# Patient Record
Sex: Male | Born: 1955 | Race: Black or African American | Hispanic: No | Marital: Married | Smoking: Former smoker
Health system: Southern US, Community
[De-identification: ages and names within clinical notes are randomized; demographics above are authoritative.]

## PROBLEM LIST (undated history)

## (undated) DIAGNOSIS — I10 Essential (primary) hypertension: Secondary | ICD-10-CM

## (undated) DIAGNOSIS — E7800 Pure hypercholesterolemia, unspecified: Secondary | ICD-10-CM

## (undated) HISTORY — PX: NO PAST SURGERIES: SHX2092

---

## 2010-01-11 ENCOUNTER — Emergency Department: Payer: Self-pay | Admitting: Emergency Medicine

## 2012-01-17 ENCOUNTER — Emergency Department: Payer: Self-pay | Admitting: *Deleted

## 2013-09-13 ENCOUNTER — Emergency Department: Payer: Self-pay | Admitting: Emergency Medicine

## 2013-09-13 LAB — CBC WITH DIFFERENTIAL/PLATELET
BASOS ABS: 0.1 10*3/uL (ref 0.0–0.1)
BASOS PCT: 1 %
EOS PCT: 1.5 %
Eosinophil #: 0.1 10*3/uL (ref 0.0–0.7)
HCT: 44.7 % (ref 40.0–52.0)
HGB: 14.8 g/dL (ref 13.0–18.0)
Lymphocyte #: 3.5 10*3/uL (ref 1.0–3.6)
Lymphocyte %: 46.9 %
MCH: 31 pg (ref 26.0–34.0)
MCHC: 33.1 g/dL (ref 32.0–36.0)
MCV: 94 fL (ref 80–100)
MONO ABS: 0.8 x10 3/mm (ref 0.2–1.0)
MONOS PCT: 10.3 %
Neutrophil #: 3 10*3/uL (ref 1.4–6.5)
Neutrophil %: 40.3 %
PLATELETS: 193 10*3/uL (ref 150–440)
RBC: 4.76 10*6/uL (ref 4.40–5.90)
RDW: 14.3 % (ref 11.5–14.5)
WBC: 7.3 10*3/uL (ref 3.8–10.6)

## 2013-09-13 LAB — BASIC METABOLIC PANEL
Anion Gap: 2 — ABNORMAL LOW (ref 7–16)
BUN: 16 mg/dL (ref 7–18)
CREATININE: 1.22 mg/dL (ref 0.60–1.30)
Calcium, Total: 9.1 mg/dL (ref 8.5–10.1)
Chloride: 105 mmol/L (ref 98–107)
Co2: 32 mmol/L (ref 21–32)
EGFR (African American): >60
EGFR (Non-African Amer.): >60
Glucose: 139 mg/dL — ABNORMAL HIGH (ref 65–99)
Osmolality: 281 (ref 275–301)
POTASSIUM: 4.4 mmol/L (ref 3.5–5.1)
Sodium: 139 mmol/L (ref 136–145)

## 2013-09-13 LAB — URINALYSIS, COMPLETE
BILIRUBIN, UR: NEGATIVE
BLOOD: NEGATIVE
Bacteria: NONE SEEN
Glucose,UR: NEGATIVE mg/dL (ref 0–75)
KETONE: NEGATIVE
Nitrite: NEGATIVE
PH: 6 (ref 4.5–8.0)
PROTEIN: NEGATIVE
SPECIFIC GRAVITY: 1.023 (ref 1.003–1.030)
Squamous Epithelial: 1

## 2013-09-13 LAB — TROPONIN I

## 2018-03-12 ENCOUNTER — Encounter: Payer: Self-pay | Admitting: Family Medicine

## 2018-03-12 ENCOUNTER — Ambulatory Visit (INDEPENDENT_AMBULATORY_CARE_PROVIDER_SITE_OTHER): Payer: Self-pay | Admitting: Family Medicine

## 2018-03-12 VITALS — BP 120/80 | HR 65 | Temp 98.4°F | Ht 67.0 in | Wt 211.8 lb

## 2018-03-12 DIAGNOSIS — M47816 Spondylosis without myelopathy or radiculopathy, lumbar region: Secondary | ICD-10-CM

## 2018-03-12 DIAGNOSIS — E669 Obesity, unspecified: Secondary | ICD-10-CM

## 2018-03-12 DIAGNOSIS — Z7689 Persons encountering health services in other specified circumstances: Secondary | ICD-10-CM

## 2018-03-12 DIAGNOSIS — N138 Other obstructive and reflux uropathy: Secondary | ICD-10-CM

## 2018-03-12 DIAGNOSIS — N401 Enlarged prostate with lower urinary tract symptoms: Secondary | ICD-10-CM

## 2018-03-12 DIAGNOSIS — I10 Essential (primary) hypertension: Secondary | ICD-10-CM

## 2018-03-12 MED ORDER — AMLODIPINE BESYLATE 10 MG PO TABS: 10.0000 mg | ORAL_TABLET | Freq: Every day | ORAL | 1 refills | Status: DC

## 2018-03-12 MED ORDER — TAMSULOSIN HCL 0.4 MG PO CAPS: 0.8000 mg | ORAL_CAPSULE | Freq: Every day | ORAL | 1 refills | Status: DC

## 2018-03-12 MED ORDER — HYDROCHLOROTHIAZIDE 25 MG PO TABS: 25.0000 mg | ORAL_TABLET | Freq: Every day | ORAL | 1 refills | Status: DC

## 2018-03-12 MED ORDER — MELOXICAM 15 MG PO TABS: 15.0000 mg | ORAL_TABLET | Freq: Every day | ORAL | 2 refills | Status: DC | PRN
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
## 2018-03-12 NOTE — Patient Instructions (Addendum)
Thank you for coming to the office today.  For prostate - go ahead and double Tamsulosin (Flomax) 0.4mg  take TWO capsules every day now at same time, new rx sent - if not effective after about a month then we may need to add 2nd medication to shrink prostate, after blood tests  Blood pressure is very good. Well controlled on current medicines - no change - refills sent to pharmacy  1. For your Back Pain - I think that this is due to Muscle Spasms or strain.  2. Start with anti-inflammatory Meloxicam 15g daily with food for next 1 to 2 weeks if helping, then can use only as needed 3.  Recommend to start taking Tylenol Extra Strength 500mg  tabs - take 1 to 2 tabs per dose (max 1000mg ) every 6-8 hours for pain (take regularly, don't skip a dose for next 7 days), max 24 hour daily dose is 6 tablets or 3000mg . In the future you can repeat the same everyday Tylenol course for 1-2 weeks at a time.  - This is safe to take with anti-inflammatory medicines (meloxicam)  - We may consider other meds in the future if needed - Muscle Relaxant or possibly Gabapentin which is a nerve pain or chronic pain medicine for longer term - Lastly we can refer you to Pain Management if need other more advanced treatments  Recommend to start using heating pad on your lower back 1-2x daily for few weeks  This pain may take weeks to months to fully resolve, but hopefully it will respond to the medicine initially. All back injuries (small or serious) are slow to heal since we use our back muscles every day. Be careful with turning, twisting, lifting, sitting / standing for prolonged periods, and avoid re-injury.  If your symptoms significantly worsen with more pain, or new symptoms with weakness in one or both legs, new or different shooting leg pains, numbness in legs or groin, loss of control or retention of urine or bowel movements, please call back for advice and you may need to go directly to the Emergency  Department.  DUE for FASTING BLOOD WORK (no food or drink after midnight before the lab appointment, only water or coffee without cream/sugar on the morning of)  SCHEDULE "Lab Only" visit in the morning at the clinic for lab draw in 3-4 WEEKS   - Make sure Lab Only appointment is at about 1 week before your next appointment, so that results will be available  For Lab Results, once available within 2-3 days of blood draw, you can can log in to MyChart online to view your results and a brief explanation. Also, we can discuss results at next follow-up visit.   Please schedule a Follow-up Appointment to: Return in about 4 weeks (around 04/09/2018) for HTN, back pain, lab results, BPH.  If you have any other questions or concerns, please feel free to call the office or send a message through [place]. You may also schedule an earlier appointment if necessary.  Additionally, you may be receiving a survey about your experience at our office within a few days to 1 week by e-mail or mail. We value your feedback.  Nobie Putnam, DO [place]

## 2018-03-13 ENCOUNTER — Other Ambulatory Visit: Payer: Self-pay | Admitting: Family Medicine

## 2018-03-13 ENCOUNTER — Encounter: Payer: Self-pay | Admitting: Family Medicine

## 2018-03-13 DIAGNOSIS — N401 Enlarged prostate with lower urinary tract symptoms: Secondary | ICD-10-CM

## 2018-03-13 DIAGNOSIS — E663 Overweight: Secondary | ICD-10-CM | POA: Insufficient documentation

## 2018-03-13 DIAGNOSIS — Z1159 Encounter for screening for other viral diseases: Secondary | ICD-10-CM

## 2018-03-13 DIAGNOSIS — I10 Essential (primary) hypertension: Secondary | ICD-10-CM

## 2018-03-13 DIAGNOSIS — N138 Other obstructive and reflux uropathy: Secondary | ICD-10-CM

## 2018-03-13 DIAGNOSIS — M47816 Spondylosis without myelopathy or radiculopathy, lumbar region: Secondary | ICD-10-CM | POA: Insufficient documentation

## 2018-03-13 DIAGNOSIS — R7309 Other abnormal glucose: Secondary | ICD-10-CM

## 2018-03-13 DIAGNOSIS — E669 Obesity, unspecified: Secondary | ICD-10-CM | POA: Insufficient documentation

## 2018-03-13 DIAGNOSIS — Z114 Encounter for screening for human immunodeficiency virus [HIV]: Secondary | ICD-10-CM

## 2018-03-13 NOTE — Assessment & Plan Note (Signed)
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 Suspect likely due to muscle spasm/strain, without known injury or trauma. In setting of known chronic LBP with DJD, without prior surgery. - No red flag symptoms - Inadequate conservative therapy currently. Prior history of controlled on opiate >20 + years prior PCP  Plan: 1. Discussed management of back pain and likely presumed dx osteoarthritis - will benefit from X-rays in future to determine clarity with diagnosis and check progress - Decline to start back on hydrocodone chronic opiates at this time - Start anti-inflammatory trial with rx Meloxicam 15mg  daily wc x 1-2 weeks, then PRN 2. May use Tylenol PRN for breakthrough 3. Encouraged use of heating pad 1-2x daily for now then PRN - Future options reviewed other alternative meds such as Muscle Relaxant, Gabapentin, and possibly Tramadol (he has failed this in past) 4. Follow-up 4 weeks if not improved for re-evaluation, consider X-ray imaging, trial of PT, and possibly referral to Orthopedic

## 2018-03-13 NOTE — Assessment & Plan Note (Signed)
Well-controlled HTN - Home BP readings normal  No known complications    Plan:  1. Continue current BP regimen - Amlodipine 10mg  daily, HCTZ 25mg  daily 2. Encourage improved lifestyle - low sodium diet, regular exercise 3. Continue monitor BP outside office, bring readings to next visit, if persistently >140/90 or new symptoms notify office sooner 4. Follow-up 4 weeks with labs

## 2018-03-13 NOTE — Assessment & Plan Note (Signed)
Gradual worsening chronic BPH LUTS - AUA BPH score 11 (moderate, 03/12/18)  - On Tamsulosin 0.4mg  - limited relief now - Last PSA unavailable - will re-check in 4 weeks - Last DRE reported by prior PCP enlarged - No known personal/family history of prostate CA  Plan: 1. INCREASE Tamsulosin to DOUBLE dose x 2 of 0.4mg  = 0.4mg  daily, advised on benefits, risks, if BP low caution with sudden standing up or position change 2. Follow-up 4 weeks - other alpha vs future add Finasteride alpha blocker to reduce prostate size (note will check PSA first and keep track of change in PSA), future referral to Urology if remains uncontrolled

## 2018-04-03 ENCOUNTER — Ambulatory Visit: Payer: Self-pay | Admitting: Family Medicine

## 2018-04-08 ENCOUNTER — Other Ambulatory Visit: Payer: Self-pay

## 2018-04-12 ENCOUNTER — Ambulatory Visit: Payer: Self-pay | Admitting: Family Medicine

## 2018-04-13 LAB — HEPATITIS C ANTIBODY
HEP C AB: NONREACTIVE
SIGNAL TO CUT-OFF: 0.02 (ref <1.00)

## 2018-04-13 LAB — COMPLETE METABOLIC PANEL WITH GFR
AG Ratio: 1.8 (calc) (ref 1.0–2.5)
ALBUMIN MSPROF: 4.3 g/dL (ref 3.6–5.1)
ALKALINE PHOSPHATASE (APISO): 60 U/L (ref 40–115)
ALT: 24 U/L (ref 9–46)
AST: 22 U/L (ref 10–35)
BILIRUBIN TOTAL: 0.3 mg/dL (ref 0.2–1.2)
BUN: 9 mg/dL (ref 7–25)
CHLORIDE: 105 mmol/L (ref 98–110)
CO2: 32 mmol/L (ref 20–32)
CREATININE: 1.05 mg/dL (ref 0.70–1.25)
Calcium: 9.3 mg/dL (ref 8.6–10.3)
GFR, Est African American: 87 mL/min/{1.73_m2} (ref >=60)
GFR, Est Non African American: 75 mL/min/{1.73_m2} (ref >=60)
Globulin: 2.4 g/dL (calc) (ref 1.9–3.7)
Glucose, Bld: 117 mg/dL — ABNORMAL HIGH (ref 65–99)
Potassium: 4.3 mmol/L (ref 3.5–5.3)
SODIUM: 142 mmol/L (ref 135–146)
Total Protein: 6.7 g/dL (ref 6.1–8.1)

## 2018-04-13 LAB — CBC WITH DIFFERENTIAL/PLATELET
BASOS PCT: 0.5 %
Basophils Absolute: 19 cells/uL (ref 0–200)
EOS PCT: 1.9 %
Eosinophils Absolute: 72 cells/uL (ref 15–500)
HCT: 41.3 % (ref 38.5–50.0)
Hemoglobin: 13.8 g/dL (ref 13.2–17.1)
LYMPHS ABS: 1448 {cells}/uL (ref 850–3900)
MCH: 29.7 pg (ref 27.0–33.0)
MCHC: 33.4 g/dL (ref 32.0–36.0)
MCV: 88.8 fL (ref 80.0–100.0)
MPV: 10.8 fL (ref 7.5–12.5)
Monocytes Relative: 10.3 %
NEUTROS PCT: 49.2 %
Neutro Abs: 1870 cells/uL (ref 1500–7800)
PLATELETS: 215 10*3/uL (ref 140–400)
RBC: 4.65 10*6/uL (ref 4.20–5.80)
RDW: 13.7 % (ref 11.0–15.0)
TOTAL LYMPHOCYTE: 38.1 %
WBC mixed population: 391 cells/uL (ref 200–950)
WBC: 3.8 10*3/uL (ref 3.8–10.8)

## 2018-04-13 LAB — LIPID PANEL
CHOL/HDL RATIO: 1.6 (calc) (ref <5.0)
Cholesterol: 102 mg/dL (ref <200)
HDL: 63 mg/dL (ref >40)
LDL Cholesterol (Calc): 25 mg/dL (calc)
Non-HDL Cholesterol (Calc): 39 mg/dL (calc) (ref <130)
TRIGLYCERIDES: 59 mg/dL (ref <150)

## 2018-04-13 LAB — HEMOGLOBIN A1C
EAG (MMOL/L): 7.9 (calc)
Hgb A1c MFr Bld: 6.6 % of total Hgb — ABNORMAL HIGH (ref <5.7)
MEAN PLASMA GLUCOSE: 143 (calc)

## 2018-04-13 LAB — HIV ANTIBODY (ROUTINE TESTING W REFLEX): HIV 1&2 Ab, 4th Generation: NONREACTIVE

## 2018-04-13 LAB — PSA: PSA: 2.8 ng/mL (ref <=4.0)

## 2018-04-29 ENCOUNTER — Encounter: Payer: Self-pay | Admitting: Family Medicine

## 2018-04-29 ENCOUNTER — Ambulatory Visit (INDEPENDENT_AMBULATORY_CARE_PROVIDER_SITE_OTHER): Payer: Self-pay | Admitting: Family Medicine

## 2018-04-29 VITALS — BP 138/74 | HR 57 | Temp 98.7°F | Resp 16 | Ht 67.0 in | Wt 224.6 lb

## 2018-04-29 DIAGNOSIS — G8929 Other chronic pain: Secondary | ICD-10-CM

## 2018-04-29 DIAGNOSIS — M47816 Spondylosis without myelopathy or radiculopathy, lumbar region: Secondary | ICD-10-CM

## 2018-04-29 DIAGNOSIS — N138 Other obstructive and reflux uropathy: Secondary | ICD-10-CM

## 2018-04-29 DIAGNOSIS — N401 Enlarged prostate with lower urinary tract symptoms: Secondary | ICD-10-CM

## 2018-04-29 DIAGNOSIS — I10 Essential (primary) hypertension: Secondary | ICD-10-CM

## 2018-04-29 DIAGNOSIS — E669 Obesity, unspecified: Secondary | ICD-10-CM

## 2018-04-29 DIAGNOSIS — M545 Low back pain, unspecified: Secondary | ICD-10-CM

## 2018-04-29 DIAGNOSIS — R7309 Other abnormal glucose: Secondary | ICD-10-CM

## 2018-04-29 DIAGNOSIS — Z23 Encounter for immunization: Secondary | ICD-10-CM

## 2018-04-29 MED ORDER — BACLOFEN 10 MG PO TABS: 5.0000 mg | ORAL_TABLET | Freq: Three times a day (TID) | ORAL | 2 refills | Status: DC | PRN

## 2018-04-29 MED ORDER — GABAPENTIN 100 MG PO CAPS: ORAL_CAPSULE | ORAL | 2 refills | Status: DC

## 2018-04-29 NOTE — Assessment & Plan Note (Signed)
Recent weight gain Encourage resume lifestyle improvement, diet / exercise

## 2018-04-29 NOTE — Assessment & Plan Note (Addendum)
Mild elevated BP, off meds recently - Home BP readings normal  No known complications    Plan:  1. Continue current BP regimen - Amlodipine 10mg  daily, HCTZ 25mg  daily 2. Encourage improved lifestyle - low sodium diet, regular exercise 3. Continue monitor BP outside office, bring readings to next visit, if persistently >140/90 or new symptoms notify office sooner  Future consider taper down thiazide vs CCB if well controlled, improved lifestyle and BP improve

## 2018-04-29 NOTE — Patient Instructions (Addendum)
Thank you for coming to the office today.  BP mildly elevated today off meds keep on track with BP meds, in future we can adjust  Try to check BP outside office if you can to monitor it.  Start Gabapentin 100mg  capsules, take at night for 2-3 nights only, and then increase to 2 times a day for a few days, and then may increase to 3 times a day, it may make you drowsy, if helps significantly at night only, then you can increase instead to 3 capsules at night, instead of 3 times a day - In the future if needed, we can significantly increase the dose if tolerated well, some common doses are 300mg  three times a day up to 600mg  three times a day, usually it takes several weeks or months to get to higher doses  Start taking Baclofen (Lioresal) 10mg  (muscle relaxant) - start with half (cut) to one whole pill at night as needed for next 1-3 nights (may make you drowsy, caution with driving) see how it affects you, then if tolerated increase to one pill 2 to 3 times a day or (every 8 hours as needed)  STOP Meloxicam if not helping.  Continue Tylenol high dose as you are.  Continue Flomax 0.8mg  daily (TWO tablets daily)  A1c 6.6, elevated sugar - as discussed very very important to control sugar through diet now for next few months to avoid becoming a new diabetic.  Please schedule a Follow-up Appointment to: Return in about 4 months (around 08/30/2018) for PreDM A1c, HTN, BPH, Back Pain.  If you have any other questions or concerns, please feel free to call the office or send a message through [place]. You may also schedule an earlier appointment if necessary.  Additionally, you may be receiving a survey about your experience at our office within a few days to 1 week by e-mail or mail. We value your feedback.  Nobie Putnam, DO [place]

## 2018-04-29 NOTE — Assessment & Plan Note (Signed)
Interval improved, chronic BPH LUTS - AUA BPH score 11 (02/2018) >> 9 (04/2018) - on higher dose - On Tamsulosin 0.8mg  - limited relief now - Last PSA 2.8 (04/2018) - Last DRE reported by prior PCP enlarged - No known personal/family history of prostate CA  Plan: 1. Continue current dose Tamsulosin 0.8mg  daily 2. Reviewed PSA stable 3. Follow-up symptoms in future - if needed we can reconsider finasteride vs other alpha blocker vs Urology
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
## 2018-04-29 NOTE — Assessment & Plan Note (Addendum)
Persistent Chronic LBP without associated sciatica. Suspect likely due to muscle spasm/strain, without known injury or trauma. In setting of known chronic LBP with DJD, without prior surgery. - No red flag symptoms - Inadequate conservative therapy currently. Prior history of controlled on opiate >20 + years prior PCP  Plan: 1. Trial of gabapentin, titration 100mg  up to 100 TID vs 300 QHS - likely need higher dose in future - Also start Baclofen 5-10mg  TID PRN review precaution sedation - DISCONTINUE vs HOLD Meloxicam 15mg  - since ineffective - Continue Tylenol PRN - Advise again restart chronic opiates - in future if limited options may reconsider refer to pain management - Follow-up within few months - may reconsider X-ray and Ortho referral vs PT if needed

## 2018-04-29 NOTE — Assessment & Plan Note (Signed)
Concern newly elevated A1c >6.5, now 6.6, no prior readings, but reported history PreDM Concern with obesity, HTN, HLD  Plan:  1. Not on any therapy currently - defer now given initial abnormal reading - will proceed with aggressive lifestyle intervention 2. Encourage improved lifestyle - low carb, low sugar diet, reduce portion size, start regular exercise - handout given glycemic index food choices 3. Follow-up 4 months PreDM A1c - if >6.5 then may be new dx T2DM

## 2019-06-15 ENCOUNTER — Emergency Department: Payer: Medicare Other

## 2019-06-15 ENCOUNTER — Other Ambulatory Visit: Payer: Self-pay

## 2019-06-15 ENCOUNTER — Emergency Department
Admission: EM | Admit: 2019-06-15 | Discharge: 2019-06-15 | Disposition: A | Discharge status: Home / Self Care | Payer: Medicare Other | Attending: Student | Admitting: Student

## 2019-06-15 DIAGNOSIS — S02832A Fracture of medial orbital wall, left side, initial encounter for closed fracture: Secondary | ICD-10-CM | POA: Diagnosis not present

## 2019-06-15 DIAGNOSIS — Y929 Unspecified place or not applicable: Secondary | ICD-10-CM | POA: Insufficient documentation

## 2019-06-15 DIAGNOSIS — Y9355 Activity, bike riding: Secondary | ICD-10-CM | POA: Insufficient documentation

## 2019-06-15 DIAGNOSIS — R000 Tachycardia, unspecified: Secondary | ICD-10-CM | POA: Diagnosis not present

## 2019-06-15 DIAGNOSIS — Z23 Encounter for immunization: Secondary | ICD-10-CM | POA: Insufficient documentation

## 2019-06-15 DIAGNOSIS — R609 Edema, unspecified: Secondary | ICD-10-CM | POA: Diagnosis not present

## 2019-06-15 DIAGNOSIS — Z87891 Personal history of nicotine dependence: Secondary | ICD-10-CM | POA: Insufficient documentation

## 2019-06-15 DIAGNOSIS — Z79899 Other long term (current) drug therapy: Secondary | ICD-10-CM | POA: Diagnosis not present

## 2019-06-15 DIAGNOSIS — Y990 Civilian activity done for income or pay: Secondary | ICD-10-CM | POA: Insufficient documentation

## 2019-06-15 DIAGNOSIS — S0990XA Unspecified injury of head, initial encounter: Secondary | ICD-10-CM | POA: Diagnosis present

## 2019-06-15 DIAGNOSIS — R0902 Hypoxemia: Secondary | ICD-10-CM | POA: Diagnosis not present

## 2019-06-15 MED ORDER — OXYCODONE-ACETAMINOPHEN 5-325 MG PO TABS: 1.0000 | ORAL_TABLET | Freq: Three times a day (TID) | ORAL | 0 refills | Status: AC | PRN

## 2019-06-15 MED ORDER — AMOXICILLIN-POT CLAVULANATE 875-125 MG PO TABS
1.0000 | ORAL_TABLET | Freq: Once | ORAL | Status: AC
  Administered 2019-06-15: 1 via ORAL
  Filled 2019-06-15: qty 1

## 2019-06-15 MED ORDER — AMOXICILLIN-POT CLAVULANATE 875-125 MG PO TABS: 1.0000 | ORAL_TABLET | Freq: Two times a day (BID) | ORAL | 0 refills | Status: AC

## 2019-06-15 MED ORDER — OXYCODONE HCL 5 MG PO TABS
5.0000 mg | ORAL_TABLET | Freq: Once | ORAL | Status: AC
  Administered 2019-06-15: 5 mg via ORAL
  Filled 2019-06-15: qty 1

## 2019-06-15 MED ORDER — PREDNISONE 20 MG PO TABS
40.0000 mg | ORAL_TABLET | Freq: Once | ORAL | Status: AC
  Administered 2019-06-15: 40 mg via ORAL
  Filled 2019-06-15: qty 2

## 2019-06-15 MED ORDER — PREDNISONE 20 MG PO TABS: 40.0000 mg | ORAL_TABLET | Freq: Every day | ORAL | 0 refills | Status: AC

## 2019-06-15 MED ORDER — TETANUS-DIPHTH-ACELL PERTUSSIS 5-2.5-18.5 LF-MCG/0.5 IM SUSP
0.5000 mL | Freq: Once | INTRAMUSCULAR | Status: AC
  Administered 2019-06-15: 0.5 mL via INTRAMUSCULAR
  Filled 2019-06-15: qty 0.5

## 2019-06-15 NOTE — ED Notes (Signed)
Pt ambulatory to restroom with steady gait noted  

## 2019-06-15 NOTE — Discharge Instructions (Addendum)
Thank you for letting us take care of you in the emergency department today.   Please continue to take any regular, prescribed medications.   New medications we have prescribed:  - Augmentin - antibiotic - Prednisone - steroids to help with swelling - Percocet - as needed for pain  Please follow up with: - The eye doctor. Call the number below to make an appointment.  Please return to the ER for any new or worsening symptoms.

## 2019-06-15 NOTE — ED Notes (Signed)
Pt resting on stretcher, no distress noted, using his cell phone, cont to monitor
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
## 2019-06-15 NOTE — ED Notes (Signed)
Pt taken to CT then xray
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
## 2019-07-28 DIAGNOSIS — H43811 Vitreous degeneration, right eye: Secondary | ICD-10-CM | POA: Diagnosis not present

## 2019-11-07 ENCOUNTER — Other Ambulatory Visit: Payer: Self-pay

## 2019-11-07 ENCOUNTER — Ambulatory Visit
Admission: RE | Admit: 2019-11-07 | Discharge: 2019-11-07 | Disposition: A | Discharge status: Home / Self Care | Payer: Medicare Other | Source: Ambulatory Visit | Attending: Family Medicine | Admitting: Family Medicine

## 2019-11-07 ENCOUNTER — Encounter: Payer: Self-pay | Admitting: Family Medicine

## 2019-11-07 ENCOUNTER — Ambulatory Visit (INDEPENDENT_AMBULATORY_CARE_PROVIDER_SITE_OTHER): Payer: Medicare Other | Admitting: Family Medicine

## 2019-11-07 VITALS — BP 140/71 | HR 74 | Temp 97.1°F | Ht 68.0 in | Wt 211.6 lb

## 2019-11-07 DIAGNOSIS — N50812 Left testicular pain: Secondary | ICD-10-CM

## 2019-11-07 DIAGNOSIS — N138 Other obstructive and reflux uropathy: Secondary | ICD-10-CM | POA: Diagnosis not present

## 2019-11-07 DIAGNOSIS — I10 Essential (primary) hypertension: Secondary | ICD-10-CM

## 2019-11-07 DIAGNOSIS — N50811 Right testicular pain: Secondary | ICD-10-CM | POA: Insufficient documentation

## 2019-11-07 DIAGNOSIS — N401 Enlarged prostate with lower urinary tract symptoms: Secondary | ICD-10-CM | POA: Diagnosis not present

## 2019-11-07 DIAGNOSIS — E7800 Pure hypercholesterolemia, unspecified: Secondary | ICD-10-CM

## 2019-11-07 MED ORDER — LOSARTAN POTASSIUM 50 MG PO TABS: 50.0000 mg | ORAL_TABLET | Freq: Every day | ORAL | 1 refills | Status: DC

## 2019-11-07 MED ORDER — FINASTERIDE 5 MG PO TABS: 5.0000 mg | ORAL_TABLET | Freq: Every day | ORAL | 1 refills | Status: DC

## 2019-11-07 MED ORDER — PRAVASTATIN SODIUM 40 MG PO TABS: 40.0000 mg | ORAL_TABLET | Freq: Every day | ORAL | 1 refills | Status: DC

## 2019-11-07 MED ORDER — AMLODIPINE BESYLATE 10 MG PO TABS: 10.0000 mg | ORAL_TABLET | Freq: Every day | ORAL | 1 refills | Status: DC
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
## 2019-11-07 NOTE — Progress Notes (Signed)
Subjective:    Patient ID: Nicholas Black, male    DOB: Feb 20, 1956, 64 y.o.   MRN: [id number]  Nicholas Black is a 64 y.o. male presenting on 11/07/2019 for Testicle Pain (Rt side testicle swelling and pain x 2 days. Pt admits that he's been doing a lot of bike riding. )   HPI  [name] presents to clinic for evaluation of right testicular pain since yesterday.  Reports has been riding a bike more, had some tingling in his testicles last week but had a sudden onset of right testicular pain that has not stopped since yesterday.  Denies fevers, abdominal pain, n/v/d, dysuria, urinary frequency/urgency/hesitancy/feelings of incomplete emptying, n/v/d.  Unsure what he was doing prior to increased pain onset yesterday.  Has not found anything that makes his symptoms better.   Depression screen [facility] 2/9 11/07/2019 04/29/2018 04/29/2018  Decreased Interest 0 0 0  Down, Depressed, Hopeless 0 0 0  PHQ - 2 Score 0 0 0    Social History   Tobacco Use  . Smoking status: Former Smoker    Packs/day: 1.00    Years: 20.00    Pack years: 20.00    Types: Cigarettes    Quit date: 2016    Years since quitting: 5.2  . Smokeless tobacco: Never Used  Substance Use Topics  . Alcohol use: Yes    Alcohol/week: 3.0 standard drinks    Types: 3 Cans of beer per week  . Drug use: Never    Review of Systems  Constitutional: Negative.   HENT: Negative.   Eyes: Negative.   Respiratory: Negative.   Cardiovascular: Negative.   Gastrointestinal: Negative.   Endocrine: Negative.   Genitourinary: Positive for scrotal swelling and testicular pain. Negative for decreased urine volume, difficulty urinating, discharge, dysuria, enuresis, flank pain, frequency, genital sores, hematuria, penile pain, penile swelling and urgency.  Musculoskeletal: Negative.   Skin: Negative.   Allergic/Immunologic: Negative.   Neurological: Negative.   Hematological: Negative.   Psychiatric/Behavioral: Negative.    Per HPI  unless specifically indicated above     Objective:    BP 140/71 (BP Location: Right Arm, Patient Position: Sitting, Cuff Size: Normal)   Pulse 74   Temp (!) 97.1 F (36.2 C) (Temporal)   Ht 5\' 8"  (1.727 m)   Wt 211 lb 9.6 oz (96 kg)   BMI 32.17 kg/m   Wt Readings from Last 3 Encounters:  11/07/19 211 lb 9.6 oz (96 kg)  06/15/19 212 lb (96.2 kg)  04/29/18 224 lb 9.6 oz (101.9 kg)    Physical Exam Vitals reviewed.  Constitutional:      General: He is not in acute distress.    Appearance: Normal appearance. He is not ill-appearing or toxic-appearing.  HENT:     Head: Normocephalic.  Eyes:     General:        Right eye: No discharge.        Left eye: No discharge.     Extraocular Movements: Extraocular movements intact.     Conjunctiva/sclera: Conjunctivae normal.     Pupils: Pupils are equal, round, and reactive to light.  Cardiovascular:     Rate and Rhythm: Normal rate and regular rhythm.     Pulses: Normal pulses.     Heart sounds: Normal heart sounds. No murmur. No friction rub. No gallop.   Pulmonary:     Effort: Pulmonary effort is normal. No respiratory distress.     Breath sounds: Normal breath sounds.  Genitourinary:    Pubic Area: No rash or pubic lice.      Penis: Normal.      Testes:        Right: Mass, tenderness or swelling not present. Right testis is descended.        Left: Tenderness and swelling present. Mass not present. Left testis is descended.     Epididymis:     Right: Normal.     Left: Inflamed. Not enlarged. Tenderness present. No mass.  Skin:    General: Skin is warm and dry.     Capillary Refill: Capillary refill takes less than 2 seconds.  Neurological:     General: No focal deficit present.     Mental Status: He is alert and oriented to person, place, and time.     Cranial Nerves: No cranial nerve deficit.     Sensory: No sensory deficit.     Motor: No weakness.     Coordination: Coordination normal.     Gait: Gait normal.    Psychiatric:        Attention and Perception: Attention and perception normal.        Mood and Affect: Mood and affect normal.        Speech: Speech normal.        Behavior: Behavior normal. Behavior is cooperative.        Thought Content: Thought content normal.        Cognition and Memory: Cognition and memory normal.     Results for orders placed or performed in visit on 04/08/18  Hepatitis C antibody  Result Value Ref Range   Hepatitis C Ab NON-REACTIVE NON-REACTI   SIGNAL TO CUT-OFF 0.02 <1.00  HIV antibody  Result Value Ref Range   HIV 1&2 Ab, 4th Generation NON-REACTIVE NON-REACTI  PSA  Result Value Ref Range   PSA 2.8 < OR = 4.0 ng/mL  Lipid panel  Result Value Ref Range   Cholesterol 102 <200 mg/dL   HDL 63 >40 mg/dL   Triglycerides 59 <150 mg/dL   LDL Cholesterol (Calc) 25 mg/dL (calc)   Total CHOL/HDL Ratio 1.6 <5.0 (calc)   Non-HDL Cholesterol (Calc) 39 <130 mg/dL (calc)  COMPLETE METABOLIC PANEL WITH GFR  Result Value Ref Range   Glucose, Bld 117 (H) 65 - 99 mg/dL   BUN 9 7 - 25 mg/dL   Creat 1.05 0.70 - 1.25 mg/dL   GFR, Est Non African American 75 > OR = 60 mL/min/1.67m2   GFR, Est African American 87 > OR = 60 mL/min/1.37m2   BUN/Creatinine Ratio NOT APPLICABLE 6 - 22 (calc)   Sodium 142 135 - 146 mmol/L   Potassium 4.3 3.5 - 5.3 mmol/L   Chloride 105 98 - 110 mmol/L   CO2 32 20 - 32 mmol/L   Calcium 9.3 8.6 - 10.3 mg/dL   Total Protein 6.7 6.1 - 8.1 g/dL   Albumin 4.3 3.6 - 5.1 g/dL   Globulin 2.4 1.9 - 3.7 g/dL (calc)   AG Ratio 1.8 1.0 - 2.5 (calc)   Total Bilirubin 0.3 0.2 - 1.2 mg/dL   Alkaline phosphatase (APISO) 60 40 - 115 U/L   AST 22 10 - 35 U/L   ALT 24 9 - 46 U/L  CBC with Differential/Platelet  Result Value Ref Range   WBC 3.8 3.8 - 10.8 Thousand/uL   RBC 4.65 4.20 - 5.80 Million/uL   Hemoglobin 13.8 13.2 - 17.1 g/dL   HCT 41.3 38.5 - 50.0 %  MCV 88.8 80.0 - 100.0 fL   MCH 29.7 27.0 - 33.0 pg   MCHC 33.4 32.0 - 36.0 g/dL   RDW  13.7 11.0 - 15.0 %   Platelets 215 140 - 400 Thousand/uL   MPV 10.8 7.5 - 12.5 fL   Neutro Abs 1,870 1,500 - 7,800 cells/uL   Lymphs Abs 1,448 850 - 3,900 cells/uL   WBC mixed population 391 200 - 950 cells/uL   Eosinophils Absolute 72 15 - 500 cells/uL   Basophils Absolute 19 0 - 200 cells/uL   Neutrophils Relative % 49.2 %   Total Lymphocyte 38.1 %   Monocytes Relative 10.3 %   Eosinophils Relative 1.9 %   Basophils Relative 0.5 %  Hemoglobin A1c  Result Value Ref Range   Hgb A1c MFr Bld 6.6 (H) <5.7 % of total Hgb   Mean Plasma Glucose 143 (calc)   eAG (mmol/L) 7.9 (calc)      Assessment & Plan:   Problem List Items Addressed This Visit      Cardiovascular and Mediastinum   Essential hypertension   Relevant Medications   BAYER ASPIRIN EC LOW DOSE 81 MG EC tablet   amLODipine (NORVASC) 10 MG tablet   losartan (COZAAR) 50 MG tablet   pravastatin (PRAVACHOL) 40 MG tablet     Genitourinary   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  Concerns for testicular torsion based on symptom history and physical exam.  Call placed to [facility] for on call provider guidance.  Spoke with front desk team lead that let FNP know that would need an ultrasound before would be able to provide guidance.  Ultrasound with doppler of scrotum ordered and patient to proceed to clinic for ultrasound and to wait until has been read by radiologist before deciding updated treatment plan.  Plan: 1. Proceed to [facility] for STAT Scrotal US with Doppler now and wait until results are received for next step in treatment plan.       Other Visit Diagnoses    Elevated cholesterol       Relevant Medications   BAYER ASPIRIN EC LOW DOSE 81 MG EC tablet   amLODipine (NORVASC) 10 MG tablet   losartan (COZAAR) 50 MG  tablet   pravastatin (PRAVACHOL) 40 MG tablet   Pain in both testicles       Relevant Orders   US Scrotum (Completed)   Pain in right testicle       Relevant Orders   US SCROTUM DOPPLER (Completed)      Meds ordered this encounter  Medications  . amLODipine (NORVASC) 10 MG tablet    Sig: Take 1 tablet (10 mg total) by mouth daily.    Dispense:  90 tablet    Refill:  1  . finasteride (PROSCAR) 5 MG tablet    Sig: Take 1 tablet (5 mg total) by mouth daily.    Dispense:  90 tablet    Refill:  1  . losartan (COZAAR) 50 MG tablet    Sig: Take 1 tablet (50 mg total) by mouth daily.    Dispense:  90 tablet    Refill:  1  . pravastatin (PRAVACHOL) 40 MG tablet    Sig: Take 1 tablet (40 mg total) by mouth daily.    Dispense:  90 tablet    Refill:  1   Follow up plan: Return in about 4 weeks (around 12/05/2019) for Physical.   Harlin Rain, [place] Nurse Practitioner [facility] 11/07/2019, 9:44 AM

## 2019-11-07 NOTE — Patient Instructions (Addendum)
As we discussed, will need an ultrasound on your scrotum to look for cause of your testicular pain and to rule out testicular torsion.  We have scheduled a STAT ultrasound with [facility].  We will have you go there for ultrasound and WAIT until the results have been reviewed and the radiologist contacts me for an update to know if we can wait to see Urology for referral or if you need to go to the ER for urgent evaluation.  Schedule an appointment for 1-2 months from now for your physical exam  You will receive a survey after today's visit either digitally by e-mail or paper by [place] mail. Your experiences and feedback matter to [place].  Please respond so we know how we are doing as we provide care for you.  Call us with any questions/concerns/needs.  It is my goal to be available to you for your health concerns.  Thanks for choosing me to be a partner in your healthcare needs!  Harlin Rain, FNP-C Family Nurse Practitioner [facility] Phone: [phone]

## 2019-11-07 NOTE — Assessment & Plan Note (Signed)
Right testicular pain since yesterday with sudden onset.  Concerns for testicular torsion based on symptom history and physical exam.  Call placed to [facility] for on call provider guidance.  Spoke with front desk team lead that let FNP know that would need an ultrasound before would be able to provide guidance.  Ultrasound with doppler of scrotum ordered and patient to proceed to clinic for ultrasound and to wait until has been read by radiologist before deciding updated treatment plan.  Plan: 1. Proceed to [place] for STAT Scrotal US with Doppler now and wait until results are received for next step in treatment plan.

## 2019-12-24 ENCOUNTER — Ambulatory Visit: Payer: Self-pay | Admitting: Pharmacist

## 2019-12-24 DIAGNOSIS — E7800 Pure hypercholesterolemia, unspecified: Secondary | ICD-10-CM

## 2019-12-24 DIAGNOSIS — I10 Essential (primary) hypertension: Secondary | ICD-10-CM

## 2019-12-24 NOTE — Progress Notes (Signed)
Chronic Care Management   Note  12/24/2019 Name: Nicholas Black MRN: [id number] DOB: [birth year]   Subjective:   Nicholas Black is a 64 y.o. year old male who is a primary care patient of Olin Hauser, DO. The CM team was consulted for assistance with chronic disease management and care coordination.   I reached out to [name] by phone today to discuss medication adherence to losartan and pravastatin.  Mr. Galindo was given information about Chronic Care Management services today including:  1. CCM service includes personalized support from designated clinical staff supervised by his physician, including individualized plan of care and coordination with other care providers 2. 24/7 contact phone numbers for assistance for urgent and routine care needs. 3. Service will only be billed when office clinical staff spend 20 minutes or more in a month to coordinate care. 4. Only one practitioner may furnish and bill the service in a calendar month. 5. The patient may stop CCM services at any time (effective at the end of the month) by phone call to the office staff. 6. The patient will be responsible for cost sharing (co-pay) of up to 20% of the service fee (after annual deductible is met).  Patient agreed to services and verbal consent obtained.   Review of patient status, including review of consultants reports, laboratory and other test data, was performed as part of comprehensive evaluation and provision of chronic care management services.    Objective:  Lab Results  Component Value Date   CREATININE 1.05 04/12/2018   CREATININE 1.22 09/13/2013    Lab Results  Component Value Date   HGBA1C 6.6 (H) 04/12/2018       Component Value Date/Time   CHOL 102 04/12/2018 0933   TRIG 59 04/12/2018 0933   HDL 63 04/12/2018 0933   CHOLHDL 1.6 04/12/2018 0933   LDLCALC 25 04/12/2018 0933    BP Readings from Last 3 Encounters:  11/07/19 140/71  06/15/19 130/72    04/29/18 138/74    No Known Allergies  Medications Reviewed Today    Reviewed by Verl Bangs, FNP (Family Nurse Practitioner) on 11/07/19 at 1136  Med List Status: <None>  Medication Order Taking? Sig Documenting Provider Last Dose Status Informant  amLODipine (NORVASC) 10 MG tablet [id number] Yes Take 1 tablet (10 mg total) by mouth daily. Malfi, Lupita Raider, FNP  Active   baclofen (LIORESAL) 10 MG tablet [id number] No Take 0.5-1 tablets (5-10 mg total) by mouth 3 (three) times daily as needed for muscle spasms.  Patient not taking: Reported on 11/07/2019   Olin Hauser, DO Not Taking Active   BAYER ASPIRIN EC LOW DOSE 81 MG EC tablet [id number] Yes  [provider] Taking Active   BLACK CURRANT SEED OIL PO [id number] No Take by mouth. [provider] Not Taking Active   finasteride (PROSCAR) 5 MG tablet [id number] Yes Take 1 tablet (5 mg total) by mouth daily. Malfi, Lupita Raider, FNP  Active   losartan (COZAAR) 50 MG tablet [id number] Yes Take 1 tablet (50 mg total) by mouth daily. Verl Bangs, FNP  Active   pravastatin (PRAVACHOL) 40 MG tablet [id number] Yes Take 1 tablet (40 mg total) by mouth daily. Verl Bangs, FNP  Active            Assessment:   Goals Addressed            This Visit's Progress   . PharmD - Medication Adherence  CARE PLAN ENTRY (see longitudinal plan of care for additional care plan information)   Current Barriers:  . Chronic Disease Management support, education, and care coordination needs related to HLD and BPH . Need for evaluation of medication adherence, as identified by patient's health plan, based on medication adherence quality data  Pharmacist Clinical Goal(s):  Marland Kitchen Over the next 30 days, patient will work with CM Pharmacist and PCP to address needs related to medication adherence  Interventions: . Inter-disciplinary care team collaboration (see longitudinal plan of care) . Perform chart review, based on  data from dispensing history, o Losartan 50 mg - last filled on 4/16 for 30 day supply o Pravastatin 40 mg - last filled on 4/16 for 30 day supply . Schedule appointment with patient to review medications  Patient Self Care Activities:  . Calls provider office for new concerns or questions  Initial goal documentation        Plan:  Telephone follow up appointment with care management team member scheduled for: 6/4 at 10:30 am  Harlow Asa, PharmD, [place] [phone]

## 2019-12-24 NOTE — Patient Instructions (Signed)
Thank you allowing the Chronic Care Management Team to be a part of your care! It was a pleasure speaking with you today!     CCM (Chronic Care Management) Team    Noreene Larsson RN, MSN, CCM Nurse Care Coordinator  [phone]   Harlow Asa PharmD  Clinical Pharmacist  [phone]   Eula Fried LCSW Clinical Social Worker [phone]  Visit Information  Goals Addressed            This Visit's Progress   . PharmD - Medication Adherence       CARE PLAN ENTRY (see longitudinal plan of care for additional care plan information)   Current Barriers:  . Chronic Disease Management support, education, and care coordination needs related to HLD and BPH . Need for evaluation of medication adherence, as identified by patient's health plan, based on medication adherence quality data  Pharmacist Clinical Goal(s):  Marland Kitchen Over the next 30 days, patient will work with CM Pharmacist and PCP to address needs related to medication adherence  Interventions: . Inter-disciplinary care team collaboration (see longitudinal plan of care) . Perform chart review, based on data from dispensing history, o Losartan 50 mg - last filled on 4/16 for 30 day supply o Pravastatin 40 mg - last filled on 4/16 for 30 day supply . Schedule appointment with patient to review medications  Patient Self Care Activities:  . Calls provider office for new concerns or questions  Initial goal documentation        Mr. Beilke was given information about Chronic Care Management services today including:  1. CCM service includes personalized support from designated clinical staff supervised by his physician, including individualized plan of care and coordination with other care providers 2. 24/7 contact phone numbers for assistance for urgent and routine care needs. 3. Service will only be billed when office clinical staff spend 20 minutes or more in a month to coordinate care. 4. Only one practitioner may  furnish and bill the service in a calendar month. 5. The patient may stop CCM services at any time (effective at the end of the month) by phone call to the office staff. 6. The patient will be responsible for cost sharing (co-pay) of up to 20% of the service fee (after annual deductible is met).  Patient agreed to services and verbal consent obtained.   Patient verbalizes understanding of instructions provided today.   Telephone follow up appointment with care management team member scheduled for: 6/4 at 10:30 am  Harlow Asa, PharmD, [place] [phone]

## 2019-12-26 ENCOUNTER — Ambulatory Visit (INDEPENDENT_AMBULATORY_CARE_PROVIDER_SITE_OTHER): Payer: Medicare Other | Admitting: Pharmacist

## 2019-12-26 ENCOUNTER — Other Ambulatory Visit: Payer: Self-pay | Admitting: Family Medicine

## 2019-12-26 DIAGNOSIS — N138 Other obstructive and reflux uropathy: Secondary | ICD-10-CM

## 2019-12-26 DIAGNOSIS — E7800 Pure hypercholesterolemia, unspecified: Secondary | ICD-10-CM

## 2019-12-26 DIAGNOSIS — N401 Enlarged prostate with lower urinary tract symptoms: Secondary | ICD-10-CM | POA: Diagnosis not present

## 2019-12-26 DIAGNOSIS — I10 Essential (primary) hypertension: Secondary | ICD-10-CM | POA: Diagnosis not present

## 2019-12-26 NOTE — Chronic Care Management (AMB) (Signed)
Chronic Care Management   Follow Up Note   12/26/2019 Name: Nicholas Black MRN: [id number] DOB: [birth year]  Referred by: Nicholas Hauser, DO Reason for referral : Chronic Care Management (Patient Phone Call)   Nicholas Black is a 64 y.o. year old male who is a primary care patient of Nicholas Hauser, DO. The CCM team was consulted for assistance with chronic disease management and care coordination needs.    I reached out to Nicholas Black by phone today.   Review of patient status, including review of consultants reports, relevant laboratory and other test results, and collaboration with appropriate care team members and the patient's provider was performed as part of comprehensive patient evaluation and provision of chronic care management services.    SDOH (Social Determinants of Health) screening performed today: Tobacco Use. See Care Plan for related entries.   Outpatient Encounter Medications as of 12/26/2019  Medication Sig  . BAYER ASPIRIN EC LOW DOSE 81 MG EC tablet   . Cholecalciferol (VITAMIN D HIGH POTENCY) 25 MCG (1000 UT) capsule Take 2,000 Units by mouth daily.  . finasteride (PROSCAR) 5 MG tablet Take 1 tablet (5 mg total) by mouth daily.  Marland Kitchen losartan (COZAAR) 50 MG tablet Take 1 tablet (50 mg total) by mouth daily.  . Omega-3 Fatty Acids (FISH OIL) 1000 MG CAPS Take by mouth daily.  . pravastatin (PRAVACHOL) 40 MG tablet Take 1 tablet (40 mg total) by mouth daily.  Marland Kitchen amLODipine (NORVASC) 10 MG tablet Take 1 tablet (10 mg total) by mouth daily. (Patient not taking: Reported on 12/26/2019)  . [DISCONTINUED] baclofen (LIORESAL) 10 MG tablet Take 0.5-1 tablets (5-10 mg total) by mouth 3 (three) times daily as needed for muscle spasms. (Patient not taking: Reported on 11/07/2019)  . [DISCONTINUED] BLACK CURRANT SEED OIL PO Take by mouth.   No facility-administered encounter medications on file as of 12/26/2019.    Goals Addressed            This Visit's  Progress   . PharmD - Medication Adherence       CARE PLAN ENTRY (see longitudinal plan of care for additional care plan information)   Current Barriers:  . Chronic Disease Management support, education, and care coordination needs related to HLD and BPH . Need for evaluation of medication adherence, as identified by patient's health plan, based on medication adherence quality data  Pharmacist Clinical Goal(s):  Marland Kitchen Over the next 30 days, patient will work with CM Pharmacist and PCP to address needs related to medication adherence  Interventions: . Inter-disciplinary care team collaboration (see longitudinal plan of care) . Comprehensive medication review performed; medication list updated in electronic medical record . Counsel on importance of medication adherence o From review of dispensing history in chart, note patient last filled both pravastatin 40 mg and losartan 50 mg on 4/16 for 30 day supplies.  - Patient confirms last filled date and reports has missed doses of both medications o Counsel patient on strategies and tools for aiding with medication adherence including timing of administration and use of weekly pillbox - Reports he will obtain and start using a weekly pillbox . Counsel on importance of blood pressure control and monitoring o Reports currently taking: losartan 50 mg once daily o Denies currently taking amlodipine 10 mg. Reports has been off of this medication for 3-4 months o Reports has home upper arm blood pressure monitor, but denies checking recently - Encourage patient to restart checking home blood pressure  and keeping log of results o Counsel on blood pressure monitoring technique . Will mail patient blood pressure log and education handout on blood pressure monitoring technique . Will collaborate with PCP  Patient Self Care Activities:  . Calls provider office for new concerns or questions  Please see past updates related to this goal by clicking on the  "Past Updates" button in the selected goal         Plan  Telephone follow up appointment with care management team member scheduled for: 6/16 at 8:30 am  Nicholas Black, PharmD, [place] [phone]

## 2019-12-26 NOTE — Patient Instructions (Signed)
Thank you allowing the Chronic Care Management Team to be a part of your care! It was a pleasure speaking with you today!     CCM (Chronic Care Management) Team    Noreene Larsson RN, MSN, CCM Nurse Care Coordinator  [phone]   Harlow Asa PharmD  Clinical Pharmacist  [phone]   Eula Fried LCSW Clinical Social Worker [phone]  Visit Information  Goals Addressed            This Visit's Progress    PharmD - Medication Adherence       CARE PLAN ENTRY (see longitudinal plan of care for additional care plan information)   Current Barriers:   Chronic Disease Management support, education, and care coordination needs related to HLD and BPH  Need for evaluation of medication adherence, as identified by patient's health plan, based on medication adherence quality data  Pharmacist Clinical Goal(s):   Over the next 30 days, patient will work with CM Pharmacist and PCP to address needs related to medication adherence  Interventions:  Inter-disciplinary care team collaboration (see longitudinal plan of care)  Comprehensive medication review performed; medication list updated in electronic medical record  Counsel on importance of medication adherence o From review of dispensing history in chart, note patient last filled both pravastatin 40 mg and losartan 50 mg on 4/16 for 30 day supplies.  - Patient confirms last filled date and reports has missed doses of both medications o Counsel patient on strategies and tools for aiding with medication adherence including timing of administration and use of weekly pillbox - Reports he will obtain and start using a weekly pillbox  Counsel on importance of blood pressure control and monitoring o Reports currently taking: losartan 50 mg once daily o Denies currently taking amlodipine 10 mg. Reports has been off of this medication for 3-4 months o Reports has home upper arm blood pressure monitor, but denies checking  recently - Encourage patient to restart checking home blood pressure and keeping log of results o Counsel on blood pressure monitoring technique  Will mail patient blood pressure log and education handout on blood pressure monitoring technique  Will collaborate with PCP  Patient Self Care Activities:   Calls provider office for new concerns or questions  Please see past updates related to this goal by clicking on the "Past Updates" button in the selected goal         Patient verbalizes understanding of instructions provided today.   Telephone follow up appointment with care management team member scheduled for: 6/16 at 8:30 am  Harlow Asa, PharmD, [place] [phone]

## 2020-01-07 ENCOUNTER — Ambulatory Visit: Payer: Self-pay | Admitting: Pharmacist

## 2020-01-07 ENCOUNTER — Telehealth: Payer: Self-pay | Admitting: Family Medicine

## 2020-01-07 DIAGNOSIS — E669 Obesity, unspecified: Secondary | ICD-10-CM

## 2020-01-07 DIAGNOSIS — N138 Other obstructive and reflux uropathy: Secondary | ICD-10-CM | POA: Diagnosis not present

## 2020-01-07 DIAGNOSIS — Z0000 Encounter for general adult medical examination without abnormal findings: Secondary | ICD-10-CM

## 2020-01-07 DIAGNOSIS — I10 Essential (primary) hypertension: Secondary | ICD-10-CM | POA: Diagnosis not present

## 2020-01-07 DIAGNOSIS — N401 Enlarged prostate with lower urinary tract symptoms: Secondary | ICD-10-CM

## 2020-01-07 DIAGNOSIS — R7309 Other abnormal glucose: Secondary | ICD-10-CM

## 2020-01-07 DIAGNOSIS — E7800 Pure hypercholesterolemia, unspecified: Secondary | ICD-10-CM | POA: Diagnosis not present

## 2020-01-07 NOTE — Telephone Encounter (Signed)
Signed orders  Nobie Putnam, [facility] 01/07/2020, 4:57 PM

## 2020-01-07 NOTE — Patient Instructions (Signed)
Thank you allowing the Chronic Care Management Team to be a part of your care! It was a pleasure speaking with you today!     CCM (Chronic Care Management) Team    Noreene Larsson RN, MSN, CCM Nurse Care Coordinator  [phone]   Harlow Asa PharmD  Clinical Pharmacist  [phone]   Eula Fried LCSW Clinical Social Worker [phone]  Visit Information  Goals Addressed            This Visit's Progress   . PharmD - Medication Adherence       CARE PLAN ENTRY (see longitudinal plan of care for additional care plan information)   Current Barriers:  . Chronic Disease Management support, education, and care coordination needs related to HLD and BPH . Need for evaluation of medication adherence, as identified by patient's health plan, based on medication adherence quality data  Pharmacist Clinical Goal(s):  Marland Kitchen Over the next 30 days, patient will work with CM Pharmacist and PCP to address needs related to medication adherence  Interventions: . Inter-disciplinary care team collaboration (see longitudinal plan of care) . Follow up with Mr. Klingbeil regarding medication adherence o Patient reports picked up refills of both pravastatin 40 mg and losartan 50 mg (Both for 30 day supplies on 6/14 per dispensing record) o Reports improved adherence to medications since adjusted timing of administration o Reports has not yet picked up a weekly pillbox, but is planning to obtain and start using one . Counsel on importance of blood pressure control and monitoring o Denies checking blood pressure recently - Counsel on rational for monitoring home BP even when not having symptoms - Encourage patient to restart checking home blood pressure and keeping log of results o Counsel on blood pressure monitoring technique . Coordination of care -collaborate with office to request outreach to patient to schedule his annual physical.  Patient Self Care Activities:  . Calls provider office for  new concerns or questions  Please see past updates related to this goal by clicking on the "Past Updates" button in the selected goal         Patient verbalizes understanding of instructions provided today.   Telephone follow up appointment with care management team member scheduled for: 7/7 at 8:30 am  Harlow Asa, PharmD, [place] [phone]

## 2020-01-07 NOTE — Chronic Care Management (AMB) (Signed)
  Chronic Care Management   Follow Up Note   01/07/2020 Name: Nicholas Black MRN: [id number] DOB: 1956-02-06  Referred by: Olin Hauser, DO Reason for referral : Chronic Care Management (Patient Phone Call)   Nicholas Black is a 64 y.o. year old male who is a primary care patient of Olin Hauser, DO. The CCM team was consulted for assistance with chronic disease management and care coordination needs.    I reached out to Nicholas Black by phone today.   Review of patient status, including review of consultants reports, relevant laboratory and other test results, and collaboration with appropriate care team members and the patient's provider was performed as part of comprehensive patient evaluation and provision of chronic care management services.    Outpatient Encounter Medications as of 01/07/2020  Medication Sig  . BAYER ASPIRIN EC LOW DOSE 81 MG EC tablet   . Cholecalciferol (VITAMIN D HIGH POTENCY) 25 MCG (1000 UT) capsule Take 2,000 Units by mouth daily.  . finasteride (PROSCAR) 5 MG tablet Take 1 tablet (5 mg total) by mouth daily.  Marland Kitchen losartan (COZAAR) 50 MG tablet Take 1 tablet (50 mg total) by mouth daily.  . Omega-3 Fatty Acids (FISH OIL) 1000 MG CAPS Take by mouth daily.  . pravastatin (PRAVACHOL) 40 MG tablet Take 1 tablet (40 mg total) by mouth daily.   No facility-administered encounter medications on file as of 01/07/2020.    Goals Addressed            This Visit's Progress   . PharmD - Medication Adherence       CARE PLAN ENTRY (see longitudinal plan of care for additional care plan information)   Current Barriers:  . Chronic Disease Management support, education, and care coordination needs related to HLD and BPH . Need for evaluation of medication adherence, as identified by patient's health plan, based on medication adherence quality data  Pharmacist Clinical Goal(s):  Marland Kitchen Over the next 30 days, patient will work with CM Pharmacist and  PCP to address needs related to medication adherence  Interventions: . Inter-disciplinary care team collaboration (see longitudinal plan of care) . Follow up with Nicholas Black regarding medication adherence o Patient reports picked up refills of both pravastatin 40 mg and losartan 50 mg (Both for 30 day supplies on 6/14 per dispensing record) o Reports improved adherence to medications since adjusted timing of administration o Reports has not yet picked up a weekly pillbox, but is planning to obtain and start using one . Counsel on importance of blood pressure control and monitoring o Denies checking blood pressure recently - Counsel on rational for monitoring home BP even when not having symptoms - Encourage patient to restart checking home blood pressure and keeping log of results o Counsel on blood pressure monitoring technique . Coordination of care -collaborate with office to request outreach to patient to schedule his annual physical.  Patient Self Care Activities:  . Calls provider office for new concerns or questions  Please see past updates related to this goal by clicking on the "Past Updates" button in the selected goal         Plan  Telephone follow up appointment with care management team member scheduled for: 7/7 at 8:30 am  Harlow Asa, PharmD, [place] [phone]

## 2020-01-09 ENCOUNTER — Other Ambulatory Visit: Payer: Self-pay

## 2020-01-09 ENCOUNTER — Other Ambulatory Visit: Payer: Medicare Other

## 2020-01-09 DIAGNOSIS — I10 Essential (primary) hypertension: Secondary | ICD-10-CM | POA: Diagnosis not present

## 2020-01-09 DIAGNOSIS — R7309 Other abnormal glucose: Secondary | ICD-10-CM

## 2020-01-09 DIAGNOSIS — Z0000 Encounter for general adult medical examination without abnormal findings: Secondary | ICD-10-CM

## 2020-01-09 DIAGNOSIS — E669 Obesity, unspecified: Secondary | ICD-10-CM

## 2020-01-09 DIAGNOSIS — N138 Other obstructive and reflux uropathy: Secondary | ICD-10-CM | POA: Diagnosis not present

## 2020-01-09 DIAGNOSIS — N401 Enlarged prostate with lower urinary tract symptoms: Secondary | ICD-10-CM

## 2020-01-10 LAB — CBC WITH DIFFERENTIAL/PLATELET
Absolute Monocytes: 466 cells/uL (ref 200–950)
Basophils Absolute: 20 cells/uL (ref 0–200)
Basophils Relative: 0.6 %
Eosinophils Absolute: 31 cells/uL (ref 15–500)
Eosinophils Relative: 0.9 %
HCT: 42.3 % (ref 38.5–50.0)
Hemoglobin: 14.2 g/dL (ref 13.2–17.1)
Lymphs Abs: 1333 cells/uL (ref 850–3900)
MCH: 29.7 pg (ref 27.0–33.0)
MCHC: 33.6 g/dL (ref 32.0–36.0)
MCV: 88.5 fL (ref 80.0–100.0)
MPV: 10.6 fL (ref 7.5–12.5)
Monocytes Relative: 13.7 %
Neutro Abs: 1550 cells/uL (ref 1500–7800)
Neutrophils Relative %: 45.6 %
Platelets: 210 10*3/uL (ref 140–400)
RBC: 4.78 10*6/uL (ref 4.20–5.80)
RDW: 14 % (ref 11.0–15.0)
Total Lymphocyte: 39.2 %
WBC: 3.4 10*3/uL — ABNORMAL LOW (ref 3.8–10.8)

## 2020-01-10 LAB — COMPLETE METABOLIC PANEL WITH GFR
AG Ratio: 1.7 (calc) (ref 1.0–2.5)
ALT: 22 U/L (ref 9–46)
AST: 26 U/L (ref 10–35)
Albumin: 4.5 g/dL (ref 3.6–5.1)
Alkaline phosphatase (APISO): 58 U/L (ref 35–144)
BUN: 19 mg/dL (ref 7–25)
CO2: 24 mmol/L (ref 20–32)
Calcium: 9.7 mg/dL (ref 8.6–10.3)
Chloride: 105 mmol/L (ref 98–110)
Creat: 1.17 mg/dL (ref 0.70–1.25)
GFR, Est African American: 75 mL/min/{1.73_m2} (ref >=60)
GFR, Est Non African American: 65 mL/min/{1.73_m2} (ref >=60)
Globulin: 2.6 g/dL (calc) (ref 1.9–3.7)
Glucose, Bld: 111 mg/dL — ABNORMAL HIGH (ref 65–99)
Potassium: 4.8 mmol/L (ref 3.5–5.3)
Sodium: 140 mmol/L (ref 135–146)
Total Bilirubin: 0.6 mg/dL (ref 0.2–1.2)
Total Protein: 7.1 g/dL (ref 6.1–8.1)

## 2020-01-10 LAB — LIPID PANEL
Cholesterol: 113 mg/dL (ref <200)
HDL: 75 mg/dL (ref >=40)
LDL Cholesterol (Calc): 25 mg/dL (calc)
Non-HDL Cholesterol (Calc): 38 mg/dL (calc) (ref <130)
Total CHOL/HDL Ratio: 1.5 (calc) (ref <5.0)
Triglycerides: 57 mg/dL (ref <150)

## 2020-01-10 LAB — HEMOGLOBIN A1C
Hgb A1c MFr Bld: 6.3 % of total Hgb — ABNORMAL HIGH (ref <5.7)
Mean Plasma Glucose: 134 (calc)
eAG (mmol/L): 7.4 (calc)

## 2020-01-10 LAB — PSA: PSA: 1.3 ng/mL (ref <=4.0)

## 2020-01-15 ENCOUNTER — Encounter: Payer: Self-pay | Admitting: Family Medicine

## 2020-01-15 ENCOUNTER — Ambulatory Visit (INDEPENDENT_AMBULATORY_CARE_PROVIDER_SITE_OTHER): Payer: Medicare Other | Admitting: Family Medicine

## 2020-01-15 ENCOUNTER — Other Ambulatory Visit: Payer: Self-pay

## 2020-01-15 VITALS — BP 108/55 | HR 65 | Temp 96.9°F | Resp 16 | Ht 68.0 in | Wt 207.0 lb

## 2020-01-15 DIAGNOSIS — Z23 Encounter for immunization: Secondary | ICD-10-CM

## 2020-01-15 DIAGNOSIS — R7303 Prediabetes: Secondary | ICD-10-CM | POA: Diagnosis not present

## 2020-01-15 DIAGNOSIS — E1169 Type 2 diabetes mellitus with other specified complication: Secondary | ICD-10-CM | POA: Insufficient documentation

## 2020-01-15 DIAGNOSIS — I10 Essential (primary) hypertension: Secondary | ICD-10-CM | POA: Diagnosis not present

## 2020-01-15 DIAGNOSIS — Z0000 Encounter for general adult medical examination without abnormal findings: Secondary | ICD-10-CM

## 2020-01-15 DIAGNOSIS — N401 Enlarged prostate with lower urinary tract symptoms: Secondary | ICD-10-CM

## 2020-01-15 DIAGNOSIS — E669 Obesity, unspecified: Secondary | ICD-10-CM

## 2020-01-15 DIAGNOSIS — N138 Other obstructive and reflux uropathy: Secondary | ICD-10-CM

## 2020-01-15 DIAGNOSIS — Z1211 Encounter for screening for malignant neoplasm of colon: Secondary | ICD-10-CM

## 2020-01-15 NOTE — Patient Instructions (Addendum)
Thank you for coming to the office today.  Due for initial pneumonia vaccine at age 64 - will receive Prevnar-13 today Due for 2nd pneumonia vaccine >1 year after previous vaccine, now to receive Pneumovax-23 today  ------------------------  Referral to Colonoscopy  [facility]) [place], [place] [address] Phone: [phone]  Lab tests are great  Recent Labs    01/09/20 1005  HGBA1C 6.3*    Sugar improved, in pre-diabetic range. - check every 6 months  Cholesterol is well controlled.  PSA prostate was normal without sign of prostate cancer, currently we can check yearly  If urinary symptoms do not improve - we can refer to Urologist for possible prostate procedure   Please schedule a Follow-up Appointment to: Return in about 6 months (around 07/16/2020) for 6 month Follow-up PreDM A1c, BPH, ?anxiety.  If you have any other questions or concerns, please feel free to call the office or send a message through [place]. You may also schedule an earlier appointment if necessary.  Additionally, you may be receiving a survey about your experience at our office within a few days to 1 week by e-mail or mail. We value your feedback.  Nobie Putnam, DO [place]

## 2020-01-15 NOTE — Progress Notes (Signed)
Subjective:    Patient ID: Nicholas Black, male    DOB: June 16, 1956, 64 y.o.   MRN: [id number]  Nicholas Black is a 64 y.o. male presenting on 01/15/2020 for Annual Exam   HPI   Here for Annual Physical and Lab Review.  Pre-Diabetes / Hyperglycemia A1c 6.6 last year down to 6.3 now Meds: None, never on Currently not on ACEi / ARB Lifestyle: - Diet (improving diet) - Exercise:Was doing exercise classes Zumba, now more bike riding. Improved lifestyle - bicycle riding 50-60 miles per week, and occasionally up to 100 miles Denies hypoglycemia  CHRONIC HTN: Reportshe has had been treated for HTN for past >4-5 years. No other medications Current Meds -Amlodipine10mg  daily, HCTZ 25mg  daily Reports good compliance, except recently did not fill meds - did not take for few days now Tolerating well, w/o complaints. Denies HA, chest pain, syncope, lightheaded, dizzy  BPH,LUTS Previously flomax 0.4 up to 0.8 daily, then switch to Finasteride 5mg  Last lab PSA down to 1.3, on finasteride Not seen Urologist Still has urinary frequency, urgency and nocturia  Admits some occasional anxiety type symptoms - related to not wanting to drive at times.  Health Maintenance: UTD Hep C and HIV screening negative  Colon CA Screening: Never had colonoscopy or screening. Currently asymptomatic. No known family history of colon CA. Due for screening test - referral to GI for colonoscopy   Prostate CA Screening: See above. PSA trend results below.  Due for initial pneumonia vaccine at age 56 - will receive Prevnar-13 today   Depression screen [facility] 2/9 01/15/2020 11/07/2019 04/29/2018  Decreased Interest 0 0 0  Down, Depressed, Hopeless 0 0 0  PHQ - 2 Score 0 0 0    No past medical history on file. No past surgical history on file. Social History   Socioeconomic History  . Marital status: Married    Spouse name: Not on file  . Number of children: 1  . Years of education: [employer]    . Highest education level: High school graduate  Occupational History  . Occupation: Self Employed Therapist, nutritional  Tobacco Use  . Smoking status: Former Smoker    Packs/day: 1.00    Years: 20.00    Pack years: 20.00    Types: Cigarettes    Quit date: 2016    Years since quitting: 5.4  . Smokeless tobacco: Former Network engineer  . Vaping Use: Never used  Substance and Sexual Activity  . Alcohol use: Yes    Alcohol/week: 3.0 standard drinks    Types: 3 Cans of beer per week  . Drug use: Never  . Sexual activity: Not on file  Other Topics Concern  . Not on file  Social History Narrative  . Not on file   Social Determinants of Health   Financial Resource Strain:   . Difficulty of Paying Living Expenses:   Food Insecurity:   . Worried About Charity fundraiser in the Last Year:   . Arboriculturist in the Last Year:   Transportation Needs:   . Film/video editor (Medical):   Nicholas Black Lack of Transportation (Non-Medical):   Physical Activity:   . Days of Exercise per Week:   . Minutes of Exercise per Session:   Stress:   . Feeling of Stress :   Social Connections:   . Frequency of Communication with Friends and Family:   . Frequency of Social Gatherings with Friends and Family:   .  Attends Religious Services:   . Active Member of Clubs or Organizations:   . Attends Archivist Meetings:   Nicholas Black Marital Status:   Intimate Partner Violence:   . Fear of Current or Ex-Partner:   . Emotionally Abused:   Nicholas Black Physically Abused:   . Sexually Abused:    Family History  Problem Relation Age of Onset  . Heart disease Mother    Current Outpatient Medications on File Prior to Visit  Medication Sig  . BAYER ASPIRIN EC LOW DOSE 81 MG EC tablet   . Cholecalciferol (VITAMIN D HIGH POTENCY) 25 MCG (1000 UT) capsule Take 2,000 Units by mouth daily.  . finasteride (PROSCAR) 5 MG tablet Take 1 tablet (5 mg total) by mouth daily.  Nicholas Black losartan (COZAAR) 50 MG tablet Take 1 tablet  (50 mg total) by mouth daily.  . Omega-3 Fatty Acids (FISH OIL) 1000 MG CAPS Take by mouth daily.  . pravastatin (PRAVACHOL) 40 MG tablet Take 1 tablet (40 mg total) by mouth daily.   No current facility-administered medications on file prior to visit.    Review of Systems  Constitutional: Negative for activity change, appetite change, chills, diaphoresis, fatigue and fever.  HENT: Negative for congestion and hearing loss.   Eyes: Negative for visual disturbance.  Respiratory: Negative for apnea, cough, chest tightness, shortness of breath and wheezing.   Cardiovascular: Negative for chest pain, palpitations and leg swelling.  Gastrointestinal: Negative for abdominal pain, anal bleeding, blood in stool, constipation, diarrhea, nausea and vomiting.  Endocrine: Negative for cold intolerance.  Genitourinary: Positive for frequency and urgency. Negative for difficulty urinating, dysuria and hematuria.  Musculoskeletal: Negative for arthralgias, back pain and neck pain.  Skin: Negative for rash.  Allergic/Immunologic: Negative for environmental allergies.  Neurological: Negative for dizziness, weakness, light-headedness, numbness and headaches.  Hematological: Negative for adenopathy.  Psychiatric/Behavioral: Negative for behavioral problems, decreased concentration, dysphoric mood and sleep disturbance. The patient is nervous/anxious.    Per HPI unless specifically indicated above      Objective:    BP (!) 108/55   Pulse 65   Temp (!) 96.9 F (36.1 C) (Temporal)   Resp 16   Ht 5\' 8"  (1.727 m)   Wt 207 lb (93.9 kg)   SpO2 99%   BMI 31.47 kg/m   Wt Readings from Last 3 Encounters:  01/15/20 207 lb (93.9 kg)  11/07/19 211 lb 9.6 oz (96 kg)  06/15/19 212 lb (96.2 kg)    Physical Exam Vitals and nursing note reviewed.  Constitutional:      General: He is not in acute distress.    Appearance: He is well-developed. He is not diaphoretic.     Comments: Well-appearing,  comfortable, cooperative  HENT:     Head: Normocephalic and atraumatic.  Eyes:     General:        Right eye: No discharge.        Left eye: No discharge.     Conjunctiva/sclera: Conjunctivae normal.     Pupils: Pupils are equal, round, and reactive to light.  Neck:     Thyroid: No thyromegaly.  Cardiovascular:     Rate and Rhythm: Normal rate and regular rhythm.     Heart sounds: Normal heart sounds. No murmur heard.   Pulmonary:     Effort: Pulmonary effort is normal. No respiratory distress.     Breath sounds: Normal breath sounds. No wheezing or rales.  Abdominal:     General: Bowel sounds are normal.  There is no distension.     Palpations: Abdomen is soft. There is no mass.     Tenderness: There is no abdominal tenderness.  Musculoskeletal:        General: No tenderness. Normal range of motion.     Cervical back: Normal range of motion and neck supple.     Comments: Upper / Lower Extremities: - Normal muscle tone, strength bilateral upper extremities 5/5, lower extremities 5/5  Lymphadenopathy:     Cervical: No cervical adenopathy.  Skin:    General: Skin is warm and dry.     Findings: No erythema or rash.  Neurological:     Mental Status: He is alert and oriented to person, place, and time.     Comments: Distal sensation intact to light touch all extremities  Psychiatric:        Behavior: Behavior normal.     Comments: Well groomed, good eye contact, normal speech and thoughts        Results for orders placed or performed in visit on 01/09/20  PSA  Result Value Ref Range   PSA 1.3 < OR = 4.0 ng/mL  Lipid panel  Result Value Ref Range   Cholesterol 113 <200 mg/dL   HDL 75 > OR = 40 mg/dL   Triglycerides 57 <150 mg/dL   LDL Cholesterol (Calc) 25 mg/dL (calc)   Total CHOL/HDL Ratio 1.5 <5.0 (calc)   Non-HDL Cholesterol (Calc) 38 <130 mg/dL (calc)  Hemoglobin A1c  Result Value Ref Range   Hgb A1c MFr Bld 6.3 (H) <5.7 % of total Hgb   Mean Plasma Glucose 134  (calc)   eAG (mmol/L) 7.4 (calc)  CBC with Differential/Platelet  Result Value Ref Range   WBC 3.4 (L) 3.8 - 10.8 Thousand/uL   RBC 4.78 4.20 - 5.80 Million/uL   Hemoglobin 14.2 13.2 - 17.1 g/dL   HCT 42.3 38 - 50 %   MCV 88.5 80.0 - 100.0 fL   MCH 29.7 27.0 - 33.0 pg   MCHC 33.6 32.0 - 36.0 g/dL   RDW 14.0 11.0 - 15.0 %   Platelets 210 140 - 400 Thousand/uL   MPV 10.6 7.5 - 12.5 fL   Neutro Abs 1,550 1,500 - 7,800 cells/uL   Lymphs Abs 1,333 850 - 3,900 cells/uL   Absolute Monocytes 466 200 - 950 cells/uL   Eosinophils Absolute 31 15 - 500 cells/uL   Basophils Absolute 20 0 - 200 cells/uL   Neutrophils Relative % 45.6 %   Total Lymphocyte 39.2 %   Monocytes Relative 13.7 %   Eosinophils Relative 0.9 %   Basophils Relative 0.6 %  COMPLETE METABOLIC PANEL WITH GFR  Result Value Ref Range   Glucose, Bld 111 (H) 65 - 99 mg/dL   BUN 19 7 - 25 mg/dL   Creat 1.17 0.70 - 1.25 mg/dL   GFR, Est Non African American 65 > OR = 60 mL/min/1.84m2   GFR, Est African American 75 > OR = 60 mL/min/1.53m2   BUN/Creatinine Ratio NOT APPLICABLE 6 - 22 (calc)   Sodium 140 135 - 146 mmol/L   Potassium 4.8 3.5 - 5.3 mmol/L   Chloride 105 98 - 110 mmol/L   CO2 24 20 - 32 mmol/L   Calcium 9.7 8.6 - 10.3 mg/dL   Total Protein 7.1 6.1 - 8.1 g/dL   Albumin 4.5 3.6 - 5.1 g/dL   Globulin 2.6 1.9 - 3.7 g/dL (calc)   AG Ratio 1.7 1.0 - 2.5 (calc)  Total Bilirubin 0.6 0.2 - 1.2 mg/dL   Alkaline phosphatase (APISO) 58 35 - 144 U/L   AST 26 10 - 35 U/L   ALT 22 9 - 46 U/L      Assessment & Plan:   Problem List Items Addressed This Visit    Pre-diabetes    Elevated A1c 6.3, improved from last 6.6 Concern with obesity, HTN  Plan:  1. Not on any therapy currently  2. Encourage improved lifestyle - low carb, low sugar diet, reduce portion size, continue improving regular exercise 3. Follow-up 6 months A1c       Obesity (BMI 30.0-34.9)    Improved weight Lifestyle diet exercise wt loss       Essential hypertension    Well controlled HTN Limited outside readings. No known complications    Plan:  1. Continue current BP regimen - Amlodipine 10mg  daily, HCTZ 25mg  daily 2. Encourage improved lifestyle - low sodium diet, regular exercise 3. Continue monitor BP outside office, bring readings to next visit, if persistently >140/90 or new symptoms notify office sooner  Future consider taper down thiazide vs CCB if well controlled, improved lifestyle and BP improve      BPH with obstruction/lower urinary tract symptoms    Stable chronic BPH LUTS - AUA BPH score has been stable, mild-moderate, reviewed prior score OFF Tamsulosin - Last PSA 1.3 (12/2019) down from last 2.8 (04/2018) due to Finasteride - Last DRE reported by prior PCP enlarged - No known personal/family history of prostate CA  Plan: 1. Continue current dose Finasteride 5mg  daily 2. Reviewed PSA stable (adjusted for finasteride) 3. Follow-up symptoms in future  May consider reduce Thiazide for HTN can reduce urinary frequency       Other Visit Diagnoses    Annual physical exam    -  Primary   Screening for colon cancer       Relevant Orders   Ambulatory referral to Gastroenterology   Need for vaccination with 13-polyvalent pneumococcal conjugate vaccine       Relevant Orders   Pneumococcal conjugate vaccine 13-valent IM (Completed)      Updated Health Maintenance information - PNA Vaccine Prevnar13 given today, age 69. Next due 1 year for Pneumovax23 - Referral to GI for Colonoscopy (initial screening) See referral for more info. - Prostate CA Screening, reviewed PSA today. Negative. Check yearly. - UTD COVID19 vaccine  Reviewed recent lab results with patient Encouraged improvement to lifestyle with diet and exercise Goal of weight loss  Not focus of visit today Briefly reviewed possible anxiety vs even possible mild hypoglycemic symptoms - seems more anxiety given related to feeling of not want to  drive at times. Discussed stress / anxiety management briefly and reviewed that we can consider medication in future if he has significant anxiety symptoms.   Orders Placed This Encounter  Procedures  . Pneumococcal conjugate vaccine 13-valent IM  . Ambulatory referral to Gastroenterology    Referral Priority:   Routine    Referral Type:   Consultation    Referral Reason:   Specialty Services Required    Number of Visits Requested:   1     No orders of the defined types were placed in this encounter.     Follow up plan: Return in about 6 months (around 07/16/2020) for 6 month Follow-up PreDM A1c, BPH, ?anxiety.    Nobie Putnam, [facility] 01/15/2020, 9:14 AM

## 2020-01-15 NOTE — Assessment & Plan Note (Signed)
Elevated A1c 6.3, improved from last 6.6 Concern with obesity, HTN  Plan:  1. Not on any therapy currently  2. Encourage improved lifestyle - low carb, low sugar diet, reduce portion size, continue improving regular exercise 3. Follow-up 6 months A1c

## 2020-01-15 NOTE — Assessment & Plan Note (Signed)
Improved weight Lifestyle diet exercise wt loss

## 2020-01-15 NOTE — Assessment & Plan Note (Signed)
Stable chronic BPH LUTS - AUA BPH score has been stable, mild-moderate, reviewed prior score OFF Tamsulosin - Last PSA 1.3 (12/2019) down from last 2.8 (04/2018) due to Finasteride - Last DRE reported by prior PCP enlarged - No known personal/family history of prostate CA  Plan: 1. Continue current dose Finasteride 5mg  daily 2. Reviewed PSA stable (adjusted for finasteride) 3. Follow-up symptoms in future  May consider reduce Thiazide for HTN can reduce urinary frequency

## 2020-01-15 NOTE — Assessment & Plan Note (Signed)
Well controlled HTN Limited outside readings. No known complications    Plan:  1. Continue current BP regimen - Amlodipine 10mg  daily, HCTZ 25mg  daily 2. Encourage improved lifestyle - low sodium diet, regular exercise 3. Continue monitor BP outside office, bring readings to next visit, if persistently >140/90 or new symptoms notify office sooner  Future consider taper down thiazide vs CCB if well controlled, improved lifestyle and BP improve

## 2020-01-22 ENCOUNTER — Telehealth: Payer: Self-pay

## 2020-01-22 NOTE — Telephone Encounter (Signed)
Left message with patients wife to have patient call the office back to reschedule nurse visit with Sharyn Lull to schedule colonoscopy.  Thanks, [place], [place]

## 2020-01-23 ENCOUNTER — Telehealth: Payer: Medicare Other

## 2020-01-28 ENCOUNTER — Ambulatory Visit: Payer: Self-pay | Admitting: Pharmacist

## 2020-01-28 DIAGNOSIS — I10 Essential (primary) hypertension: Secondary | ICD-10-CM

## 2020-01-28 NOTE — Patient Instructions (Signed)
Thank you allowing the Chronic Care Management Team to be a part of your care! It was a pleasure speaking with you today!     CCM (Chronic Care Management) Team    Noreene Larsson RN, MSN, CCM Nurse Care Coordinator  [phone]   Harlow Asa PharmD  Clinical Pharmacist  [phone]   Eula Fried LCSW Clinical Social Worker [phone]  Visit Information  Goals Addressed            This Visit's Progress   . PharmD - Medication Adherence       CARE PLAN ENTRY (see longitudinal plan of care for additional care plan information)   Current Barriers:  . Chronic Disease Management support, education, and care coordination needs related to HLD and BPH . Need for evaluation of medication adherence, as identified by patient's health plan, based on medication adherence quality data  Pharmacist Clinical Goal(s):  Marland Kitchen Over the next 30 days, patient will work with CM Pharmacist and PCP to address needs related to medication adherence  Interventions: . Perform chart review . Follow up with [name] regarding medication adherence o Reports has not yet picked up a weekly pillbox, but is planning to obtain and start using one . Reschedule appointment to further discuss BP control and monitoring - as patient not currently home/does not have readings with him.  Patient Self Care Activities:  . Calls provider office for new concerns or questions  Please see past updates related to this goal by clicking on the "Past Updates" button in the selected goal         Patient verbalizes understanding of instructions provided today.   The care management team will reach out to the patient again over the next 14 days.   Harlow Asa, PharmD, [place] [employer] [phone]

## 2020-01-28 NOTE — Chronic Care Management (AMB) (Signed)
  Chronic Care Management   Follow Up Note   01/28/2020 Name: COALTON ARCH MRN: [id number] DOB: 1956-03-30  Referred by: Olin Hauser, DO Reason for referral : Chronic Care Management (Patient Phone Call)   ESTIVEN KOHAN is a 64 y.o. year old male who is a primary care patient of Olin Hauser, DO. The CCM team was consulted for assistance with chronic disease management and care coordination needs.    I reached out to [name] by phone today.   Review of patient status, including review of consultants reports, relevant laboratory and other test results, and collaboration with appropriate care team members and the patient's provider was performed as part of comprehensive patient evaluation and provision of chronic care management services.   .   Outpatient Encounter Medications as of 01/28/2020  Medication Sig  . BAYER ASPIRIN EC LOW DOSE 81 MG EC tablet   . Cholecalciferol (VITAMIN D HIGH POTENCY) 25 MCG (1000 UT) capsule Take 2,000 Units by mouth daily.  . finasteride (PROSCAR) 5 MG tablet Take 1 tablet (5 mg total) by mouth daily.  Marland Kitchen losartan (COZAAR) 50 MG tablet Take 1 tablet (50 mg total) by mouth daily.  . Omega-3 Fatty Acids (FISH OIL) 1000 MG CAPS Take by mouth daily.  . pravastatin (PRAVACHOL) 40 MG tablet Take 1 tablet (40 mg total) by mouth daily.   No facility-administered encounter medications on file as of 01/28/2020.    Goals Addressed            This Visit's Progress   . PharmD - Medication Adherence       CARE PLAN ENTRY (see longitudinal plan of care for additional care plan information)   Current Barriers:  . Chronic Disease Management support, education, and care coordination needs related to HLD and BPH . Need for evaluation of medication adherence, as identified by patient's health plan, based on medication adherence quality data  Pharmacist Clinical Goal(s):  Marland Kitchen Over the next 30 days, patient will work with CM Pharmacist  and PCP to address needs related to medication adherence  Interventions: . Perform chart review . Follow up with Mr. Mcleary regarding medication adherence o Reports has not yet picked up a weekly pillbox, but is planning to obtain and start using one . Reschedule appointment to further discuss BP control and monitoring - as patient not currently home/does not have readings with him.  Patient Self Care Activities:  . Calls provider office for new concerns or questions  Please see past updates related to this goal by clicking on the "Past Updates" button in the selected goal         Plan  The care management team will reach out to the patient again over the next 14 days.   Harlow Asa, PharmD, [place] [employer] [phone]

## 2020-02-02 ENCOUNTER — Other Ambulatory Visit: Payer: Self-pay

## 2020-02-02 ENCOUNTER — Telehealth (INDEPENDENT_AMBULATORY_CARE_PROVIDER_SITE_OTHER): Payer: Self-pay | Admitting: Gastroenterology

## 2020-02-02 ENCOUNTER — Ambulatory Visit: Payer: Self-pay | Admitting: Pharmacist

## 2020-02-02 ENCOUNTER — Other Ambulatory Visit (INDEPENDENT_AMBULATORY_CARE_PROVIDER_SITE_OTHER): Payer: Self-pay

## 2020-02-02 DIAGNOSIS — Z1211 Encounter for screening for malignant neoplasm of colon: Secondary | ICD-10-CM

## 2020-02-02 DIAGNOSIS — I10 Essential (primary) hypertension: Secondary | ICD-10-CM

## 2020-02-02 MED ORDER — NA SULFATE-K SULFATE-MG SULF 17.5-3.13-1.6 GM/177ML PO SOLN: 1.0000 | Freq: Once | ORAL | 0 refills | Status: AC

## 2020-02-02 MED ORDER — PEG 3350-KCL-NA BICARB-NACL 420 G PO SOLR
ORAL | 0 refills | Status: DC
Start: 2020-02-02 — End: 2023-01-19

## 2020-02-02 MED ORDER — CLENPIQ 10-3.5-12 MG-GM -GM/160ML PO SOLN
ORAL | 0 refills | Status: DC
Start: 2020-02-02 — End: 2022-10-20

## 2020-02-02 MED ORDER — SUTAB 1479-225-188 MG PO TABS
ORAL_TABLET | ORAL | 0 refills | Status: DC
Start: 2020-02-02 — End: 2023-01-19

## 2020-02-02 NOTE — Patient Instructions (Signed)
Thank you allowing the Chronic Care Management Team to be a part of your care! It was a pleasure speaking with you today!     CCM (Chronic Care Management) Team    Noreene Larsson RN, MSN, CCM Nurse Care Coordinator  [phone]   Harlow Asa PharmD  Clinical Pharmacist  [phone]   Eula Fried LCSW Clinical Social Worker [phone]  Visit Information  Goals Addressed            This Visit's Progress   . PharmD - Medication Adherence       CARE PLAN ENTRY (see longitudinal plan of care for additional care plan information)   Current Barriers:  . Chronic Disease Management support, education, and care coordination needs related to HLD and BPH . Need for evaluation of medication adherence, as identified by patient's health plan, based on medication adherence quality data  Pharmacist Clinical Goal(s):  Marland Kitchen Over the next 30 days, patient will work with CM Pharmacist and PCP to address needs related to medication adherence  Interventions: . Encourage patient in positive dietary and exercise changes he has been making. Reports since cutting down on fried foods, his weight is down to 201 lbs . Follow up with Mr. Herren regarding medication adherence o Again encourage patient to obtain a weekly pillbox. States that he will do so this week. . Patient asks to reschedule appointment to review medications and BP monitoring as he does not have his medications or log with him currently  Patient Self Care Activities:  . Calls provider office for new concerns or questions  Please see past updates related to this goal by clicking on the "Past Updates" button in the selected goal         Patient verbalizes understanding of instructions provided today.   Telephone follow up appointment with care management team member scheduled for: 7/19 at noon   Harlow Asa, PharmD, [place] [phone]

## 2020-02-02 NOTE — Progress Notes (Signed)
Gastroenterology Pre-Procedure Review  Request Date: Tuesday 02/10/20 Requesting Physician: Dr. Bonna Gains  PATIENT REVIEW QUESTIONS: The patient responded to the following health history questions as indicated:    1. Are you having any GI issues? no 2. Do you have a personal history of Polyps? no 3. Do you have a family history of Colon Cancer or Polyps? no 4. Diabetes Mellitus? no 5. Joint replacements in the past 12 months?no 6. Major health problems in the past 3 months?no 7. Any artificial heart valves, MVP, or defibrillator?no    MEDICATIONS & ALLERGIES:    Patient reports the following regarding taking any anticoagulation/antiplatelet therapy:   Plavix, Coumadin, Eliquis, Xarelto, Lovenox, Pradaxa, Brilinta, or Effient? no Aspirin? no  Patient confirms/reports the following medications:  Current Outpatient Medications  Medication Sig Dispense Refill  . amLODipine (NORVASC) 10 MG tablet Take 10 mg by mouth daily.    Marland Kitchen BAYER ASPIRIN EC LOW DOSE 81 MG EC tablet     . Cholecalciferol (VITAMIN D HIGH POTENCY) 25 MCG (1000 UT) capsule Take 2,000 Units by mouth daily.    . finasteride (PROSCAR) 5 MG tablet Take 1 tablet (5 mg total) by mouth daily. 90 tablet 1  . losartan (COZAAR) 50 MG tablet Take 1 tablet (50 mg total) by mouth daily. 90 tablet 1  . Na Sulfate-K Sulfate-Mg Sulf 17.5-3.13-1.6 GM/177ML SOLN Take 1 kit by mouth once for 1 dose. 354 mL 0  . Omega-3 Fatty Acids (FISH OIL) 1000 MG CAPS Take by mouth daily.    . pravastatin (PRAVACHOL) 40 MG tablet Take 1 tablet (40 mg total) by mouth daily. 90 tablet 1   No current facility-administered medications for this visit.    Patient confirms/reports the following allergies:  No Known Allergies  No orders of the defined types were placed in this encounter.   AUTHORIZATION INFORMATION Primary Insurance: 1D#: Group #:  Secondary Insurance: 1D#: Group #:  SCHEDULE INFORMATION: Date: 07/20/21Time: Location:[facility]

## 2020-02-02 NOTE — Chronic Care Management (AMB) (Signed)
  Chronic Care Management   Follow Up Note   02/02/2020 Name: SAIF PETER MRN: [id number] DOB: [birth year]  Referred by: Olin Hauser, DO Reason for referral : Chronic Care Management (Patient Phone Call)   MAKSYM PFIFFNER is a 64 y.o. year old male who is a primary care patient of Olin Hauser, DO. The CCM team was consulted for assistance with chronic disease management and care coordination needs.    I reached out to [name] by phone today.   Review of patient status, including review of consultants reports, relevant laboratory and other test results, and collaboration with appropriate care team members and the patient's provider was performed as part of comprehensive patient evaluation and provision of chronic care management services.     Outpatient Encounter Medications as of 02/02/2020  Medication Sig  . amLODipine (NORVASC) 10 MG tablet Take 10 mg by mouth daily.  Marland Kitchen BAYER ASPIRIN EC LOW DOSE 81 MG EC tablet   . Cholecalciferol (VITAMIN D HIGH POTENCY) 25 MCG (1000 UT) capsule Take 2,000 Units by mouth daily.  . finasteride (PROSCAR) 5 MG tablet Take 1 tablet (5 mg total) by mouth daily.  Marland Kitchen losartan (COZAAR) 50 MG tablet Take 1 tablet (50 mg total) by mouth daily.  . Omega-3 Fatty Acids (FISH OIL) 1000 MG CAPS Take by mouth daily.  . pravastatin (PRAVACHOL) 40 MG tablet Take 1 tablet (40 mg total) by mouth daily.   No facility-administered encounter medications on file as of 02/02/2020.    Goals Addressed            This Visit's Progress   . PharmD - Medication Adherence       CARE PLAN ENTRY (see longitudinal plan of care for additional care plan information)   Current Barriers:  . Chronic Disease Management support, education, and care coordination needs related to HLD and BPH . Need for evaluation of medication adherence, as identified by patient's health plan, based on medication adherence quality data  Pharmacist Clinical Goal(s):    Marland Kitchen Over the next 30 days, patient will work with CM Pharmacist and PCP to address needs related to medication adherence  Interventions: . Encourage patient in positive dietary and exercise changes he has been making. Reports since cutting down on fried foods, his weight is down to 201 lbs . Follow up with Mr. Erion regarding medication adherence o Again encourage patient to obtain a weekly pillbox. States that he will do so this week. . Patient asks to reschedule appointment to review medications and BP monitoring as he does not have his medications or log with him currently  Patient Self Care Activities:  . Calls provider office for new concerns or questions  Please see past updates related to this goal by clicking on the "Past Updates" button in the selected goal         Plan  Telephone follow up appointment with care management team member scheduled for: 7/19 at noon  Harlow Asa, PharmD, [place] [phone]

## 2020-02-03 NOTE — Progress Notes (Signed)
[place] note.

## 2020-02-06 ENCOUNTER — Other Ambulatory Visit
Admission: RE | Admit: 2020-02-06 | Discharge: 2020-02-06 | Disposition: A | Discharge status: Home / Self Care | Payer: Medicare Other | Source: Ambulatory Visit | Attending: Gastroenterology | Admitting: Gastroenterology

## 2020-02-06 ENCOUNTER — Other Ambulatory Visit: Payer: Self-pay

## 2020-02-06 DIAGNOSIS — Z01812 Encounter for preprocedural laboratory examination: Secondary | ICD-10-CM | POA: Insufficient documentation

## 2020-02-06 DIAGNOSIS — Z20822 Contact with and (suspected) exposure to covid-19: Secondary | ICD-10-CM | POA: Insufficient documentation

## 2020-02-06 LAB — SARS CORONAVIRUS 2 (TAT 6-24 HRS): SARS Coronavirus 2: NEGATIVE

## 2020-02-09 ENCOUNTER — Encounter: Payer: Self-pay | Admitting: Gastroenterology

## 2020-02-09 ENCOUNTER — Ambulatory Visit (INDEPENDENT_AMBULATORY_CARE_PROVIDER_SITE_OTHER): Payer: Medicare Other | Admitting: Pharmacist

## 2020-02-09 DIAGNOSIS — I10 Essential (primary) hypertension: Secondary | ICD-10-CM

## 2020-02-09 DIAGNOSIS — E7800 Pure hypercholesterolemia, unspecified: Secondary | ICD-10-CM

## 2020-02-09 NOTE — Chronic Care Management (AMB) (Signed)
Chronic Care Management   Follow Up Note   02/09/2020 Name: Nicholas Black MRN: [id number] DOB: 01/14/1956  Referred by: Nicholas Hauser, DO Reason for referral : Chronic Care Management (Patient Phone Call)   Nicholas Black is a 64 y.o. year old male who is a primary care patient of Nicholas Hauser, DO. The CCM team was consulted for assistance with chronic disease management and care coordination needs.    I reached out to Nicholas Black by phone today.   Review of patient status, including review of consultants reports, relevant laboratory and other test results, and collaboration with appropriate care team members and the patient's provider was performed as part of comprehensive patient evaluation and provision of chronic care management services.     Outpatient Encounter Medications as of 02/09/2020  Medication Sig   amLODipine (NORVASC) 10 MG tablet Take 10 mg by mouth daily.   BAYER ASPIRIN EC LOW DOSE 81 MG EC tablet    finasteride (PROSCAR) 5 MG tablet Take 1 tablet (5 mg total) by mouth daily.   losartan (COZAAR) 50 MG tablet Take 1 tablet (50 mg total) by mouth daily.   pravastatin (PRAVACHOL) 40 MG tablet Take 1 tablet (40 mg total) by mouth daily.   Cholecalciferol (VITAMIN D HIGH POTENCY) 25 MCG (1000 UT) capsule Take 2,000 Units by mouth daily.   Omega-3 Fatty Acids (FISH OIL) 1000 MG CAPS Take by mouth daily.   polyethylene glycol-electrolytes (NULYTELY) 420 g solution Prepare according to package instructions. Starting at 5:00 PM: Drink one 8 oz glass of mixture every 15 minutes until you finish half of the jug. Five hours prior to procedure, drink 8 oz glass of mixture every 15 minutes until it is all gone. Make sure you do not drink anything 4 hours prior to your procedure.   Sod Picosulfate-Mag Ox-Cit Acd (CLENPIQ) 10-3.5-12 MG-GM -GM/160ML SOLN Take 1 bottle at 5 PM followed by five 8 oz cups of water and repeat 5 hours before procedure.    Sodium Sulfate-Mag Sulfate-KCl (SUTAB) [id number] MG TABS At 5 PM take 12 tablets using the 8 oz cup provided in the kit drinking 5 cups of water and 5 hours before your procedure repeat the same process.   No facility-administered encounter medications on file as of 02/09/2020.    Goals Addressed            This Visit's Progress    PharmD - Medication Adherence       CARE PLAN ENTRY (see longitudinal plan of care for additional care plan information)   Current Barriers:   Chronic Disease Management support, education, and care coordination needs related to HLD and BPH  Need for evaluation of medication adherence, as identified by patient's health plan, based on medication adherence quality data  Pharmacist Clinical Goal(s):   Over the next 30 days, patient will work with CM Pharmacist and PCP to address needs related to medication adherence  Interventions:  Perform chart review - Note patient scheduled for Colonoscopy tomorrow, 7/20.  o Patient reports he is following instructions for procedure as given by GI clinic  Discuss importance of medication adherence.  o Reports obtained weekly pillbox and started using yesterday.  Counsel on importance of blood pressure control and monitoring o Reports taking: - Amlodipine 10 mg once daily - Losartan 50 mg once daily o Denies monitoring home blood pressure o Encourage patient to start monitoring home blood pressure and keep log of results  Patient Self Care  Activities:   Calls provider office for new concerns or questions  Please see past updates related to this goal by clicking on the "Past Updates" button in the selected goal         Plan  Telephone follow up appointment with care management team member scheduled for: 8/6 at 12 pm  Nicholas Black, PharmD, [place] [phone]

## 2020-02-09 NOTE — Patient Instructions (Signed)
Thank you allowing the Chronic Care Management Team to be a part of your care! It was a pleasure speaking with you today!     CCM (Chronic Care Management) Team    Noreene Larsson RN, MSN, CCM Nurse Care Coordinator  [phone]   Harlow Asa PharmD  Clinical Pharmacist  [phone]   Eula Fried LCSW Clinical Social Worker [phone]  Visit Information  Goals Addressed            This Visit's Progress   . PharmD - Medication Adherence       CARE PLAN ENTRY (see longitudinal plan of care for additional care plan information)   Current Barriers:  . Chronic Disease Management support, education, and care coordination needs related to HLD and BPH . Need for evaluation of medication adherence, as identified by patient's health plan, based on medication adherence quality data  Pharmacist Clinical Goal(s):  Marland Kitchen Over the next 30 days, patient will work with CM Pharmacist and PCP to address needs related to medication adherence  Interventions: . Perform chart review - Note patient scheduled for Colonoscopy tomorrow, 7/20.  o Patient reports he is following instructions for procedure as given by GI clinic . Discuss importance of medication adherence.  o Reports obtained weekly pillbox and started using yesterday. Myles Rosenthal on importance of blood pressure control and monitoring o Reports taking: - Amlodipine 10 mg once daily - Losartan 50 mg once daily o Denies monitoring home blood pressure o Encourage patient to start monitoring home blood pressure and keep log of results  Patient Self Care Activities:  . Calls provider office for new concerns or questions  Please see past updates related to this goal by clicking on the "Past Updates" button in the selected goal         Patient verbalizes understanding of instructions provided today.   Telephone follow up appointment with care management team member scheduled for: 8/6 at 80 pm  Harlow Asa, PharmD,  [place] [phone]

## 2020-02-10 ENCOUNTER — Encounter
Admission: RE | Disposition: A | Discharge status: Home / Self Care | Payer: Self-pay | Source: Home / Self Care | Attending: Gastroenterology

## 2020-02-10 ENCOUNTER — Encounter: Payer: Self-pay | Admitting: Gastroenterology

## 2020-02-10 ENCOUNTER — Ambulatory Visit
Admission: RE | Admit: 2020-02-10 | Discharge: 2020-02-10 | Disposition: A | Discharge status: Home / Self Care | Payer: Medicare Other | Attending: Gastroenterology | Admitting: Gastroenterology

## 2020-02-10 ENCOUNTER — Ambulatory Visit: Payer: Medicare Other | Admitting: Certified Registered Nurse Anesthetist

## 2020-02-10 DIAGNOSIS — Z7982 Long term (current) use of aspirin: Secondary | ICD-10-CM | POA: Insufficient documentation

## 2020-02-10 DIAGNOSIS — K635 Polyp of colon: Secondary | ICD-10-CM | POA: Diagnosis not present

## 2020-02-10 DIAGNOSIS — E7800 Pure hypercholesterolemia, unspecified: Secondary | ICD-10-CM | POA: Diagnosis not present

## 2020-02-10 DIAGNOSIS — K5730 Diverticulosis of large intestine without perforation or abscess without bleeding: Secondary | ICD-10-CM | POA: Diagnosis not present

## 2020-02-10 DIAGNOSIS — I10 Essential (primary) hypertension: Secondary | ICD-10-CM | POA: Insufficient documentation

## 2020-02-10 DIAGNOSIS — D125 Benign neoplasm of sigmoid colon: Secondary | ICD-10-CM | POA: Insufficient documentation

## 2020-02-10 DIAGNOSIS — Z1211 Encounter for screening for malignant neoplasm of colon: Secondary | ICD-10-CM

## 2020-02-10 DIAGNOSIS — D123 Benign neoplasm of transverse colon: Secondary | ICD-10-CM | POA: Diagnosis not present

## 2020-02-10 DIAGNOSIS — Z79899 Other long term (current) drug therapy: Secondary | ICD-10-CM | POA: Insufficient documentation

## 2020-02-10 DIAGNOSIS — K5790 Diverticulosis of intestine, part unspecified, without perforation or abscess without bleeding: Secondary | ICD-10-CM | POA: Diagnosis not present

## 2020-02-10 DIAGNOSIS — Z87891 Personal history of nicotine dependence: Secondary | ICD-10-CM | POA: Insufficient documentation

## 2020-02-10 DIAGNOSIS — D126 Benign neoplasm of colon, unspecified: Secondary | ICD-10-CM | POA: Diagnosis not present

## 2020-02-10 HISTORY — PX: COLONOSCOPY WITH PROPOFOL: SHX5780

## 2020-02-10 HISTORY — DX: Pure hypercholesterolemia, unspecified: E78.00

## 2020-02-10 HISTORY — DX: Essential (primary) hypertension: I10

## 2020-02-10 SURGERY — COLONOSCOPY WITH PROPOFOL
Anesthesia: General

## 2020-02-10 MED ORDER — PROPOFOL 500 MG/50ML IV EMUL
INTRAVENOUS | Status: DC | PRN
  Administered 2020-02-10: 175 ug/kg/min via INTRAVENOUS

## 2020-02-10 MED ORDER — LIDOCAINE HCL (CARDIAC) PF 100 MG/5ML IV SOSY
PREFILLED_SYRINGE | INTRAVENOUS | Status: DC | PRN
  Administered 2020-02-10: 50 mg via INTRAVENOUS

## 2020-02-10 MED ORDER — PROPOFOL 10 MG/ML IV BOLUS
INTRAVENOUS | Status: DC | PRN
  Administered 2020-02-10: 70 mg via INTRAVENOUS

## 2020-02-10 MED ORDER — SODIUM CHLORIDE 0.9 % IV SOLN
INTRAVENOUS | Status: DC
  Administered 2020-02-10: 1000 mL via INTRAVENOUS

## 2020-02-10 NOTE — Anesthesia Procedure Notes (Signed)
Date/Time: 02/10/2020 10:23 AM Performed by: Johnna Acosta, CRNA Pre-anesthesia Checklist: Patient identified, Emergency Drugs available, Suction available, Patient being monitored and Timeout performed Patient Re-evaluated:Patient Re-evaluated prior to induction Oxygen Delivery Method: Nasal cannula Preoxygenation: Pre-oxygenation with 100% oxygen Induction Type: IV induction

## 2020-02-10 NOTE — Anesthesia Preprocedure Evaluation (Signed)
Anesthesia Evaluation  Patient identified by MRN, date of birth, ID band Patient awake    Reviewed: Allergy & Precautions, H&P , NPO status , Patient's Chart, lab work & pertinent test results  History of Anesthesia Complications Negative for: history of anesthetic complications  Airway Mallampati: III  TM Distance: >3 FB Neck ROM: full    Dental  (+) Teeth Intact   Pulmonary neg COPD, former smoker,    Pulmonary exam normal        Cardiovascular hypertension, (-) angina(-) Past MI and (-) Cardiac Stents Normal cardiovascular exam(-) dysrhythmias      Neuro/Psych negative neurological ROS  negative psych ROS   GI/Hepatic Neg liver ROS, GERD  Controlled,  Endo/Other  negative endocrine ROS  Renal/GU negative Renal ROS  negative genitourinary   Musculoskeletal   Abdominal   Peds  Hematology negative hematology ROS (+)   Anesthesia Other Findings Past Medical History: No date: Hypercholesteremia No date: Hypertension  Past Surgical History: No date: NO PAST SURGERIES  BMI    Body Mass Index: 29.80 kg/m      Reproductive/Obstetrics negative OB ROS                             Anesthesia Physical Anesthesia Plan  ASA: II  Anesthesia Plan: General   Post-op Pain Management:    Induction:   PONV Risk Score and Plan: Propofol infusion and TIVA  Airway Management Planned: Nasal Cannula  Additional Equipment:   Intra-op Plan:   Post-operative Plan:   Informed Consent: I have reviewed the patients History and Physical, chart, labs and discussed the procedure including the risks, benefits and alternatives for the proposed anesthesia with the patient or authorized representative who has indicated his/her understanding and acceptance.     Dental Advisory Given  Plan Discussed with: Anesthesiologist, CRNA and Surgeon  Anesthesia Plan Comments:         Anesthesia Quick  Evaluation

## 2020-02-10 NOTE — Anesthesia Postprocedure Evaluation (Signed)
Anesthesia Post Note  Patient: Nicholas Black  Procedure(s) Performed: COLONOSCOPY WITH PROPOFOL (N/A )  Patient location during evaluation: PACU Anesthesia Type: General Level of consciousness: awake and alert Pain management: pain level controlled Vital Signs Assessment: post-procedure vital signs reviewed and stable Respiratory status: spontaneous breathing, nonlabored ventilation and respiratory function stable Cardiovascular status: blood pressure returned to baseline and stable Postop Assessment: no apparent nausea or vomiting Anesthetic complications: no   No complications documented.   Last Vitals:  Vitals:   02/10/20 1104 02/10/20 1114  BP: 95/60 104/70  Pulse: 62 (!) 54  Resp: (!) 21 16  Temp:    SpO2: 99% 100%    Last Pain:  Vitals:   02/10/20 1114  TempSrc:   PainSc: 0-No pain                 Brett Canales Kynsli Haapala

## 2020-02-10 NOTE — H&P (Signed)
Vonda Antigua, MD [address], [place], [place], [place], [address] [address], [place], [place], [place], [address] Phone: [phone]  Fax: [phone]  Primary Care Physician:  Olin Hauser, DO   Pre-Procedure History & Physical: HPI:  Nicholas Black is a 64 y.o. male is here for a colonoscopy.   History reviewed. No pertinent past medical history.  History reviewed. No pertinent surgical history.  Prior to Admission medications   Medication Sig Start Date End Date Taking? Authorizing Provider  amLODipine (NORVASC) 10 MG tablet Take 10 mg by mouth daily. 01/17/20  Yes [provider]  BAYER ASPIRIN EC LOW DOSE 81 MG EC tablet  06/17/19  Yes [provider]  finasteride (PROSCAR) 5 MG tablet Take 1 tablet (5 mg total) by mouth daily. 11/07/19  Yes Malfi, Lupita Raider, FNP  losartan (COZAAR) 50 MG tablet Take 1 tablet (50 mg total) by mouth daily. 11/07/19  Yes Malfi, Lupita Raider, FNP  pravastatin (PRAVACHOL) 40 MG tablet Take 1 tablet (40 mg total) by mouth daily. 11/07/19  Yes Malfi, Lupita Raider, FNP  Cholecalciferol (VITAMIN D HIGH POTENCY) 25 MCG (1000 UT) capsule Take 2,000 Units by mouth daily.    [provider]  Omega-3 Fatty Acids (FISH OIL) 1000 MG CAPS Take by mouth daily.    [provider]  polyethylene glycol-electrolytes (NULYTELY) 420 g solution Prepare according to package instructions. Starting at 5:00 PM: Drink one 8 oz glass of mixture every 15 minutes until you finish half of the jug. Five hours prior to procedure, drink 8 oz glass of mixture every 15 minutes until it is all gone. Make sure you do not drink anything 4 hours prior to your procedure. 02/02/20   Virgel Manifold, MD  Sod Picosulfate-Mag Ox-Cit Acd (CLENPIQ) 10-3.5-12 MG-GM -GM/160ML SOLN Take 1 bottle at 5 PM followed by five 8 oz cups of water and repeat 5 hours before procedure. 02/02/20   Virgel Manifold, MD  Sodium Sulfate-Mag Sulfate-KCl (SUTAB)  [id number] MG TABS At 5 PM take 12 tablets using the 8 oz cup provided in the kit drinking 5 cups of water and 5 hours before your procedure repeat the same process. 02/02/20   Virgel Manifold, MD    Allergies as of 02/02/2020  . (No Known Allergies)    Family History  Problem Relation Age of Onset  . Heart disease Mother     Social History   Socioeconomic History  . Marital status: Married    Spouse name: Not on file  . Number of children: 1  . Years of education: [employer]  . Highest education level: High school graduate  Occupational History  . Occupation: Self Employed Therapist, nutritional  Tobacco Use  . Smoking status: Former Smoker    Packs/day: 1.00    Years: 20.00    Pack years: 20.00    Types: Cigarettes    Quit date: 2016    Years since quitting: 5.5  . Smokeless tobacco: Former Network engineer  . Vaping Use: Never used  Substance and Sexual Activity  . Alcohol use: Yes    Alcohol/week: 3.0 standard drinks    Types: 3 Cans of beer per week  . Drug use: Never  . Sexual activity: Not on file  Other Topics Concern  . Not on file  Social History Narrative  . Not on file   Social Determinants of Health   Financial Resource Strain:   . Difficulty of Paying  Living Expenses:   Food Insecurity:   . Worried About Charity fundraiser in the Last Year:   . Arboriculturist in the Last Year:   Transportation Needs:   . Film/video editor (Medical):   Marland Kitchen Lack of Transportation (Non-Medical):   Physical Activity:   . Days of Exercise per Week:   . Minutes of Exercise per Session:   Stress:   . Feeling of Stress :   Social Connections:   . Frequency of Communication with Friends and Family:   . Frequency of Social Gatherings with Friends and Family:   . Attends Religious Services:   . Active Member of Clubs or Organizations:   . Attends Archivist Meetings:   Marland Kitchen Marital Status:   Intimate Partner Violence:   . Fear of Current or Ex-Partner:    . Emotionally Abused:   Marland Kitchen Physically Abused:   . Sexually Abused:     Review of Systems: See HPI, otherwise negative ROS  Physical Exam: There were no vitals taken for this visit. General:   Alert,  pleasant and cooperative in NAD Head:  Normocephalic and atraumatic. Neck:  Supple; no masses or thyromegaly. Lungs:  Clear throughout to auscultation, normal respiratory effort.    Heart:  +S1, +S2, Regular rate and rhythm, No edema. Abdomen:  Soft, nontender and nondistended. Normal bowel sounds, without guarding, and without rebound.   Neurologic:  Alert and  oriented x4;  grossly normal neurologically.  Impression/Plan: Nicholas Black is here for a colonoscopy to be performed for average risk screening.  Risks, benefits, limitations, and alternatives regarding  colonoscopy have been reviewed with the patient.  Questions have been answered.  All parties agreeable.   Virgel Manifold, MD  02/10/2020, 10:01 AM

## 2020-02-10 NOTE — Op Note (Signed)
[facility] Patient Name: Nicholas Black Procedure Date: 02/09/2020 3:42 PM MRN: [id number] Account #: [id number] Date of Birth: 1956/04/28 Admit Type: Outpatient Age: 64 Room: [facility] ENDO ROOM 2 Gender: Male Note Status: Finalized Procedure:             Colonoscopy Indications:           Screening for colorectal malignant neoplasm Providers:             Mitcheal Sweetin B. Bonna Gains MD, MD Medicines:             Monitored Anesthesia Care Complications:         No immediate complications. Procedure:             Pre-Anesthesia Assessment:                        - ASA Grade Assessment: II - A patient with mild                         systemic disease.                        - Prior to the procedure, a History and Physical was                         performed, and patient medications, allergies and                         sensitivities were reviewed. The patient's tolerance                         of previous anesthesia was reviewed.                        - The risks and benefits of the procedure and the                         sedation options and risks were discussed with the                         patient. All questions were answered and informed                         consent was obtained.                        - Patient identification and proposed procedure were                         verified prior to the procedure by the physician, the                         nurse, the anesthesiologist, the anesthetist and the                         technician. The procedure was verified in the                         procedure room.  After obtaining informed consent, the colonoscope was                         passed under direct vision. Throughout the procedure,                         the patient's blood pressure, pulse, and oxygen                         saturations were monitored continuously. The                         Colonoscope was  introduced through the anus and                         advanced to the the cecum, identified by appendiceal                         orifice and ileocecal valve. The colonoscopy was                         performed with ease. The patient tolerated the                         procedure well. The quality of the bowel preparation                         was good. Findings:      The perianal and digital rectal examinations were normal.      A 7 mm polyp was found in the transverse colon. The polyp was sessile.       The polyp was removed with a cold snare. Resection and retrieval were       complete.      A 4 mm polyp was found in the sigmoid colon. The polyp was flat. The       polyp was removed with a cold biopsy forceps. Resection and retrieval       were complete.      Multiple diverticula were found in the sigmoid colon.      The exam was otherwise without abnormality.      The rectum, sigmoid colon, descending colon, transverse colon, ascending       colon and cecum appeared normal.      The retroflexed view of the distal rectum and anal verge was normal and       showed no anal or rectal abnormalities. Impression:            - One 7 mm polyp in the transverse colon, removed with                         a cold snare. Resected and retrieved.                        - One 4 mm polyp in the sigmoid colon, removed with a                         cold biopsy forceps. Resected and retrieved.                        -  Diverticulosis in the sigmoid colon.                        - The examination was otherwise normal.                        - The rectum, sigmoid colon, descending colon,                         transverse colon, ascending colon and cecum are normal.                        - The distal rectum and anal verge are normal on                         retroflexion view. Recommendation:        - Discharge patient to home (with escort).                        - Advance diet as  tolerated.                        - Continue present medications.                        - Await pathology results.                        - Repeat colonoscopy in 5 years.                        - The findings and recommendations were discussed with                         the patient.                        - The findings and recommendations were discussed with                         the patient's family.                        - Return to primary care physician as previously                         scheduled.                        - High fiber diet. Procedure Code(s):     --- Professional ---                        [phone], Colonoscopy, flexible; with removal of                         tumor(s), polyp(s), or other lesion(s) by snare                         technique                        94765, 59, Colonoscopy,  flexible; with biopsy, single                         or multiple Diagnosis Code(s):     --- Professional ---                        Z12.11, Encounter for screening for malignant neoplasm                         of colon                        K63.5, Polyp of colon CPT copyright 2019 American Medical Association. All rights reserved. The codes documented in this report are preliminary and upon coder review may  be revised to meet current compliance requirements.  Vonda Antigua, MD Margretta Sidle B. Bonna Gains MD, MD 02/10/2020 10:54:17 AM This report has been signed electronically. Number of Addenda: 0 Note Initiated On: 02/09/2020 3:42 PM Scope Withdrawal Time: 0 hours 18 minutes 29 seconds  Total Procedure Duration: 0 hours 21 minutes 41 seconds  Estimated Blood Loss:  Estimated blood loss: none.      [facility]

## 2020-02-10 NOTE — Transfer of Care (Signed)
Immediate Anesthesia Transfer of Care Note  Patient: Nicholas Black  Procedure(s) Performed: COLONOSCOPY WITH PROPOFOL (N/A )  Patient Location: PACU  Anesthesia Type:General  Level of Consciousness: sedated  Airway & Oxygen Therapy: Patient Spontanous Breathing  Post-op Assessment: Report given to RN and Post -op Vital signs reviewed and stable  Post vital signs: Reviewed and stable  Last Vitals:  Vitals Value Taken Time  BP 96/67 02/10/20 1055  Temp    Pulse 82 02/10/20 1055  Resp 22 02/10/20 1055  SpO2 98 % 02/10/20 1055    Last Pain:  Vitals:   02/10/20 1005  TempSrc: Tympanic  PainSc: 0-No pain         Complications: No complications documented.

## 2020-02-11 ENCOUNTER — Encounter: Payer: Self-pay | Admitting: Gastroenterology

## 2020-02-11 LAB — SURGICAL PATHOLOGY

## 2020-02-12 ENCOUNTER — Encounter: Payer: Self-pay | Admitting: Gastroenterology

## 2020-02-27 ENCOUNTER — Telehealth: Payer: Self-pay | Admitting: Pharmacist

## 2020-02-27 ENCOUNTER — Telehealth: Payer: Self-pay

## 2020-02-27 NOTE — Telephone Encounter (Signed)
°  Chronic Care Management   Outreach Note  02/27/2020 Name: Nicholas Black MRN: [id number] DOB: Apr 05, 1956  Referred by: Olin Hauser, DO Reason for referral : No chief complaint on file.  Was unable to reach patient via telephone today on home or mobile numbers and unable to leave a message as no voicemail picks up.  Follow Up Plan: Will collaborate with Care Guide to outreach to schedule follow up with me  Harlow Asa, PharmD, [place] [facility] [phone]

## 2020-03-08 NOTE — Telephone Encounter (Signed)
Pt has been r/s for 03/17/2020

## 2020-03-17 ENCOUNTER — Ambulatory Visit: Payer: Medicare Other | Admitting: Pharmacist

## 2020-03-17 DIAGNOSIS — I10 Essential (primary) hypertension: Secondary | ICD-10-CM

## 2020-03-17 NOTE — Patient Instructions (Signed)
Thank you allowing the Chronic Care Management Team to be a part of your care! It was a pleasure speaking with you today!     CCM (Chronic Care Management) Team    Noreene Larsson RN, MSN, CCM Nurse Care Coordinator  [phone]   Harlow Asa PharmD  Clinical Pharmacist  [phone]   Eula Fried LCSW Clinical Social Worker [phone]  Visit Information  Goals Addressed            This Visit's Progress   . PharmD - Medication Adherence       CARE PLAN ENTRY (see longitudinal plan of care for additional care plan information)   Current Barriers:  . Chronic Disease Management support, education, and care coordination needs related to HLD and BPH . Need for evaluation of medication adherence, as identified by patient's health plan, based on medication adherence quality data  Pharmacist Clinical Goal(s):  Marland Kitchen Over the next 30 days, patient will work with CM Pharmacist and PCP to address needs related to medication adherence  Interventions: . Perform chart review. Note colonoscopy completed as scheduled on 7/20 . Follow up with patient regarding blood pressure control and monitoring as scheduled o Reports recently checked BP and SBP was 112. Reports he "felt a little funny". Patient unable to review BP readings or talk further as currently driving.  o Encourage patient to continue BP monitoring and keeping log of results.  o Schedule appointment to review BP results.   Patient Self Care Activities:  . Calls provider office for new concerns or questions  Please see past updates related to this goal by clicking on the "Past Updates" button in the selected goal         Patient verbalizes understanding of instructions provided today.   Telephone follow up appointment with care management team member scheduled for: 8/27 at 8:30 am  Harlow Asa, PharmD, [place] [phone]

## 2020-03-17 NOTE — Chronic Care Management (AMB) (Signed)
Chronic Care Management   Follow Up Note   03/17/2020 Name: Nicholas Black MRN: [id number] DOB: 1955/09/11  Referred by: Olin Hauser, DO Reason for referral : Chronic Care Management (Patient Phone Call)   Nicholas Black is a 63 y.o. year old male who is a primary care patient of Olin Hauser, DO. The CCM team was consulted for assistance with chronic disease management and care coordination needs.    I reached out to Nicholas Black by phone today.   Review of patient status, including review of consultants reports, relevant laboratory and other test results, and collaboration with appropriate care team members and the patient's provider was performed as part of comprehensive patient evaluation and provision of chronic care management services.    SDOH (Social Determinants of Health) assessments performed: No See Care Plan activities for detailed interventions related to [facility])     Outpatient Encounter Medications as of 03/17/2020  Medication Sig  . amLODipine (NORVASC) 10 MG tablet Take 10 mg by mouth daily.  Marland Kitchen BAYER ASPIRIN EC LOW DOSE 81 MG EC tablet   . Cholecalciferol (VITAMIN D HIGH POTENCY) 25 MCG (1000 UT) capsule Take 2,000 Units by mouth daily.  . finasteride (PROSCAR) 5 MG tablet Take 1 tablet (5 mg total) by mouth daily.  Marland Kitchen losartan (COZAAR) 50 MG tablet Take 1 tablet (50 mg total) by mouth daily.  . Omega-3 Fatty Acids (FISH OIL) 1000 MG CAPS Take by mouth daily.  . polyethylene glycol-electrolytes (NULYTELY) 420 g solution Prepare according to package instructions. Starting at 5:00 PM: Drink one 8 oz glass of mixture every 15 minutes until you finish half of the jug. Five hours prior to procedure, drink 8 oz glass of mixture every 15 minutes until it is all gone. Make sure you do not drink anything 4 hours prior to your procedure.  . pravastatin (PRAVACHOL) 40 MG tablet Take 1 tablet (40 mg total) by mouth daily.  . Sod Picosulfate-Mag Ox-Cit Acd  (CLENPIQ) 10-3.5-12 MG-GM -GM/160ML SOLN Take 1 bottle at 5 PM followed by five 8 oz cups of water and repeat 5 hours before procedure.  . Sodium Sulfate-Mag Sulfate-KCl (SUTAB) [phone] MG TABS At 5 PM take 12 tablets using the 8 oz cup provided in the kit drinking 5 cups of water and 5 hours before your procedure repeat the same process.   No facility-administered encounter medications on file as of 03/17/2020.    Goals Addressed            This Visit's Progress   . PharmD - Medication Adherence       CARE PLAN ENTRY (see longitudinal plan of care for additional care plan information)   Current Barriers:  . Chronic Disease Management support, education, and care coordination needs related to HLD and BPH . Need for evaluation of medication adherence, as identified by patient's health plan, based on medication adherence quality data  Pharmacist Clinical Goal(s):  Marland Kitchen Over the next 30 days, patient will work with CM Pharmacist and PCP to address needs related to medication adherence  Interventions: . Perform chart review. Note colonoscopy completed as scheduled on 7/20 . Follow up with patient regarding blood pressure control and monitoring as scheduled o Reports recently checked BP and SBP was 112. Reports he "felt a little funny". Patient unable to review BP readings or talk further as currently driving.  o Encourage patient to continue BP monitoring and keeping log of results.  o Schedule appointment to review BP  results.   Patient Self Care Activities:  . Calls provider office for new concerns or questions  Please see past updates related to this goal by clicking on the "Past Updates" button in the selected goal         Plan  Telephone follow up appointment with care management team member scheduled for: 8/27 at 8:30 am  Harlow Asa, PharmD, [place] [phone]

## 2020-03-19 ENCOUNTER — Telehealth: Payer: Self-pay

## 2020-03-19 ENCOUNTER — Telehealth: Payer: Self-pay | Admitting: Pharmacist

## 2020-03-19 NOTE — Telephone Encounter (Signed)
  Chronic Care Management   Outreach Note  03/19/2020 Name: Nicholas Black MRN: [id number] DOB: 30-Jul-1955  Referred by: Olin Hauser, DO Reason for referral : No chief complaint on file.  Was unable to reach patient via either home or mobile telephone today and unable to leave a message as mobile phone voicemail is full and no voicemail picks up on home phone  Follow Up Plan: Will collaborate with Care Guide to outreach to schedule follow up with me  Harlow Asa, PharmD, [place] [facility] [phone]

## 2020-03-22 ENCOUNTER — Telehealth: Payer: Self-pay

## 2020-03-22 NOTE — Chronic Care Management (AMB) (Signed)
  Care Management   Note  03/22/2020 Name: Nicholas Black MRN: [id number] DOB: [birth year]  Nicholas Black is a 64 y.o. year old male who is a primary care patient of Olin Hauser, DO and is actively engaged with the care management team. I reached out to [name] by phone today to assist with re-scheduling a follow up visit with the Pharmacist  Follow up plan: Unsuccessful telephone outreach attempt made. A HIPPA compliant phone message was left for the patient providing contact information and requesting a return call.  The care management team will reach out to the patient again over the next 7 days.  If patient returns call to provider office, please advise to call [facility]  at [place], [place], [place], [place] [address] Direct Dial: [phone] [email or web address]: [email or web address]

## 2020-03-31 NOTE — Chronic Care Management (AMB) (Signed)
  Care Management   Note  03/31/2020 Name: Nicholas Black MRN: [id number] DOB: March 21, 1956  Nicholas Black is a 64 y.o. year old male who is a primary care patient of Olin Hauser, DO and is actively engaged with the care management team. I reached out to [name] by phone today to assist with re-scheduling a follow up visit with the Pharmacist  Follow up plan: Second Unsuccessful telephone outreach attempt made. A HIPPA compliant phone message was left for the patient providing contact information and requesting a return call.  The care management team will reach out to the patient again over the next 7 days.  If patient returns call to provider office, please advise to call [facility]  at [place], [place], [place], [place] [address] Direct Dial: [phone] [email or web address]: [email or web address]

## 2020-04-06 NOTE — Telephone Encounter (Signed)
Pt has been r/s for 04/26/2020

## 2020-04-06 NOTE — Chronic Care Management (AMB) (Signed)
°  Care Management   Note  04/06/2020 Name: BRALEN WILTGEN MRN: [id number] DOB: [birth year]  DRESEAN BECKEL is a 64 y.o. year old male who is a primary care patient of Olin Hauser, DO and is actively engaged with the care management team. I reached out to [name] by phone today to assist with re-scheduling a follow up visit with the Pharmacist  Follow up plan: Telephone appointment with care management team member scheduled for:04/26/2020  Noreene Larsson, [place], [place], [place] [address] Direct Dial: [phone] [email or web address]: [email or web address]

## 2020-04-16 ENCOUNTER — Telehealth: Payer: Medicare Other

## 2020-04-26 ENCOUNTER — Telehealth: Payer: Self-pay | Admitting: Pharmacist

## 2020-04-26 ENCOUNTER — Telehealth: Payer: Medicare Other

## 2020-04-26 NOTE — Progress Notes (Signed)
  Chronic Care Management   Outreach Note  04/26/2020 Name: Nicholas Black MRN: [id number] DOB: Feb 18, 1956  Referred by: Olin Hauser, DO Reason for referral : No chief complaint on file.   Third unsuccessful telephone outreach was attempted today. The patient was referred to the case management team for assistance with care management and care coordination. The patient's primary care provider has been notified of our unsuccessful attempts to make or maintain contact with the patient. The care management team is pleased to engage with this patient at any time in the future should he/she be interested in assistance from the care management team.    Harlow Asa, PharmD, [place] [facility] [phone]

## 2020-07-12 ENCOUNTER — Ambulatory Visit: Payer: Medicare Other | Admitting: Family Medicine

## 2020-07-13 ENCOUNTER — Ambulatory Visit: Payer: Medicare Other | Admitting: Family Medicine

## 2020-07-14 ENCOUNTER — Other Ambulatory Visit: Payer: Self-pay

## 2020-07-14 ENCOUNTER — Encounter: Payer: Self-pay | Admitting: Family Medicine

## 2020-07-14 ENCOUNTER — Ambulatory Visit (INDEPENDENT_AMBULATORY_CARE_PROVIDER_SITE_OTHER): Payer: Medicare Other | Admitting: Family Medicine

## 2020-07-14 VITALS — BP 138/73 | HR 71 | Temp 97.3°F | Resp 16 | Ht 68.0 in | Wt 209.0 lb

## 2020-07-14 DIAGNOSIS — N401 Enlarged prostate with lower urinary tract symptoms: Secondary | ICD-10-CM

## 2020-07-14 DIAGNOSIS — I10 Essential (primary) hypertension: Secondary | ICD-10-CM | POA: Diagnosis not present

## 2020-07-14 DIAGNOSIS — N138 Other obstructive and reflux uropathy: Secondary | ICD-10-CM | POA: Diagnosis not present

## 2020-07-14 DIAGNOSIS — E7800 Pure hypercholesterolemia, unspecified: Secondary | ICD-10-CM

## 2020-07-14 DIAGNOSIS — R7303 Prediabetes: Secondary | ICD-10-CM | POA: Diagnosis not present

## 2020-07-14 LAB — POCT GLYCOSYLATED HEMOGLOBIN (HGB A1C): Hemoglobin A1C: 6.1 % — AB (ref 4.0–5.6)

## 2020-07-14 MED ORDER — LOSARTAN POTASSIUM 50 MG PO TABS: 50.0000 mg | ORAL_TABLET | Freq: Every day | ORAL | 3 refills | Status: DC

## 2020-07-14 MED ORDER — AMLODIPINE BESYLATE 10 MG PO TABS: 10.0000 mg | ORAL_TABLET | Freq: Every day | ORAL | 3 refills | Status: DC

## 2020-07-14 MED ORDER — FINASTERIDE 5 MG PO TABS: 5.0000 mg | ORAL_TABLET | Freq: Every day | ORAL | 3 refills | Status: DC

## 2020-07-14 MED ORDER — PRAVASTATIN SODIUM 40 MG PO TABS: 40.0000 mg | ORAL_TABLET | Freq: Every day | ORAL | 3 refills | Status: DC

## 2020-07-14 NOTE — Patient Instructions (Addendum)
Thank you for coming to the office today.  Copy of COVID card  All 4 [facility]s refilled [facility]  DUE for FASTING BLOOD WORK (no food or drink after midnight before the lab appointment, only water or coffee without cream/sugar on the morning of)  SCHEDULE "Lab Only" visit in the morning at the clinic for lab draw in 6 MONTHS   - Make sure Lab Only appointment is at about 1 week before your next appointment, so that results will be available  For Lab Results, once available within 2-3 days of blood draw, you can can log in to MyChart online to view your results and a brief explanation. Also, we can discuss results at next follow-up visit.   Please schedule a Follow-up Appointment to: Return in about 6 months (around 01/12/2021) for 6 month fasting lab only then 1 week later Annual Physical.  If you have any other questions or concerns, please feel free to call the office or send a message through [place]. You may also schedule an earlier appointment if necessary.  Additionally, you may be receiving a survey about your experience at our office within a few days to 1 week by e-mail or mail. We value your feedback.  Nobie Putnam, DO [place]

## 2020-07-14 NOTE — Progress Notes (Signed)
Subjective:    Patient ID: Nicholas Black, male    DOB: March 26, 1956, 64 y.o.   MRN: [id number]  Nicholas Black is a 64 y.o. male presenting on 07/14/2020 for Hypertension   HPI   Pre-Diabetes/ Hyperglycemia Previous A1c 6.3 to 6.6 in past 2 years Currently due for A1c Meds:None, never on Currentlynoton ACEi / ARB Lifestyle: - Diet (improving diet overall, less greasy foods, now drinks occasional tea but not much at all) - Exercise:Increasing cardio and exercise, bicycle, gym, increasing to 40-60 miles per week Denies hypoglycemia  CHRONIC HTN: Reportshe has had been treated for HTN for past >4-5 years. He checks BP regularly at home, has had some fluctuation on readings but overall controlled. Needs refill Current Meds -Amlodipine10mg  daily, HCTZ 25mg  daily Reports good compliance,except recently did not fill meds - did not take for few days now Tolerating well, w/o complaints. Denies HA, chest pain, syncope, lightheaded, dizzy  BPH,LUTS Previously on Tamsulosin Now doing well on Finasteride 5mg  Last lab PSA down to 1.3, on finasteride previously 12/2019 Not seen Urologist Still has urinary frequency, urgency and nocturia - some improvement overall, needs refill Finasteride  Admits some occasional anxiety type symptoms - related to not wanting to drive at times.  Health Maintenance:  Colon CA Screening: Never had colonoscopy or screening. Currently asymptomatic. No known family history of colon CA. Completed Colonoscopy 02/10/20 - next due 2026 (5 years)  Prostate CA Screening: See above. PSA trend results below.  UTD COVID vaccine, will bring back card for booster, already done.   UTD Flu vaccine  Depression screen [facility] 2/9 07/14/2020 01/15/2020 11/07/2019  Decreased Interest 0 0 0  Down, Depressed, Hopeless 0 0 0  PHQ - 2 Score 0 0 0   GAD 7 : Generalized Anxiety Score 07/14/2020  Nervous, Anxious, on Edge 1  Control/stop worrying 0  Worry too much  - different things 0  Trouble relaxing 0  Restless 1  Easily annoyed or irritable 1  Afraid - awful might happen 0  Total GAD 7 Score 3  Anxiety Difficulty Not difficult at all      Social History   Tobacco Use  . Smoking status: Former Smoker    Packs/day: 1.00    Years: 20.00    Pack years: 20.00    Types: Cigarettes    Quit date: 2016    Years since quitting: 5.9  . Smokeless tobacco: Former Network engineer  . Vaping Use: Never used  Substance Use Topics  . Alcohol use: Yes    Alcohol/week: 3.0 standard drinks    Types: 3 Cans of beer per week  . Drug use: Never    Review of Systems Per HPI unless specifically indicated above     Objective:    BP 138/73   Pulse 71   Temp (!) 97.3 F (36.3 C) (Temporal)   Resp 16   Ht 5\' 8"  (1.727 m)   Wt 209 lb (94.8 kg)   SpO2 98%   BMI 31.78 kg/m   Wt Readings from Last 3 Encounters:  07/14/20 209 lb (94.8 kg)  02/10/20 196 lb (88.9 kg)  01/15/20 207 lb (93.9 kg)    Physical Exam Vitals and nursing note reviewed.  Constitutional:      General: He is not in acute distress.    Appearance: He is well-developed and well-nourished. He is not diaphoretic.     Comments: Well-appearing, comfortable, cooperative  HENT:     Head: Normocephalic  and atraumatic.     Mouth/Throat:     Mouth: Oropharynx is clear and moist.  Eyes:     General:        Right eye: No discharge.        Left eye: No discharge.     Conjunctiva/sclera: Conjunctivae normal.  Neck:     Thyroid: No thyromegaly.  Cardiovascular:     Rate and Rhythm: Normal rate and regular rhythm.     Pulses: Intact distal pulses.     Heart sounds: Normal heart sounds. No murmur heard.   Pulmonary:     Effort: Pulmonary effort is normal. No respiratory distress.     Breath sounds: Normal breath sounds. No wheezing or rales.  Musculoskeletal:        General: No edema. Normal range of motion.     Cervical back: Normal range of motion and neck supple.   Lymphadenopathy:     Cervical: No cervical adenopathy.  Skin:    General: Skin is warm and dry.     Findings: No erythema or rash.  Neurological:     Mental Status: He is alert and oriented to person, place, and time.  Psychiatric:        Mood and Affect: Mood and affect normal.        Behavior: Behavior normal.     Comments: Well groomed, good eye contact, normal speech and thoughts       Results for orders placed or performed in visit on 07/14/20  POCT HgB A1C  Result Value Ref Range   Hemoglobin A1C 6.1 (A) 4.0 - 5.6 %   Recent Labs    01/09/20 1005 07/14/20 1554  HGBA1C 6.3* 6.1*       Assessment & Plan:   Problem List Items Addressed This Visit    Pre-diabetes - Primary    Improved A1c to 6.1 Concern with obesity, HTN  Plan:  1. Not on any therapy currently  2. Encourage improved lifestyle - low carb, low sugar diet, reduce portion size, continue improving regular exercise 3. Follow-up 6 months A1c       Relevant Orders   POCT HgB A1C (Completed)   Essential hypertension    Well controlled HTN Limited outside readings. No known complications    Plan:  1. Continue current BP regimen - Amlodipine 10mg  daily, HCTZ 25mg  daily 2. Encourage improved lifestyle - low sodium diet, regular exercise 3. Continue monitor BP outside office, bring readings to next visit, if persistently >140/90 or new symptoms notify office sooner  Future consider taper down thiazide vs CCB if well controlled, improved lifestyle and BP improve      Relevant Medications   amLODipine (NORVASC) 10 MG tablet   losartan (COZAAR) 50 MG tablet   pravastatin (PRAVACHOL) 40 MG tablet   Elevated cholesterol    Controlled cholesterol on statin and lifestyle Refill Statin  Encourage improved lifestyle - low carb/cholesterol, reduce portion size, continue improving regular exercise      Relevant Medications   amLODipine (NORVASC) 10 MG tablet   losartan (COZAAR) 50 MG tablet    pravastatin (PRAVACHOL) 40 MG tablet   BPH with obstruction/lower urinary tract symptoms    Stable chronic BPH LUTS - AUA BPH score has been stable, mild-moderate, reviewed prior score OFF Tamsulosin - Last PSA 1.3 (12/2019) down from last 2.8 (04/2018) due to Finasteride - Last DRE reported by prior PCP enlarged - No known personal/family history of prostate CA  Plan: 1. Continue current dose  Finasteride 5mg  daily 2. Reviewed PSA stable (adjusted for finasteride) 3. Follow-up symptoms in future      Relevant Medications   finasteride (PROSCAR) 5 MG tablet      Meds ordered this encounter  Medications  . amLODipine (NORVASC) 10 MG tablet    Sig: Take 1 tablet (10 mg total) by mouth daily.    Dispense:  90 tablet    Refill:  3  . losartan (COZAAR) 50 MG tablet    Sig: Take 1 tablet (50 mg total) by mouth daily.    Dispense:  90 tablet    Refill:  3  . finasteride (PROSCAR) 5 MG tablet    Sig: Take 1 tablet (5 mg total) by mouth daily.    Dispense:  90 tablet    Refill:  3  . pravastatin (PRAVACHOL) 40 MG tablet    Sig: Take 1 tablet (40 mg total) by mouth daily.    Dispense:  90 tablet    Refill:  3      Follow up plan: Return in about 6 months (around 01/12/2021) for 6 month fasting lab only then 1 week later Annual Physical.  Future labs ordered for 12/2020  Nobie Putnam, [facility] 07/14/2020, 3:52 PM

## 2020-07-15 ENCOUNTER — Other Ambulatory Visit: Payer: Self-pay | Admitting: Family Medicine

## 2020-07-15 DIAGNOSIS — E7800 Pure hypercholesterolemia, unspecified: Secondary | ICD-10-CM

## 2020-07-15 DIAGNOSIS — N138 Other obstructive and reflux uropathy: Secondary | ICD-10-CM

## 2020-07-15 DIAGNOSIS — I10 Essential (primary) hypertension: Secondary | ICD-10-CM

## 2020-07-15 DIAGNOSIS — R7303 Prediabetes: Secondary | ICD-10-CM

## 2020-07-15 DIAGNOSIS — Z0000 Encounter for general adult medical examination without abnormal findings: Secondary | ICD-10-CM

## 2020-07-15 DIAGNOSIS — E1169 Type 2 diabetes mellitus with other specified complication: Secondary | ICD-10-CM | POA: Insufficient documentation

## 2020-07-15 NOTE — Assessment & Plan Note (Signed)
Improved A1c to 6.1 Concern with obesity, HTN  Plan:  1. Not on any therapy currently  2. Encourage improved lifestyle - low carb, low sugar diet, reduce portion size, continue improving regular exercise 3. Follow-up 6 months A1c

## 2020-07-15 NOTE — Assessment & Plan Note (Signed)
Controlled cholesterol on statin and lifestyle Refill Statin  Encourage improved lifestyle - low carb/cholesterol, reduce portion size, continue improving regular exercise

## 2020-07-15 NOTE — Assessment & Plan Note (Signed)
Well controlled HTN Limited outside readings. No known complications    Plan:  1. Continue current BP regimen - Amlodipine 10mg daily, HCTZ 25mg daily 2. Encourage improved lifestyle - low sodium diet, regular exercise 3. Continue monitor BP outside office, bring readings to next visit, if persistently >140/90 or new symptoms notify office sooner  Future consider taper down thiazide vs CCB if well controlled, improved lifestyle and BP improve 

## 2020-07-15 NOTE — Assessment & Plan Note (Signed)
Stable chronic BPH LUTS - AUA BPH score has been stable, mild-moderate, reviewed prior score OFF Tamsulosin - Last PSA 1.3 (12/2019) down from last 2.8 (04/2018) due to Finasteride - Last DRE reported by prior PCP enlarged - No known personal/family history of prostate CA  Plan: 1. Continue current dose Finasteride 5mg  daily 2. Reviewed PSA stable (adjusted for finasteride) 3. Follow-up symptoms in future

## 2021-01-04 ENCOUNTER — Other Ambulatory Visit: Payer: Medicare Other

## 2021-01-05 DIAGNOSIS — E7800 Pure hypercholesterolemia, unspecified: Secondary | ICD-10-CM | POA: Diagnosis not present

## 2021-01-05 DIAGNOSIS — N401 Enlarged prostate with lower urinary tract symptoms: Secondary | ICD-10-CM | POA: Diagnosis not present

## 2021-01-05 DIAGNOSIS — I10 Essential (primary) hypertension: Secondary | ICD-10-CM | POA: Diagnosis not present

## 2021-01-05 DIAGNOSIS — Z0000 Encounter for general adult medical examination without abnormal findings: Secondary | ICD-10-CM | POA: Diagnosis not present

## 2021-01-05 DIAGNOSIS — R7303 Prediabetes: Secondary | ICD-10-CM | POA: Diagnosis not present

## 2021-01-05 DIAGNOSIS — N138 Other obstructive and reflux uropathy: Secondary | ICD-10-CM | POA: Diagnosis not present

## 2021-01-07 LAB — CBC WITH DIFFERENTIAL/PLATELET
Absolute Monocytes: 590 cells/uL (ref 200–950)
Basophils Absolute: 18 cells/uL (ref 0–200)
Basophils Relative: 0.4 %
Eosinophils Absolute: 90 cells/uL (ref 15–500)
Eosinophils Relative: 2 %
HCT: 42 % (ref 38.5–50.0)
Hemoglobin: 13.7 g/dL (ref 13.2–17.1)
Lymphs Abs: 1076 cells/uL (ref 850–3900)
MCH: 30.2 pg (ref 27.0–33.0)
MCHC: 32.6 g/dL (ref 32.0–36.0)
MCV: 92.7 fL (ref 80.0–100.0)
MPV: 10.1 fL (ref 7.5–12.5)
Monocytes Relative: 13.1 %
Neutro Abs: 2727 cells/uL (ref 1500–7800)
Neutrophils Relative %: 60.6 %
Platelets: 175 10*3/uL (ref 140–400)
RBC: 4.53 10*6/uL (ref 4.20–5.80)
RDW: 13.4 % (ref 11.0–15.0)
Total Lymphocyte: 23.9 %
WBC: 4.5 10*3/uL (ref 3.8–10.8)

## 2021-01-07 LAB — COMPLETE METABOLIC PANEL WITH GFR
AG Ratio: 1.8 (calc) (ref 1.0–2.5)
ALT: 26 U/L (ref 9–46)
AST: 27 U/L (ref 10–35)
Albumin: 4.4 g/dL (ref 3.6–5.1)
Alkaline phosphatase (APISO): 51 U/L (ref 35–144)
BUN: 17 mg/dL (ref 7–25)
CO2: 24 mmol/L (ref 20–32)
Calcium: 9.2 mg/dL (ref 8.6–10.3)
Chloride: 105 mmol/L (ref 98–110)
Creat: 0.9 mg/dL (ref 0.70–1.25)
GFR, Est African American: 103 mL/min/{1.73_m2} (ref >=60)
GFR, Est Non African American: 89 mL/min/{1.73_m2} (ref >=60)
Globulin: 2.5 g/dL (calc) (ref 1.9–3.7)
Glucose, Bld: 120 mg/dL — ABNORMAL HIGH (ref 65–99)
Potassium: 4.3 mmol/L (ref 3.5–5.3)
Sodium: 139 mmol/L (ref 135–146)
Total Bilirubin: 0.4 mg/dL (ref 0.2–1.2)
Total Protein: 6.9 g/dL (ref 6.1–8.1)

## 2021-01-07 LAB — PSA: PSA: 1.16 ng/mL (ref <=4.00)

## 2021-01-07 LAB — LIPID PANEL
Cholesterol: 109 mg/dL (ref <200)
HDL: 84 mg/dL (ref >=40)
LDL Cholesterol (Calc): <10 mg/dL (calc)
Non-HDL Cholesterol (Calc): 25 mg/dL (calc) (ref <130)
Total CHOL/HDL Ratio: 1.3 (calc) (ref <5.0)
Triglycerides: 87 mg/dL (ref <150)

## 2021-01-07 LAB — HEMOGLOBIN A1C
Hgb A1c MFr Bld: 6.3 % of total Hgb — ABNORMAL HIGH (ref <5.7)
Mean Plasma Glucose: 134 mg/dL
eAG (mmol/L): 7.4 mmol/L

## 2021-01-11 ENCOUNTER — Other Ambulatory Visit: Payer: Self-pay

## 2021-01-11 ENCOUNTER — Encounter: Payer: Self-pay | Admitting: Family Medicine

## 2021-01-11 ENCOUNTER — Ambulatory Visit (INDEPENDENT_AMBULATORY_CARE_PROVIDER_SITE_OTHER): Payer: Medicare HMO | Admitting: Family Medicine

## 2021-01-11 VITALS — BP 147/70 | HR 73 | Ht 68.0 in | Wt 207.6 lb

## 2021-01-11 DIAGNOSIS — E669 Obesity, unspecified: Secondary | ICD-10-CM | POA: Diagnosis not present

## 2021-01-11 DIAGNOSIS — E7800 Pure hypercholesterolemia, unspecified: Secondary | ICD-10-CM | POA: Diagnosis not present

## 2021-01-11 DIAGNOSIS — R7303 Prediabetes: Secondary | ICD-10-CM | POA: Diagnosis not present

## 2021-01-11 DIAGNOSIS — I10 Essential (primary) hypertension: Secondary | ICD-10-CM | POA: Diagnosis not present

## 2021-01-11 DIAGNOSIS — Z23 Encounter for immunization: Secondary | ICD-10-CM | POA: Diagnosis not present

## 2021-01-11 DIAGNOSIS — N401 Enlarged prostate with lower urinary tract symptoms: Secondary | ICD-10-CM | POA: Diagnosis not present

## 2021-01-11 DIAGNOSIS — N138 Other obstructive and reflux uropathy: Secondary | ICD-10-CM

## 2021-01-11 DIAGNOSIS — Z0000 Encounter for general adult medical examination without abnormal findings: Secondary | ICD-10-CM | POA: Diagnosis not present

## 2021-01-11 MED ORDER — SHINGRIX 50 MCG/0.5ML IM SUSR: INTRAMUSCULAR | 1 refills | Status: DC

## 2021-01-11 NOTE — Assessment & Plan Note (Signed)
Mild elevated A1c to 6.3 Concern with obesity, HTN  Plan:  1. Not on any therapy currently  2. Encourage improved lifestyle - low carb, low sugar diet, reduce portion size, continue improving regular exercise 3. Follow-up 6 months A1c

## 2021-01-11 NOTE — Assessment & Plan Note (Signed)
Stable chronic BPH LUTS - AUA BPH score has been stable, mild-moderate, reviewed prior score OFF Tamsulosin - Last PSA 1.16 (12/2020) stable from prior 1.3 (history of 2.8 before Finasteride) - Last DRE reported by prior PCP enlarged - No known personal/family history of prostate CA  Plan: 1. Continue current dose Finasteride 5mg  daily 2. Reviewed PSA stable (adjusted for finasteride) 3. Follow-up symptoms in future  May consider Urology in future, also considered higher sugar can cause nocturia

## 2021-01-11 NOTE — Progress Notes (Signed)
Subjective:    Patient ID: Nicholas Black, male    DOB: March 03, 1956, 65 y.o.   MRN: [id number]  Nicholas Black is a 65 y.o. male presenting on 01/11/2021 for Annual Exam   HPI  Here for Annual Physical and Lab Review.  Pre-Diabetes / Hyperglycemia Previous A1c 6.3 to 6.6 in past 2 years Last A1c 6.3 Meds: None, never on Currently not on ACEi / ARB Lifestyle: - Diet (improving diet overall, limiting carbs is his goal now) - Exercise: Increasing cardio and exercise, bicycle, gym, increasing to 40-60 miles per week Denies hypoglycemia   CHRONIC HTN: Reports he has had been treated for HTN for past >4-5 years. He checks BP regularly at home, has had some fluctuation on readings but overall controlled Home readings 140 or less Current Meds - Amlodipine $RemoveBefo'10mg'yTsfeKYqDfA$  daily, HCTZ $RemoveBef'25mg'FJetRydImp$  daily Reports good compliance, except recently did not fill meds - did not take for few days now Tolerating well, w/o complaints. Denies HA, chest pain, syncope, lightheaded, dizzy   BPH, LUTS Previously on Tamsulosin Doing well on Finasteride $RemoveBefore'5mg'zdqAeFelWSmDv$  Last lab PSA down to 1.16 on finasteride previous range 1.3 (prior to finasteride range PSA 2.8 Not seen Urologist Still has urinary frequency, urgency and nocturia - some improvement overall, needs refill Finasteride   Generalized Anxiety with Panic Admits some occasional anxiety type symptoms - related to not wanting to drive at times. Overall improved but not resolved. Still feels like he benefits from having someone with him for driving further distances.    Health Maintenance:   Colon CA Screening: Never had colonoscopy or screening. Currently asymptomatic. No known family history of colon CA. Completed Colonoscopy 02/10/20 - next due 2026 (5 years)   Prostate CA Screening: See above. PSA trend results below.   UTD COVID vaccine, updated new booster   UTD Flu vaccine   Depression screen [facility] 2/9 07/14/2020 01/15/2020 11/07/2019  Decreased Interest 0 0 0   Down, Depressed, Hopeless 0 0 0  PHQ - 2 Score 0 0 0   GAD 7 : Generalized Anxiety Score 07/14/2020  Nervous, Anxious, on Edge 1  Control/stop worrying 0  Worry too much - different things 0  Trouble relaxing 0  Restless 1  Easily annoyed or irritable 1  Afraid - awful might happen 0  Total GAD 7 Score 3  Anxiety Difficulty Not difficult at all      Past Medical History:  Diagnosis Date   Hypercholesteremia    Hypertension    Past Surgical History:  Procedure Laterality Date   COLONOSCOPY WITH PROPOFOL N/A 02/10/2020   Procedure: COLONOSCOPY WITH PROPOFOL;  Surgeon: Virgel Manifold, MD;  Location: [facility] ENDOSCOPY;  Service: Endoscopy;  Laterality: N/A;   NO PAST SURGERIES     Social History   Socioeconomic History   Marital status: Married    Spouse name: Not on file   Number of children: 1   Years of education: High School   Highest education level: High school graduate  Occupational History   Occupation: [place]  Tobacco Use   Smoking status: Former    Packs/day: 1.00    Years: 20.00    Pack years: 20.00    Types: Cigarettes    Quit date: 2016    Years since quitting: 6.4   Smokeless tobacco: Former  Scientific laboratory technician Use: Never used  Substance and Sexual Activity   Alcohol use: Yes    Alcohol/week: 3.0 standard drinks  Types: 3 Cans of beer per week   Drug use: Never   Sexual activity: Not on file  Other Topics Concern   Not on file  Social History Narrative   Not on file   Social Determinants of Health   Financial Resource Strain: Not on file  Food Insecurity: Not on file  Transportation Needs: Not on file  Physical Activity: Not on file  Stress: Not on file  Social Connections: Not on file  Intimate Partner Violence: Not on file   Family History  Problem Relation Age of Onset   Heart disease Mother    Current Outpatient Medications on File Prior to Visit  Medication Sig   amLODipine (NORVASC) 10 MG tablet  Take 1 tablet (10 mg total) by mouth daily.   BAYER ASPIRIN EC LOW DOSE 81 MG EC tablet    Cholecalciferol (VITAMIN D HIGH POTENCY) 25 MCG (1000 UT) capsule Take 2,000 Units by mouth daily.   finasteride (PROSCAR) 5 MG tablet Take 1 tablet (5 mg total) by mouth daily.   losartan (COZAAR) 50 MG tablet Take 1 tablet (50 mg total) by mouth daily.   Omega-3 Fatty Acids (FISH OIL) 1000 MG CAPS Take by mouth daily.   polyethylene glycol-electrolytes (NULYTELY) 420 g solution Prepare according to package instructions. Starting at 5:00 PM: Drink one 8 oz glass of mixture every 15 minutes until you finish half of the jug. Five hours prior to procedure, drink 8 oz glass of mixture every 15 minutes until it is all gone. Make sure you do not drink anything 4 hours prior to your procedure.   pravastatin (PRAVACHOL) 40 MG tablet Take 1 tablet (40 mg total) by mouth daily.   Sod Picosulfate-Mag Ox-Cit Acd (CLENPIQ) 10-3.5-12 MG-GM -GM/160ML SOLN Take 1 bottle at 5 PM followed by five 8 oz cups of water and repeat 5 hours before procedure.   Sodium Sulfate-Mag Sulfate-KCl (SUTAB) [phone] MG TABS At 5 PM take 12 tablets using the 8 oz cup provided in the kit drinking 5 cups of water and 5 hours before your procedure repeat the same process.   No current facility-administered medications on file prior to visit.    Review of Systems  Constitutional:  Negative for activity change, appetite change, chills, diaphoresis, fatigue and fever.  HENT:  Negative for congestion and hearing loss.   Eyes:  Negative for visual disturbance.  Respiratory:  Negative for cough, chest tightness, shortness of breath and wheezing.   Cardiovascular:  Negative for chest pain, palpitations and leg swelling.  Gastrointestinal:  Negative for abdominal pain, constipation, diarrhea, nausea and vomiting.  Genitourinary:  Negative for dysuria, frequency and hematuria.  Musculoskeletal:  Negative for arthralgias and neck pain.  Skin:   Negative for rash.  Neurological:  Negative for dizziness, weakness, light-headedness, numbness and headaches.  Hematological:  Negative for adenopathy.  Psychiatric/Behavioral:  Negative for behavioral problems, dysphoric mood and sleep disturbance.   Per HPI unless specifically indicated above      Objective:    BP (!) 147/70   Pulse 73   Ht $R'5\' 8"'zM$  (1.727 m)   Wt 207 lb 9.6 oz (94.2 kg)   SpO2 98%   BMI 31.57 kg/m   Wt Readings from Last 3 Encounters:  01/11/21 207 lb 9.6 oz (94.2 kg)  07/14/20 209 lb (94.8 kg)  02/10/20 196 lb (88.9 kg)    Physical Exam Vitals and nursing note reviewed.  Constitutional:      General: He is not in acute  distress.    Appearance: He is well-developed. He is not diaphoretic.     Comments: Well-appearing, comfortable, cooperative  HENT:     Head: Normocephalic and atraumatic.  Eyes:     General:        Right eye: No discharge.        Left eye: No discharge.     Conjunctiva/sclera: Conjunctivae normal.     Pupils: Pupils are equal, round, and reactive to light.  Neck:     Thyroid: No thyromegaly.  Cardiovascular:     Rate and Rhythm: Normal rate and regular rhythm.     Pulses: Normal pulses.     Heart sounds: Normal heart sounds. No murmur heard. Pulmonary:     Effort: Pulmonary effort is normal. No respiratory distress.     Breath sounds: Normal breath sounds. No wheezing or rales.  Abdominal:     General: Bowel sounds are normal. There is no distension.     Palpations: Abdomen is soft. There is no mass.     Tenderness: There is no abdominal tenderness.  Musculoskeletal:        General: No tenderness. Normal range of motion.     Cervical back: Normal range of motion and neck supple.     Comments: Upper / Lower Extremities: - Normal muscle tone, strength bilateral upper extremities 5/5, lower extremities 5/5  Lymphadenopathy:     Cervical: No cervical adenopathy.  Skin:    General: Skin is warm and dry.     Findings: No erythema  or rash.  Neurological:     Mental Status: He is alert and oriented to person, place, and time.     Comments: Distal sensation intact to light touch all extremities  Psychiatric:        Mood and Affect: Mood normal.        Behavior: Behavior normal.        Thought Content: Thought content normal.     Comments: Well groomed, good eye contact, normal speech and thoughts      Results for orders placed or performed in visit on 01/04/21  PSA  Result Value Ref Range   PSA 1.16 < OR = 4.00 ng/mL  Lipid panel  Result Value Ref Range   Cholesterol 109 <200 mg/dL   HDL 84 > OR = 40 mg/dL   Triglycerides 87 <150 mg/dL   LDL Cholesterol (Calc) <10 mg/dL (calc)   Total CHOL/HDL Ratio 1.3 <5.0 (calc)   Non-HDL Cholesterol (Calc) 25 <130 mg/dL (calc)  COMPLETE METABOLIC PANEL WITH GFR  Result Value Ref Range   Glucose, Bld 120 (H) 65 - 99 mg/dL   BUN 17 7 - 25 mg/dL   Creat 0.90 0.70 - 1.25 mg/dL   GFR, Est Non African American 89 > OR = 60 mL/min/1.37m2   GFR, Est African American 103 > OR = 60 mL/min/1.107m2   BUN/Creatinine Ratio NOT APPLICABLE 6 - 22 (calc)   Sodium 139 135 - 146 mmol/L   Potassium 4.3 3.5 - 5.3 mmol/L   Chloride 105 98 - 110 mmol/L   CO2 24 20 - 32 mmol/L   Calcium 9.2 8.6 - 10.3 mg/dL   Total Protein 6.9 6.1 - 8.1 g/dL   Albumin 4.4 3.6 - 5.1 g/dL   Globulin 2.5 1.9 - 3.7 g/dL (calc)   AG Ratio 1.8 1.0 - 2.5 (calc)   Total Bilirubin 0.4 0.2 - 1.2 mg/dL   Alkaline phosphatase (APISO) 51 35 - 144 U/L   AST  27 10 - 35 U/L   ALT 26 9 - 46 U/L  CBC with Differential/Platelet  Result Value Ref Range   WBC 4.5 3.8 - 10.8 Thousand/uL   RBC 4.53 4.20 - 5.80 Million/uL   Hemoglobin 13.7 13.2 - 17.1 g/dL   HCT 42.0 38.5 - 50.0 %   MCV 92.7 80.0 - 100.0 fL   MCH 30.2 27.0 - 33.0 pg   MCHC 32.6 32.0 - 36.0 g/dL   RDW 13.4 11.0 - 15.0 %   Platelets 175 140 - 400 Thousand/uL   MPV 10.1 7.5 - 12.5 fL   Neutro Abs 2,727 1,500 - 7,800 cells/uL   Lymphs Abs 1,076 850 -  3,900 cells/uL   Absolute Monocytes 590 200 - 950 cells/uL   Eosinophils Absolute 90 15 - 500 cells/uL   Basophils Absolute 18 0 - 200 cells/uL   Neutrophils Relative % 60.6 %   Total Lymphocyte 23.9 %   Monocytes Relative 13.1 %   Eosinophils Relative 2.0 %   Basophils Relative 0.4 %  Hemoglobin A1c  Result Value Ref Range   Hgb A1c MFr Bld 6.3 (H) <5.7 % of total Hgb   Mean Plasma Glucose 134 mg/dL   eAG (mmol/L) 7.4 mmol/L      Assessment & Plan:   Problem List Items Addressed This Visit     Pre-diabetes    Mild elevated A1c to 6.3 Concern with obesity, HTN  Plan:  1. Not on any therapy currently  2. Encourage improved lifestyle - low carb, low sugar diet, reduce portion size, continue improving regular exercise 3. Follow-up 6 months A1c       Obesity (BMI 30.0-34.9)    Improved weight Lifestyle diet exercise wt loss       Essential hypertension    Controlled HTN Limited outside readings - but some readings normal No known complications     Plan:  1. Continue current BP regimen - Amlodipine $RemoveBefo'10mg'ofUPevLAePy$  daily, Losartan $RemoveBeforeD'50mg'bbkaPLYjuPKhTM$  daily 2. Encourage improved lifestyle - low sodium diet, regular exercise 3. Continue monitor BP outside office, bring readings to next visit, if persistently >140/90 or new symptoms notify office sooner       Elevated cholesterol    Controlled cholesterol on statin and lifestyle Refill Statin  Encourage improved lifestyle - low carb/cholesterol, reduce portion size, continue improving regular exercise       BPH with obstruction/lower urinary tract symptoms    Stable chronic BPH LUTS - AUA BPH score has been stable, mild-moderate, reviewed prior score OFF Tamsulosin - Last PSA 1.16 (12/2020) stable from prior 1.3 (history of 2.8 before Finasteride) - Last DRE reported by prior PCP enlarged - No known personal/family history of prostate CA  Plan: 1. Continue current dose Finasteride $RemoveBeforeDE'5mg'meIbOKlFpNnMSJI$  daily 2. Reviewed PSA stable (adjusted for  finasteride) 3. Follow-up symptoms in future  May consider Urology in future, also considered higher sugar can cause nocturia       Other Visit Diagnoses     Annual physical exam    -  Primary   Need for shingles vaccine       Relevant Medications   SHINGRIX injection       Updated Health Maintenance information Reviewed recent lab results with patient Encouraged improvement to lifestyle with diet and exercise Goal of weight loss  Printed shingrix vaccine for pharmacy.  RE: Anxiety - gave him list of medications on AVS to review we can discuss at virtual visit next week.   Meds ordered this encounter  Medications   SHINGRIX injection    Sig: Inject 0.5 mL into muscle for shingles vaccine. Repeat dose in 2-6 months.    Dispense:  0.5 mL    Refill:  1     Follow up plan: Return in about 6 days (around 01/17/2021) for [facility] 6/27 11am virtual telephone visit Anxiety.  Nobie Putnam, [facility] 01/11/2021, 3:15 PM

## 2021-01-11 NOTE — Assessment & Plan Note (Signed)
Controlled cholesterol on statin and lifestyle Refill Statin  Encourage improved lifestyle - low carb/cholesterol, reduce portion size, continue improving regular exercise

## 2021-01-11 NOTE — Assessment & Plan Note (Signed)
Improved weight Lifestyle diet exercise wt loss

## 2021-01-11 NOTE — Assessment & Plan Note (Signed)
Controlled HTN Limited outside readings - but some readings normal No known complications     Plan:  1. Continue current BP regimen - Amlodipine 10mg daily, Losartan 50mg daily 2. Encourage improved lifestyle - low sodium diet, regular exercise 3. Continue monitor BP outside office, bring readings to next visit, if persistently >140/90 or new symptoms notify office sooner 

## 2021-01-11 NOTE — Patient Instructions (Addendum)
Thank you for coming to the office today.  We can do a phone call for the anxiety symptoms you experience with some panic attack, and discuss medications and prescribe something for you next week. - Buspar (very good as needed anxiety medication) - Lexapro (escitalopram) great anxiety mood depression DAILY medication - Hydroxyzine (anti histamine, allergy anxiety med can make you drowsy but calms anxiety acutely, it is similar to benadryl)  Diet Recommendations for Diabetes   REDUCE Starchy (carb) foods include: Bread, rice, pasta, potatoes, corn, crackers, bagels, muffins, all baked goods.   Protein foods include: Meat, fish, poultry, eggs, dairy foods, and beans such as pinto and kidney beans (beans also provide carbohydrate).   1. Eat at least 3 meals and 1-2 snacks per day. Never go more than 4-5 hours while awake without eating.   2. Limit starchy foods to TWO per meal and ONE per snack. ONE portion of a starchy  food is equal to the following:   - ONE slice of bread (or its equivalent, such as half of a hamburger bun).   - 1/2 cup of a "scoopable" starchy food such as potatoes or rice.   - 1 OUNCE (28 grams) of starchy snacks (crackers or pretzels, look on label).   - 15 grams of carbohydrate as shown on food label.   3. Both lunch and dinner should include a protein food, a carb food, and vegetables.   - Obtain twice as many veg's as protein or carbohydrate foods for both lunch and dinner.   - Try to keep frozen veg's on hand for a quick vegetable serving.     - Fresh or frozen veg's are best.   4. Breakfast should always include protein.      Please schedule a Follow-up Appointment to: Return in about 6 days (around 01/17/2021) for [facility] 6/27 11am virtual telephone visit Anxiety.  If you have any other questions or concerns, please feel free to call the office or send a message through [place]. You may also schedule an earlier appointment if necessary.  Additionally, you may be  receiving a survey about your experience at our office within a few days to 1 week by e-mail or mail. We value your feedback.  Nobie Putnam, DO [place]

## 2021-01-12 ENCOUNTER — Encounter: Payer: Medicare Other | Admitting: Family Medicine

## 2021-01-17 ENCOUNTER — Other Ambulatory Visit: Payer: Self-pay

## 2021-01-17 ENCOUNTER — Telehealth (INDEPENDENT_AMBULATORY_CARE_PROVIDER_SITE_OTHER): Payer: Medicare HMO | Admitting: Family Medicine

## 2021-01-17 ENCOUNTER — Encounter: Payer: Self-pay | Admitting: Family Medicine

## 2021-01-17 VITALS — Ht 68.0 in | Wt 207.0 lb

## 2021-01-17 DIAGNOSIS — F410 Panic disorder [episodic paroxysmal anxiety] without agoraphobia: Secondary | ICD-10-CM

## 2021-01-17 DIAGNOSIS — F4321 Adjustment disorder with depressed mood: Secondary | ICD-10-CM | POA: Diagnosis not present

## 2021-01-17 DIAGNOSIS — F411 Generalized anxiety disorder: Secondary | ICD-10-CM | POA: Diagnosis not present

## 2021-01-17 DIAGNOSIS — R69 Illness, unspecified: Secondary | ICD-10-CM | POA: Diagnosis not present

## 2021-01-17 MED ORDER — ESCITALOPRAM OXALATE 10 MG PO TABS: 10.0000 mg | ORAL_TABLET | Freq: Every day | ORAL | 2 refills | Status: DC

## 2021-01-17 NOTE — Progress Notes (Signed)
Virtual Visit via Telephone The purpose of this virtual visit is to provide medical care while limiting exposure to the novel coronavirus (COVID19) for both patient and office staff.  Consent was obtained for phone visit:  Yes.   Answered questions that patient had about telehealth interaction:  Yes.   I discussed the limitations, risks, security and privacy concerns of performing an evaluation and management service by telephone. I also discussed with the patient that there may be a patient responsible charge related to this service. The patient expressed understanding and agreed to proceed.  Patient Location: Home Provider Location: [facility] (Office)  Participants in virtual visit: - Patient: Nicholas Black - CMA: Orinda Kenner, [place] - Provider: Dr Parks Ranger  ---------------------------------------------------------------------- Chief Complaint  Patient presents with   Anxiety    S: Reviewed CMA documentation. I have called patient and gathered additional HPI as follows:  Generalized Anxiety with Panic Admits some occasional anxiety type symptoms - related to not wanting to drive at times feeling anxious or unsafe. Overall improved but not resolved. Still feels like he benefits from having someone with him for driving further distances. He is interested now in anxiety or mood medication to help See PHQ GAD score below Never on medication before.  Denies any fevers, chills, sweats, body ache, cough, shortness of breath, sinus pain or pressure, headache, abdominal pain, diarrhea  Past Medical History:  Diagnosis Date   Hypercholesteremia    Hypertension    Social History   Tobacco Use   Smoking status: Former    Packs/day: 1.00    Years: 20.00    Pack years: 20.00    Types: Cigarettes    Quit date: 2016    Years since quitting: 6.4   Smokeless tobacco: Former  Scientific laboratory technician Use: Never used  Substance Use Topics   Alcohol use: Yes     Alcohol/week: 3.0 standard drinks    Types: 3 Cans of beer per week   Drug use: Never    Current Outpatient Medications:    amLODipine (NORVASC) 10 MG tablet, Take 1 tablet (10 mg total) by mouth daily., Disp: 90 tablet, Rfl: 3   BAYER ASPIRIN EC LOW DOSE 81 MG EC tablet, , Disp: , Rfl:    Cholecalciferol (VITAMIN D HIGH POTENCY) 25 MCG (1000 UT) capsule, Take 2,000 Units by mouth daily., Disp: , Rfl:    escitalopram (LEXAPRO) 10 MG tablet, Take 1 tablet (10 mg total) by mouth daily., Disp: 30 tablet, Rfl: 2   finasteride (PROSCAR) 5 MG tablet, Take 1 tablet (5 mg total) by mouth daily., Disp: 90 tablet, Rfl: 3   losartan (COZAAR) 50 MG tablet, Take 1 tablet (50 mg total) by mouth daily., Disp: 90 tablet, Rfl: 3   Omega-3 Fatty Acids (FISH OIL) 1000 MG CAPS, Take by mouth daily., Disp: , Rfl:    polyethylene glycol-electrolytes (NULYTELY) 420 g solution, Prepare according to package instructions. Starting at 5:00 PM: Drink one 8 oz glass of mixture every 15 minutes until you finish half of the jug. Five hours prior to procedure, drink 8 oz glass of mixture every 15 minutes until it is all gone. Make sure you do not drink anything 4 hours prior to your procedure., Disp: 4000 mL, Rfl: 0   pravastatin (PRAVACHOL) 40 MG tablet, Take 1 tablet (40 mg total) by mouth daily., Disp: 90 tablet, Rfl: 3   SHINGRIX injection, Inject 0.5 mL into muscle for shingles vaccine. Repeat dose in  2-6 months., Disp: 0.5 mL, Rfl: 1   Sod Picosulfate-Mag Ox-Cit Acd (CLENPIQ) 10-3.5-12 MG-GM -GM/160ML SOLN, Take 1 bottle at 5 PM followed by five 8 oz cups of water and repeat 5 hours before procedure., Disp: 320 mL, Rfl: 0   Sodium Sulfate-Mag Sulfate-KCl (SUTAB) 305 206 5434 MG TABS, At 5 PM take 12 tablets using the 8 oz cup provided in the kit drinking 5 cups of water and 5 hours before your procedure repeat the same process., Disp: 24 tablet, Rfl: 0  Depression screen [facility] 2/9 01/17/2021 07/14/2020 01/15/2020  Decreased  Interest 2 0 0  Down, Depressed, Hopeless 1 0 0  PHQ - 2 Score 3 0 0  Altered sleeping 2 - -  Tired, decreased energy 2 - -  Change in appetite 1 - -  Feeling bad or failure about yourself  2 - -  Trouble concentrating 2 - -  Moving slowly or fidgety/restless 3 - -  Suicidal thoughts 1 - -  PHQ-9 Score 16 - -  Difficult doing work/chores Somewhat difficult - -   [place]-Suicide Severity Rating Scale 1) Have you wished you were dead or wished you could go to sleep and not wake up? - Yes  2) Have you had any actual thoughts of killing yourself? - No  Skip questions 3,4, 5  6) Have you ever done anything, started to do anything, or prepared to do anything to end your life? - No   GAD 7 : Generalized Anxiety Score 01/17/2021 07/14/2020  Nervous, Anxious, on Edge 1 1  Control/stop worrying 2 0  Worry too much - different things 1 0  Trouble relaxing 2 0  Restless 2 1  Easily annoyed or irritable 2 1  Afraid - awful might happen 1 0  Total GAD 7 Score 11 3  Anxiety Difficulty Not difficult at all Not difficult at all    -------------------------------------------------------------------------- O: No physical exam performed due to remote telephone encounter.  Lab results reviewed.  Recent Results (from the past 2160 hour(s))  PSA     Status: None   Collection Time: 01/05/21  8:15 AM  Result Value Ref Range   PSA 1.16 < OR = 4.00 ng/mL    Comment: The total PSA value from this assay system is  standardized against the WHO standard. The test  result will be approximately 20% lower when compared  to the equimolar-standardized total PSA (Beckman  Coulter). Comparison of serial PSA results should be  interpreted with this fact in mind. . This test was performed using the Siemens  chemiluminescent method. Values obtained from  different assay methods cannot be used interchangeably. PSA levels, regardless of value, should not be interpreted as absolute evidence of the  presence or absence of disease.   Lipid panel     Status: None   Collection Time: 01/05/21  8:15 AM  Result Value Ref Range   Cholesterol 109 <200 mg/dL   HDL 84 > OR = 40 mg/dL   Triglycerides 87 <150 mg/dL   LDL Cholesterol (Calc) <10 mg/dL (calc)    Comment: Reference range: <100 . Desirable range <100 mg/dL for primary prevention;   <70 mg/dL for patients with CHD or diabetic patients  with > or = 2 CHD risk factors. Marland Kitchen LDL-C is now calculated using the Martin-Hopkins  calculation, which is a validated novel method providing  better accuracy than the Friedewald equation in the  estimation of LDL-C.  Cresenciano Genre et al. Annamaria Helling. 2947;654(65): 2061-2068  (http://education.[facility].com/faq/FAQ164)  Total CHOL/HDL Ratio 1.3 <5.0 (calc)   Non-HDL Cholesterol (Calc) 25 <130 mg/dL (calc)    Comment: For patients with diabetes plus 1 major ASCVD risk  factor, treating to a non-HDL-C goal of <100 mg/dL  (LDL-C of <70 mg/dL) is considered a therapeutic  option.   COMPLETE METABOLIC PANEL WITH GFR     Status: Abnormal   Collection Time: 01/05/21  8:15 AM  Result Value Ref Range   Glucose, Bld 120 (H) 65 - 99 mg/dL    Comment: .            Fasting reference interval . For someone without known diabetes, a glucose value between 100 and 125 mg/dL is consistent with prediabetes and should be confirmed with a follow-up test. .    BUN 17 7 - 25 mg/dL   Creat 0.90 0.70 - 1.25 mg/dL    Comment: For patients >17 years of age, the reference limit for Creatinine is approximately 13% higher for people identified as African-American. .    GFR, Est Non African American 89 > OR = 60 mL/min/1.11m2   GFR, Est African American 103 > OR = 60 mL/min/1.56m2   BUN/Creatinine Ratio NOT APPLICABLE 6 - 22 (calc)   Sodium 139 135 - 146 mmol/L   Potassium 4.3 3.5 - 5.3 mmol/L   Chloride 105 98 - 110 mmol/L   CO2 24 20 - 32 mmol/L   Calcium 9.2 8.6 - 10.3 mg/dL   Total Protein 6.9 6.1 - 8.1  g/dL   Albumin 4.4 3.6 - 5.1 g/dL   Globulin 2.5 1.9 - 3.7 g/dL (calc)   AG Ratio 1.8 1.0 - 2.5 (calc)   Total Bilirubin 0.4 0.2 - 1.2 mg/dL   Alkaline phosphatase (APISO) 51 35 - 144 U/L   AST 27 10 - 35 U/L   ALT 26 9 - 46 U/L  CBC with Differential/Platelet     Status: None   Collection Time: 01/05/21  8:15 AM  Result Value Ref Range   WBC 4.5 3.8 - 10.8 Thousand/uL   RBC 4.53 4.20 - 5.80 Million/uL   Hemoglobin 13.7 13.2 - 17.1 g/dL   HCT 42.0 38.5 - 50.0 %   MCV 92.7 80.0 - 100.0 fL   MCH 30.2 27.0 - 33.0 pg   MCHC 32.6 32.0 - 36.0 g/dL   RDW 13.4 11.0 - 15.0 %   Platelets 175 140 - 400 Thousand/uL   MPV 10.1 7.5 - 12.5 fL   Neutro Abs 2,727 1,500 - 7,800 cells/uL   Lymphs Abs 1,076 850 - 3,900 cells/uL   Absolute Monocytes 590 200 - 950 cells/uL   Eosinophils Absolute 90 15 - 500 cells/uL   Basophils Absolute 18 0 - 200 cells/uL   Neutrophils Relative % 60.6 %   Total Lymphocyte 23.9 %   Monocytes Relative 13.1 %   Eosinophils Relative 2.0 %   Basophils Relative 0.4 %  Hemoglobin A1c     Status: Abnormal   Collection Time: 01/05/21  8:15 AM  Result Value Ref Range   Hgb A1c MFr Bld 6.3 (H) <5.7 % of total Hgb    Comment: For someone without known diabetes, a hemoglobin  A1c value between 5.7% and 6.4% is consistent with prediabetes and should be confirmed with a  follow-up test. . For someone with known diabetes, a value <7% indicates that their diabetes is well controlled. A1c targets should be individualized based on duration of diabetes, age, comorbid conditions, and other considerations. . This assay  result is consistent with an increased risk of diabetes. . Currently, no consensus exists regarding use of hemoglobin A1c for diagnosis of diabetes for children. .    Mean Plasma Glucose 134 mg/dL   eAG (mmol/L) 7.4 mmol/L    -------------------------------------------------------------------------- A&P:  Problem List Items Addressed This Visit      Generalized anxiety disorder with panic attacks - Primary   Relevant Medications   escitalopram (LEXAPRO) 10 MG tablet   Other Visit Diagnoses     Adjustment disorder with depressed mood       Relevant Medications   escitalopram (LEXAPRO) 10 MG tablet      Suspected new dx GAD now affecting his ability to drive at times. However this has Physical symptoms of anxiety/mood and panic attacks. Not actually endorsing depression Elevated PHQ GAD scores - No prior medications  - No prior dx / Psych / counseling  Plan: 1. Discussion on new diagnosis anxiety, management, complications 2. Start Escitalopram 61m daily AM with food, counseling on potential side effects risks, reviewed possible GI intolerance, insomnia (although likely to improve this given anxiety likely source of insomnia), sexual dysfunction, may need titrate dose to 20 in future 3. Advised recommend therapy / counseling in future 4. Follow-up 3 month anxiety, med adjust, GAD7/PHQ9  Discussed Buspar as add on in future or monotherapy, however will opt for SSRI initially given mixed mood as well.   Meds ordered this encounter  Medications   escitalopram (LEXAPRO) 10 MG tablet    Sig: Take 1 tablet (10 mg total) by mouth daily.    Dispense:  30 tablet    Refill:  2    Follow-up: - Return in 3 months for mood/anxiety med  Patient verbalizes understanding with the above medical recommendations including the limitation of remote medical advice.  Specific follow-up and call-back criteria were given for patient to follow-up or seek medical care more urgently if needed.   - Time spent in direct consultation with patient on phone: 8 minutes   ANobie Putnam [employer] 01/17/2021, 11:14 AM

## 2021-01-17 NOTE — Patient Instructions (Addendum)
Thank you for coming to the office today.  Start Escitalopram 10mg  daily may take a few weeks to take effect in the body up to 3-4 weeks for full benefit usually.  May adjust dose in future if not quite effective  Can add Buspar as well for anxiety panic if needed as a back up plan.   Please schedule a Follow-up Appointment to: Return in about 3 months (around 04/19/2021) for 3 month follow-up Anxiety/Mood PHQ GAD med.  If you have any other questions or concerns, please feel free to call the office or send a message through [place]. You may also schedule an earlier appointment if necessary.  Additionally, you may be receiving a survey about your experience at our office within a few days to 1 week by e-mail or mail. We value your feedback.  Nobie Putnam, DO [place]
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
## 2021-03-08 ENCOUNTER — Telehealth: Payer: Self-pay | Admitting: Family Medicine

## 2021-03-08 NOTE — Telephone Encounter (Signed)
Unable to leave a message for patient to call back and schedule Medicare Annual Wellness Visit (AWV) to be done virtually or by telephone.  No hx of AWV eligible as of 02/22/20  Please schedule at anytime with [facility].      40 Minutes appointment   Any questions, please call me at [phone]

## 2021-03-10 DIAGNOSIS — Z800 Family history of malignant neoplasm of digestive organs: Secondary | ICD-10-CM | POA: Diagnosis not present

## 2021-03-10 DIAGNOSIS — Z6830 Body mass index (BMI) 30.0-30.9, adult: Secondary | ICD-10-CM | POA: Diagnosis not present

## 2021-03-10 DIAGNOSIS — E669 Obesity, unspecified: Secondary | ICD-10-CM | POA: Diagnosis not present

## 2021-03-10 DIAGNOSIS — G8929 Other chronic pain: Secondary | ICD-10-CM | POA: Diagnosis not present

## 2021-03-10 DIAGNOSIS — Z8249 Family history of ischemic heart disease and other diseases of the circulatory system: Secondary | ICD-10-CM | POA: Diagnosis not present

## 2021-03-10 DIAGNOSIS — R69 Illness, unspecified: Secondary | ICD-10-CM | POA: Diagnosis not present

## 2021-03-10 DIAGNOSIS — I10 Essential (primary) hypertension: Secondary | ICD-10-CM | POA: Diagnosis not present

## 2021-03-10 DIAGNOSIS — N400 Enlarged prostate without lower urinary tract symptoms: Secondary | ICD-10-CM | POA: Diagnosis not present

## 2021-03-10 DIAGNOSIS — E785 Hyperlipidemia, unspecified: Secondary | ICD-10-CM | POA: Diagnosis not present

## 2021-03-10 DIAGNOSIS — Z823 Family history of stroke: Secondary | ICD-10-CM | POA: Diagnosis not present

## 2021-07-01 ENCOUNTER — Ambulatory Visit (INDEPENDENT_AMBULATORY_CARE_PROVIDER_SITE_OTHER): Payer: Medicare HMO

## 2021-07-01 ENCOUNTER — Other Ambulatory Visit: Payer: Self-pay

## 2021-07-01 DIAGNOSIS — Z23 Encounter for immunization: Secondary | ICD-10-CM

## 2021-07-27 ENCOUNTER — Other Ambulatory Visit: Payer: Self-pay | Admitting: Family Medicine

## 2021-07-27 DIAGNOSIS — N401 Enlarged prostate with lower urinary tract symptoms: Secondary | ICD-10-CM

## 2021-07-27 DIAGNOSIS — I10 Essential (primary) hypertension: Secondary | ICD-10-CM

## 2021-07-27 DIAGNOSIS — N138 Other obstructive and reflux uropathy: Secondary | ICD-10-CM

## 2021-07-28 ENCOUNTER — Other Ambulatory Visit: Payer: Self-pay | Admitting: Family Medicine

## 2021-07-28 DIAGNOSIS — N138 Other obstructive and reflux uropathy: Secondary | ICD-10-CM

## 2021-07-28 DIAGNOSIS — I10 Essential (primary) hypertension: Secondary | ICD-10-CM

## 2021-07-28 DIAGNOSIS — N401 Enlarged prostate with lower urinary tract symptoms: Secondary | ICD-10-CM

## 2021-07-28 NOTE — Telephone Encounter (Signed)
Requested Prescriptions  Pending Prescriptions Disp Refills   finasteride (PROSCAR) 5 MG tablet [Pharmacy Med Name: FINASTERIDE 5 MG TAB] 90 tablet 3    Sig: TAKE 1 TABLET BY MOUTH DAILY     Urology: 5-alpha Reductase Inhibitors Passed - 07/27/2021 10:23 AM      Passed - Valid encounter within last 12 months    Recent Outpatient Visits          6 months ago Generalized anxiety disorder with panic attacks   [place], DO   6 months ago Annual physical exam   [facility] Olin Hauser, DO   1 year ago [place], DO   1 year ago Annual physical exam   [facility] Olin Hauser, DO   1 year ago BPH with obstruction/lower urinary tract symptoms   [place], FNP              amLODipine ([place]) 10 MG tablet [Pharmacy Med Name: AMLODIPINE BESYLATE 10 MG TAB] 90 tablet 3    Sig: TAKE 1 TABLET BY MOUTH DAILY     Cardiovascular:  Calcium Channel Blockers Failed - 07/27/2021 10:23 AM      Failed - Last BP in normal range    BP Readings from Last 1 Encounters:  01/11/21 (!) 147/70         Failed - Valid encounter within last 6 months    Recent Outpatient Visits          6 months ago Generalized anxiety disorder with panic attacks   [place], DO   6 months ago Annual physical exam   [facility] Olin Hauser, DO   1 year ago Pre-diabetes   [place], DO   1 year ago Annual physical exam   [facility] Olin Hauser, DO   1 year 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 Courtesy refill.

## 2021-07-28 NOTE — Telephone Encounter (Signed)
Requested Prescriptions  Pending Prescriptions Disp Refills   finasteride (PROSCAR) 5 MG tablet [Pharmacy Med Name: FINASTERIDE 5 MG TAB] 90 tablet 1    Sig: TAKE 1 TABLET BY MOUTH DAILY     Urology: 5-alpha Reductase Inhibitors Passed - 07/28/2021 10:19 AM      Passed - Valid encounter within last 12 months    Recent Outpatient Visits          6 months ago Generalized anxiety disorder with panic attacks   [place], DO   6 months ago Annual physical exam   [facility] Olin Hauser, DO   1 year ago [place], DO   1 year ago Annual physical exam   [facility] Olin Hauser, DO   1 year ago BPH with obstruction/lower urinary tract symptoms   [place], FNP              amLODipine ([place]) 10 MG tablet [Pharmacy Med Name: AMLODIPINE BESYLATE 10 MG TAB] 90 tablet     Sig: TAKE 1 TABLET BY MOUTH DAILY     Cardiovascular:  Calcium Channel Blockers Failed - 07/28/2021 10:19 AM      Failed - Last BP in normal range    BP Readings from Last 1 Encounters:  01/11/21 (!) 147/70         Failed - Valid encounter within last 6 months    Recent Outpatient Visits          6 months ago Generalized anxiety disorder with panic attacks   [facility], Devonne Doughty, DO   6 months ago Annual physical exam   [facility] Olin Hauser, DO   1 year ago Pre-diabetes   [place], DO   1 year ago Annual physical exam   [facility] Olin Hauser, DO   1 year 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

## 2021-10-27 ENCOUNTER — Telehealth: Payer: Self-pay | Admitting: Family Medicine

## 2021-10-27 NOTE — Telephone Encounter (Signed)
N/A unable to leave a message for patient to call back and schedule Medicare Annual Wellness Visit (AWV) to be done virtually or by telephone. ? ?No hx of AWV eligible as of 02/22/20 ? ?Please schedule at anytime with [facility].     ? ? ?Any questions, please call me at [phone]  ?

## 2021-11-02 ENCOUNTER — Telehealth: Payer: Self-pay | Admitting: Pharmacist

## 2021-11-02 NOTE — Telephone Encounter (Signed)
?  Chronic Care Management  ? ?Outreach Note ? ?11/02/2021 ?Name: Nicholas Black MRN: [id number] DOB: 04-10-1956 ? ?Patient appearing on report for [employer] [place] [employer] Hypertension Control due to last documented ambulatory blood pressure of 147/70 on 01/11/2021. No next appointment with PCP currently scheduled. ? ?Reached patient by telephone today, but he states that now is not a good time to talk. Schedule telephone appointment ? ? ?Follow Up Plan: CM Pharmacist will outreach to patient by telephone again on 11/04/2021 at 8:30 am ? ?Wallace Cullens, PharmD, BCACP ?Clinical Pharmacist ?[facility] ?[phone] ? ?

## 2021-11-04 ENCOUNTER — Telehealth: Payer: Medicare HMO

## 2021-11-04 ENCOUNTER — Telehealth: Payer: Self-pay | Admitting: Pharmacist

## 2021-11-04 NOTE — Telephone Encounter (Signed)
?  Chronic Care Management  ? ?Outreach Note ? ?11/04/2021 ?Name: Nicholas Black MRN: [id number] DOB: 07-03-1956 ? ?Patient appearing on report for [place] [employer] Hypertension Control due to last documented ambulatory blood pressure of 147/70 on 01/11/2021. No next appointment with PCP currently scheduled. ? ?Was unable to reach patient via telephone today and unable to leave a message as no voicemail picks up ? ? ? ?Follow Up Plan: CM Pharmacist will outreach to patient by telephone again within the next 30 days ? ?Wallace Cullens, PharmD, BCACP ?Clinical Pharmacist ?[place] [facility] ?[phone] ? ?

## 2021-11-23 ENCOUNTER — Other Ambulatory Visit: Payer: Self-pay | Admitting: Family Medicine

## 2021-11-23 DIAGNOSIS — E7800 Pure hypercholesterolemia, unspecified: Secondary | ICD-10-CM

## 2021-11-24 NOTE — Telephone Encounter (Signed)
Requested Prescriptions  ?Pending Prescriptions Disp Refills  ?? pravastatin (PRAVACHOL) 40 MG tablet [Pharmacy Med Name: PRAVASTATIN SODIUM 40 MG TAB] 90 tablet 0  ?  Sig: TAKE 1 TABLET BY MOUTH DAILY  ?  ? Cardiovascular:  Antilipid - Statins Failed - 11/23/2021  2:08 PM  ?  ?  Failed - Lipid Panel in normal range within the last 12 months  ?  Cholesterol  ?Date Value Ref Range Status  ?01/05/2021 109 <200 mg/dL Final  ? ?LDL Cholesterol (Calc)  ?Date Value Ref Range Status  ?01/05/2021 <10 mg/dL (calc) Final  ?  Comment:  ?  Reference range: <100 ?Marland Kitchen ?Desirable range <100 mg/dL for primary prevention;   ?<70 mg/dL for patients with CHD or diabetic patients  ?with > or = 2 CHD risk factors. ?. ?LDL-C is now calculated using the Martin-Hopkins  ?calculation, which is a validated novel method providing  ?better accuracy than the Friedewald equation in the  ?estimation of LDL-C.  ?Cresenciano Genre et al. Annamaria Helling. 4166;063(01): 2061-2068  ?(http://education.[facility].com/faq/FAQ164) ?  ? ?HDL  ?Date Value Ref Range Status  ?01/05/2021 84 > OR = 40 mg/dL Final  ? ?Triglycerides  ?Date Value Ref Range Status  ?01/05/2021 87 <150 mg/dL Final  ? ?  ?  ?  Passed - Patient is not pregnant  ?  ?  Passed - Valid encounter within last 12 months  ?  Recent Outpatient Visits   ?      ? 10 months ago Generalized anxiety disorder with panic attacks  ? [place], DO  ? 10 months ago Annual physical exam  ? [place], DO  ? 1 year ago Pre-diabetes  ? [place], DO  ? 1 year ago Annual physical exam  ? [place], DO  ? 2 years ago BPH with obstruction/lower urinary tract symptoms  ? [facility], Lupita Raider, FNP  ?  ?  ? ?  ?  ?  ? ? ?

## 2022-03-10 ENCOUNTER — Other Ambulatory Visit: Payer: Self-pay | Admitting: Family Medicine

## 2022-03-10 DIAGNOSIS — I10 Essential (primary) hypertension: Secondary | ICD-10-CM

## 2022-03-10 NOTE — Telephone Encounter (Signed)
Requested medication (s) are due for refill today: yes  Requested medication (s) are on the active medication list: yes  Last refill:  amlodipine 07/28/21 #30/0, losartan 07/14/20 #30/3  Future visit scheduled: no  Notes to clinic:  pt is overdue for OV and updated labs. Called pt but unable to LVM. Please advise      Requested Prescriptions  Pending Prescriptions Disp Refills   amLODipine (NORVASC) 10 MG tablet [Pharmacy Med Name: AMLODIPINE BESYLATE 10 MG TAB] 30 tablet 0    Sig: TAKE 1 TABLET BY MOUTH DAILY     Cardiovascular: Calcium Channel Blockers 2 Failed - 03/10/2022  1:47 PM      Failed - Last BP in normal range    BP Readings from Last 1 Encounters:  01/11/21 (!) 147/70         Failed - Valid encounter within last 6 months    Recent Outpatient Visits           1 year ago Generalized anxiety disorder with panic attacks   [place], DO   1 year ago Annual physical exam   [facility] Olin Hauser, DO   1 year ago Pre-diabetes   [place], DO   2 years ago Annual physical exam   [facility] Olin Hauser, DO   2 years ago BPH with obstruction/lower urinary tract symptoms   [facility], Lupita Raider, [place]              Passed - Last Heart Rate in normal range    Pulse Readings from Last 1 Encounters:  01/11/21 73          losartan (COZAAR) 50 MG tablet [Pharmacy Med Name: LOSARTAN POTASSIUM 50 MG TAB] 90 tablet 3    Sig: TAKE 1 TABLET BY MOUTH DAILY     Cardiovascular:  Angiotensin Receptor Blockers Failed - 03/10/2022  1:47 PM      Failed - Cr in normal range and within 180 days    Creat  Date Value Ref Range Status  01/05/2021 0.90 0.70 - 1.25 mg/dL Final    Comment:    For patients >81 years of age, the reference limit for Creatinine is approximately 13% higher for people identified as  African-American. .          Failed - K in normal range and within 180 days    Potassium  Date Value Ref Range Status  01/05/2021 4.3 3.5 - 5.3 mmol/L Final  09/13/2013 4.4 3.5 - 5.1 mmol/L Final         Failed - Last BP in normal range    BP Readings from Last 1 Encounters:  01/11/21 (!) 147/70         Failed - Valid encounter within last 6 months    Recent Outpatient Visits           1 year ago Generalized anxiety disorder with panic attacks   [place], DO   1 year ago Annual physical exam   [place], DO   1 year ago [place], DO   2 years ago Annual physical exam   [facility] Olin Hauser, DO   2 years ago BPH with obstruction/lower urinary tract symptoms   [facility], [place]  Jerilynn Mages, FNP              Passed - Patient is not pregnant

## 2022-03-13 NOTE — Telephone Encounter (Signed)
I have re ordered once, but he needs to schedule office visit for any further refills  Nobie Putnam, [facility] 03/13/2022, 12:47 PM

## 2022-03-14 ENCOUNTER — Other Ambulatory Visit: Payer: Self-pay | Admitting: Family Medicine

## 2022-03-14 DIAGNOSIS — E7800 Pure hypercholesterolemia, unspecified: Secondary | ICD-10-CM

## 2022-03-15 NOTE — Telephone Encounter (Signed)
Called, spoke with pt. Schedule OV for 03/28/22. Will refill medication.  Requested Prescriptions  Pending Prescriptions Disp Refills  . pravastatin (PRAVACHOL) 40 MG tablet [Pharmacy Med Name: PRAVASTATIN SODIUM 40 MG TAB] 90 tablet 0    Sig: TAKE 1 TABLET BY MOUTH DAILY     Cardiovascular:  Antilipid - Statins Failed - 03/14/2022 11:17 AM      Failed - Valid encounter within last 12 months    Recent Outpatient Visits          1 year ago Generalized anxiety disorder with panic attacks   [place], DO   1 year ago Annual physical exam   [place], DO   1 year ago Pre-diabetes   [place], Devonne Doughty, DO   2 years ago Annual physical exam   [facility] Olin Hauser, DO   2 years ago BPH with obstruction/lower urinary tract symptoms   [facility], Lupita Raider, FNP      Future Appointments            In 1 week [place], Devonne Doughty, DO [facility], [facility]           Failed - Lipid Panel in normal range within the last 12 months    Cholesterol  Date Value Ref Range Status  01/05/2021 109 <200 mg/dL Final   LDL Cholesterol (Calc)  Date Value Ref Range Status  01/05/2021 <10 mg/dL (calc) Final    Comment:    Reference range: <100 . Desirable range <100 mg/dL for primary prevention;   <70 mg/dL for patients with CHD or diabetic patients  with > or = 2 CHD risk factors. Marland Kitchen LDL-C is now calculated using the Martin-Hopkins  calculation, which is a validated novel method providing  better accuracy than the Friedewald equation in the  estimation of LDL-C.  Cresenciano Genre et al. Annamaria Helling. 8381;840(37): 2061-2068  (http://education.[facility].com/faq/FAQ164)    HDL  Date Value Ref Range Status  01/05/2021 84 > OR = 40 mg/dL Final   Triglycerides  Date Value Ref Range Status  01/05/2021 87 <150 mg/dL  Final         Passed - Patient is not pregnant

## 2022-03-28 ENCOUNTER — Ambulatory Visit (INDEPENDENT_AMBULATORY_CARE_PROVIDER_SITE_OTHER): Payer: Medicare HMO | Admitting: Family Medicine

## 2022-03-28 ENCOUNTER — Encounter: Payer: Self-pay | Admitting: Family Medicine

## 2022-03-28 VITALS — BP 131/74 | HR 75 | Ht 69.0 in | Wt 205.6 lb

## 2022-03-28 DIAGNOSIS — M25512 Pain in left shoulder: Secondary | ICD-10-CM | POA: Diagnosis not present

## 2022-03-28 DIAGNOSIS — N138 Other obstructive and reflux uropathy: Secondary | ICD-10-CM | POA: Diagnosis not present

## 2022-03-28 DIAGNOSIS — I10 Essential (primary) hypertension: Secondary | ICD-10-CM | POA: Diagnosis not present

## 2022-03-28 DIAGNOSIS — N401 Enlarged prostate with lower urinary tract symptoms: Secondary | ICD-10-CM | POA: Diagnosis not present

## 2022-03-28 DIAGNOSIS — G8929 Other chronic pain: Secondary | ICD-10-CM

## 2022-03-28 DIAGNOSIS — M7552 Bursitis of left shoulder: Secondary | ICD-10-CM | POA: Diagnosis not present

## 2022-03-28 DIAGNOSIS — E7800 Pure hypercholesterolemia, unspecified: Secondary | ICD-10-CM | POA: Diagnosis not present

## 2022-03-28 DIAGNOSIS — Z23 Encounter for immunization: Secondary | ICD-10-CM | POA: Diagnosis not present

## 2022-03-28 MED ORDER — FINASTERIDE 5 MG PO TABS: 5.0000 mg | ORAL_TABLET | Freq: Every day | ORAL | 3 refills | Status: DC

## 2022-03-28 MED ORDER — NAPROXEN 500 MG PO TABS: 500.0000 mg | ORAL_TABLET | Freq: Two times a day (BID) | ORAL | 2 refills | Status: DC

## 2022-03-28 MED ORDER — PRAVASTATIN SODIUM 40 MG PO TABS: 40.0000 mg | ORAL_TABLET | Freq: Every day | ORAL | 3 refills | Status: DC

## 2022-03-28 MED ORDER — CYCLOBENZAPRINE HCL 10 MG PO TABS: 10.0000 mg | ORAL_TABLET | Freq: Three times a day (TID) | ORAL | 2 refills | Status: DC | PRN

## 2022-03-28 MED ORDER — LOSARTAN POTASSIUM 50 MG PO TABS: 50.0000 mg | ORAL_TABLET | Freq: Every day | ORAL | 3 refills | Status: DC

## 2022-03-28 MED ORDER — AMLODIPINE BESYLATE 10 MG PO TABS: 10.0000 mg | ORAL_TABLET | Freq: Every day | ORAL | 3 refills | Status: DC

## 2022-03-28 NOTE — Assessment & Plan Note (Signed)
Stable chronic BPH LUTS Due for upcoming labs later this year  - AUA BPH score has been stable, mild-moderate, reviewed prior score OFF Tamsulosin - Last PSA 1.16 (12/2020) stable from prior 1.3 (history of 2.8 before Finasteride) - Last DRE reported by prior PCP enlarged - No known personal/family history of prostate CA  Plan: 1. Continue current dose Finasteride '5mg'$  daily 2. Reviewed PSA stable (adjusted for finasteride) 3. Follow-up symptoms in future  May consider Urology in future

## 2022-03-28 NOTE — Assessment & Plan Note (Signed)
Controlled HTN Limited outside readings - but some readings normal No known complications     Plan:  1. Continue current BP regimen - Amlodipine '10mg'$  daily, Losartan '50mg'$  daily 2. Encourage improved lifestyle - low sodium diet, regular exercise 3. Continue monitor BP outside office, bring readings to next visit, if persistently >140/90 or new symptoms notify office sooner

## 2022-03-28 NOTE — Progress Notes (Signed)
Subjective:    Patient ID: Nicholas Black, male    DOB: 10/16/1955, 66 y.o.   MRN: [id number]  Nicholas Black is a 66 y.o. male presenting on 03/28/2022 for Shoulder Pain   HPI  Left Shoulder Chronic Pain Reports approximately 3-4 years ago, he believes may have torn L rotator cuff, at one time he had difficulty lifting arm. 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 - Taking OTC Tylenol 2-3 per dose, 1-2 times  Anxiety - remission Controlled now, off all medication.  CHRONIC HTN: Controlled BP lately. Needs future refills Home readings reviewed Current Meds - Amlodipine '10mg'$  daily, Losartan '50mg'$  daily Reports good compliance, except recently did not fill meds - did not take for few days now Tolerating well, w/o complaints. Denies HA, chest pain, syncope, lightheaded, dizzy   BPH, LUTS Previously on Tamsulosin Doing well on Finasteride '5mg'$  Last lab PSA down to 1.16 on finasteride previous range 1.3 (prior to finasteride range PSA 2.8 Not seen Urologist Still has urinary frequency, urgency and nocturia - some improvement overall, needs refill Finasteride   Future re-schedule for annual physical  Health Maintenance: Prevnar20 today Future Flu when high dose vaccine 65+ available Future shingles vaccine     03/28/2022   10:29 AM 01/17/2021   10:58 AM 07/14/2020    3:39 PM  Depression screen PHQ 2/9  Decreased Interest 0 2 0  Down, Depressed, Hopeless 1 1 0  PHQ - 2 Score 1 3 0  Altered sleeping 0 2   Tired, decreased energy 1 2   Change in appetite 0 1   Feeling bad or failure about yourself  1 2   Trouble concentrating 0 2   Moving slowly or fidgety/restless 0 3   Suicidal thoughts 1 1   PHQ-9 Score 4 16   Difficult doing work/chores Not difficult at all Somewhat difficult     Social History   Tobacco Use   Smoking status: Former    Packs/day: 1.00    Years: 20.00    Total pack years: 20.00    Types: Cigarettes     Quit date: 2016    Years since quitting: 7.6   Smokeless tobacco: Former  Scientific laboratory technician Use: Never used  Substance Use Topics   Alcohol use: Yes    Alcohol/week: 3.0 standard drinks of alcohol    Types: 3 Cans of beer per week   Drug use: Never    Review of Systems Per HPI unless specifically indicated above     Objective:    BP 131/74   Pulse 75   Ht '5\' 9"'$  (1.753 m)   Wt 205 lb 9.6 oz (93.3 kg)   SpO2 100%   BMI 30.36 kg/m   Wt Readings from Last 3 Encounters:  03/28/22 205 lb 9.6 oz (93.3 kg)  01/17/21 207 lb (93.9 kg)  01/11/21 207 lb 9.6 oz (94.2 kg)    Physical Exam Vitals and nursing note reviewed.  Constitutional:      General: He is not in acute distress.    Appearance: He is well-developed. He is not diaphoretic.     Comments: Well-appearing, comfortable, cooperative  HENT:     Head: Normocephalic and atraumatic.  Eyes:     General:        Right eye: No discharge.        Left eye: No discharge.     Conjunctiva/sclera: Conjunctivae normal.  Neck:     Thyroid: No thyromegaly.  Cardiovascular:     Rate and Rhythm: Normal rate and regular rhythm.     Pulses: Normal pulses.     Heart sounds: Normal heart sounds. No murmur heard. Pulmonary:     Effort: Pulmonary effort is normal. No respiratory distress.     Breath sounds: Normal breath sounds. No wheezing or rales.  Musculoskeletal:        General: Normal range of motion.     Cervical back: Normal range of motion and neck supple.     Comments: LEFT Shoulder Inspection: Normal appearance bilateral symmetrical Palpation: Non-tender to palpation over anterior, lateral, or posterior shoulder  ROM: Full intact active ROM forward flexion, abduction, internal / external rotation, symmetrical - Some pain with full flexion Special Testing: Rotator cuff testing negative for weakness with supraspinatus full can and empty can test, Impingement testing positive L side Strength: Normal strength 5/5 flex/ext,  ext rot / int rot, grip, rotator cuff str testing. Neurovascular: Distally intact pulses, sensation to light touch   Lymphadenopathy:     Cervical: No cervical adenopathy.  Skin:    General: Skin is warm and dry.     Findings: No erythema or rash.  Neurological:     Mental Status: He is alert and oriented to person, place, and time. Mental status is at baseline.  Psychiatric:        Behavior: Behavior normal.     Comments: Well groomed, good eye contact, normal speech and thoughts      Results for orders placed or performed in visit on 01/04/21  PSA  Result Value Ref Range   PSA 1.16 < OR = 4.00 ng/mL  Lipid panel  Result Value Ref Range   Cholesterol 109 <200 mg/dL   HDL 84 > OR = 40 mg/dL   Triglycerides 87 <150 mg/dL   LDL Cholesterol (Calc) <10 mg/dL (calc)   Total CHOL/HDL Ratio 1.3 <5.0 (calc)   Non-HDL Cholesterol (Calc) 25 <130 mg/dL (calc)  COMPLETE METABOLIC PANEL WITH GFR  Result Value Ref Range   Glucose, Bld 120 (H) 65 - 99 mg/dL   BUN 17 7 - 25 mg/dL   Creat 0.90 0.70 - 1.25 mg/dL   GFR, Est Non African American 89 > OR = 60 mL/min/1.43m   GFR, Est African American 103 > OR = 60 mL/min/1.748m  BUN/Creatinine Ratio NOT APPLICABLE 6 - 22 (calc)   Sodium 139 135 - 146 mmol/L   Potassium 4.3 3.5 - 5.3 mmol/L   Chloride 105 98 - 110 mmol/L   CO2 24 20 - 32 mmol/L   Calcium 9.2 8.6 - 10.3 mg/dL   Total Protein 6.9 6.1 - 8.1 g/dL   Albumin 4.4 3.6 - 5.1 g/dL   Globulin 2.5 1.9 - 3.7 g/dL (calc)   AG Ratio 1.8 1.0 - 2.5 (calc)   Total Bilirubin 0.4 0.2 - 1.2 mg/dL   Alkaline phosphatase (APISO) 51 35 - 144 U/L   AST 27 10 - 35 U/L   ALT 26 9 - 46 U/L  CBC with Differential/Platelet  Result Value Ref Range   WBC 4.5 3.8 - 10.8 Thousand/uL   RBC 4.53 4.20 - 5.80 Million/uL   Hemoglobin 13.7 13.2 - 17.1 g/dL   HCT 42.0 38.5 - 50.0 %   MCV 92.7 80.0 - 100.0 fL   MCH 30.2 27.0 - 33.0 pg   MCHC 32.6 32.0 - 36.0 g/dL   RDW 13.4 11.0 - 15.0 %  Platelets  175 140 - 400 Thousand/uL   MPV 10.1 7.5 - 12.5 fL   Neutro Abs 2,727 1,500 - 7,800 cells/uL   Lymphs Abs 1,076 850 - 3,900 cells/uL   Absolute Monocytes 590 200 - 950 cells/uL   Eosinophils Absolute 90 15 - 500 cells/uL   Basophils Absolute 18 0 - 200 cells/uL   Neutrophils Relative % 60.6 %   Total Lymphocyte 23.9 %   Monocytes Relative 13.1 %   Eosinophils Relative 2.0 %   Basophils Relative 0.4 %  Hemoglobin A1c  Result Value Ref Range   Hgb A1c MFr Bld 6.3 (H) <5.7 % of total Hgb   Mean Plasma Glucose 134 mg/dL   eAG (mmol/L) 7.4 mmol/L      Assessment & Plan:   Problem List Items Addressed This Visit     BPH with obstruction/lower urinary tract symptoms    Stable chronic BPH LUTS Due for upcoming labs later this year  - AUA BPH score has been stable, mild-moderate, reviewed prior score OFF Tamsulosin - Last PSA 1.16 (12/2020) stable from prior 1.3 (history of 2.8 before Finasteride) - Last DRE reported by prior PCP enlarged - No known personal/family history of prostate CA  Plan: 1. Continue current dose Finasteride '5mg'$  daily 2. Reviewed PSA stable (adjusted for finasteride) 3. Follow-up symptoms in future  May consider Urology in future      Relevant Medications   finasteride (PROSCAR) 5 MG tablet   Elevated cholesterol   Relevant Medications   amLODipine (NORVASC) 10 MG tablet   pravastatin (PRAVACHOL) 40 MG tablet   losartan (COZAAR) 50 MG tablet   Essential hypertension    Controlled HTN Limited outside readings - but some readings normal No known complications     Plan:  1. Continue current BP regimen - Amlodipine '10mg'$  daily, Losartan '50mg'$  daily 2. Encourage improved lifestyle - low sodium diet, regular exercise 3. Continue monitor BP outside office, bring readings to next visit, if persistently >140/90 or new symptoms notify office sooner      Relevant Medications   amLODipine (NORVASC) 10 MG tablet   pravastatin (PRAVACHOL) 40 MG tablet    losartan (COZAAR) 50 MG tablet   Other Visit Diagnoses     Chronic left shoulder pain    -  Primary   Relevant Medications   cyclobenzaprine (FLEXERIL) 10 MG tablet   naproxen (NAPROSYN) 500 MG tablet   Needs flu shot       Need for Streptococcus pneumoniae vaccination       Relevant Orders   Pneumococcal conjugate vaccine 20-valent (Completed)   Bursitis of left shoulder       Relevant Medications   cyclobenzaprine (FLEXERIL) 10 MG tablet   naproxen (NAPROSYN) 500 MG tablet       Consistent with subacute on chronic LEFT-shoulder bursitis vs rotator cuff tendinopathy with some reduced active ROM but without significant evidence of muscle tear (no weakness). Known repetitive overhead/strenuous activity as likely etiology but no acute injury Likely underlying osteoarthritis age 23  Plan: 1. Start rx Naproxen '500mg'$  twice daily (with food) for 2 weeks, then as needed - Add Flexeril PRN caution sedation 2. May take Tylenol Ex Str 1-2 q 6 hr PRN 3. Relative rest but keep shoulder mobile, demonstrated ROM exercises, avoid heavy lifting 4. May try heating pad PRN 5. Follow-up 4-6 weeks if not improved for re-evaluation, consider referral to Physical Therapy, X-rays, and or subacromial steroid injection  HLD Refill Pravastatin  PreDM Counseling on  lifestyle diet exercise  Meds ordered this encounter  Medications   amLODipine (NORVASC) 10 MG tablet    Sig: Take 1 tablet (10 mg total) by mouth daily.    Dispense:  90 tablet    Refill:  3   pravastatin (PRAVACHOL) 40 MG tablet    Sig: Take 1 tablet (40 mg total) by mouth daily.    Dispense:  90 tablet    Refill:  3   losartan (COZAAR) 50 MG tablet    Sig: Take 1 tablet (50 mg total) by mouth daily.    Dispense:  90 tablet    Refill:  3   finasteride (PROSCAR) 5 MG tablet    Sig: Take 1 tablet (5 mg total) by mouth daily.    Dispense:  90 tablet    Refill:  3   cyclobenzaprine (FLEXERIL) 10 MG tablet    Sig: Take 1 tablet  (10 mg total) by mouth 3 (three) times daily as needed for muscle spasms.    Dispense:  30 tablet    Refill:  2   naproxen (NAPROSYN) 500 MG tablet    Sig: Take 1 tablet (500 mg total) by mouth 2 (two) times daily with a meal. For 2-4 weeks then as needed    Dispense:  60 tablet    Refill:  2      Follow up plan: Return in about 6 weeks (around 05/09/2022) for 4-6 weeks Annual Physical AM fasting lab only AFTER.   Nobie Putnam, [facility] 03/28/2022, 9:54 AM

## 2022-03-28 NOTE — Patient Instructions (Addendum)
Thank you for coming to the office today.  Most likely you have bursitis of your shoulder. This is inflammation of the shoulder joint caused most often by arthritis or wear and tear. Often it can flare up to cause bursitis due to repetitive activities or other triggers. It may take time to heal, possibly 2 to 6 weeks, and it is important to avoid over use of shoulder especially above head motions that can re-aggravate the problem.  Recommend trial of Anti-inflammatory with Naproxen (Naprosyn) '500mg'$  tabs - take one with food and plenty of water TWICE daily every day (breakfast and dinner), for next 1 to 2 weeks, then you may take only as needed  - DO NOT TAKE any ibuprofen, aleve, motrin while you are taking this medicine  Recommend to start taking Tylenol Extra Strength '500mg'$  tabs - take 1 to 2 tabs per dose (max '1000mg'$ ) every 6-8 hours for pain (take regularly, don't skip a dose for next 7 days), max 24 hour daily dose is 6 tablets or '3000mg'$ . In the future you can repeat the same everyday Tylenol course for 1-2 weeks at a time.   Start Cyclobenzapine (Flexeril) '10mg'$  tablets (muscle relaxant) - start with half (cut) to one whole pill at night for muscle relaxant - may make you sedated or sleepy (be careful driving or working on this) if tolerated you can take half to whole tab 2 to 3 times daily or every 8 hours as needed   Also we can refer you to Physical Therapy if just need to improve on range of motion.  Lastly if not improved by about 4-6 weeks - or just need treatment sooner - call and return for a Steroid Injection in shoulder.  AFTER next apt - DUE for FASTING BLOOD WORK (no food or drink after midnight before the lab appointment, only water or coffee without cream/sugar on the morning of)  - Make sure Lab Only appointment is at about 1 week before your next appointment, so that results will be available  For Lab Results, once available within 2-3 days of blood draw, you can can log in  to MyChart online to view your results and a brief explanation. Also, we can discuss results at next follow-up visit.   Please schedule a Follow-up Appointment to: Return in about 6 weeks (around 05/09/2022) for 4-6 weeks Annual Physical AM fasting lab only AFTER.  If you have any other questions or concerns, please feel free to call the office or send a message through [place]. You may also schedule an earlier appointment if necessary.  Additionally, you may be receiving a survey about your experience at our office within a few days to 1 week by e-mail or mail. We value your feedback.  Nobie Putnam, DO [place]

## 2022-05-11 ENCOUNTER — Encounter: Payer: Medicare HMO | Admitting: Family Medicine

## 2022-06-05 DIAGNOSIS — M13812 Other specified arthritis, left shoulder: Secondary | ICD-10-CM | POA: Diagnosis not present

## 2022-06-05 DIAGNOSIS — M25512 Pain in left shoulder: Secondary | ICD-10-CM | POA: Diagnosis not present

## 2022-06-05 DIAGNOSIS — M13811 Other specified arthritis, right shoulder: Secondary | ICD-10-CM | POA: Diagnosis not present

## 2022-06-05 DIAGNOSIS — M25511 Pain in right shoulder: Secondary | ICD-10-CM | POA: Diagnosis not present

## 2022-06-19 DIAGNOSIS — M25512 Pain in left shoulder: Secondary | ICD-10-CM | POA: Diagnosis not present

## 2022-06-19 DIAGNOSIS — M25511 Pain in right shoulder: Secondary | ICD-10-CM | POA: Diagnosis not present

## 2022-07-20 ENCOUNTER — Telehealth: Payer: Self-pay | Admitting: Family Medicine

## 2022-07-20 NOTE — Telephone Encounter (Signed)
N/A unable to leave a message for patient to call back and schedule Medicare Annual Wellness Visit (AWV) to be done virtually or by telephone.  No hx of AWV eligible as of 02/22/20  Please schedule at anytime with [facility].      79 Minutes appointment   Any questions, please call me at [phone]

## 2022-09-25 DIAGNOSIS — M7542 Impingement syndrome of left shoulder: Secondary | ICD-10-CM | POA: Diagnosis not present

## 2022-09-25 DIAGNOSIS — M7541 Impingement syndrome of right shoulder: Secondary | ICD-10-CM | POA: Diagnosis not present

## 2022-09-25 DIAGNOSIS — M25511 Pain in right shoulder: Secondary | ICD-10-CM | POA: Diagnosis not present

## 2022-09-25 DIAGNOSIS — M25512 Pain in left shoulder: Secondary | ICD-10-CM | POA: Diagnosis not present

## 2022-09-28 ENCOUNTER — Ambulatory Visit (INDEPENDENT_AMBULATORY_CARE_PROVIDER_SITE_OTHER): Payer: Medicare HMO | Admitting: Family Medicine

## 2022-09-28 ENCOUNTER — Encounter: Payer: Self-pay | Admitting: Family Medicine

## 2022-09-28 ENCOUNTER — Other Ambulatory Visit: Payer: Self-pay | Admitting: Family Medicine

## 2022-09-28 VITALS — BP 134/88 | HR 73 | Ht 69.0 in | Wt 196.6 lb

## 2022-09-28 DIAGNOSIS — R81 Glycosuria: Secondary | ICD-10-CM

## 2022-09-28 DIAGNOSIS — R7303 Prediabetes: Secondary | ICD-10-CM

## 2022-09-28 DIAGNOSIS — N138 Other obstructive and reflux uropathy: Secondary | ICD-10-CM

## 2022-09-28 DIAGNOSIS — I10 Essential (primary) hypertension: Secondary | ICD-10-CM

## 2022-09-28 DIAGNOSIS — R350 Frequency of micturition: Secondary | ICD-10-CM

## 2022-09-28 DIAGNOSIS — Z0000 Encounter for general adult medical examination without abnormal findings: Secondary | ICD-10-CM

## 2022-09-28 DIAGNOSIS — E7800 Pure hypercholesterolemia, unspecified: Secondary | ICD-10-CM

## 2022-09-28 LAB — POCT URINALYSIS DIPSTICK
Glucose, UA: POSITIVE — AB
Leukocytes, UA: NEGATIVE
Nitrite, UA: NEGATIVE
Protein, UA: POSITIVE — AB
Spec Grav, UA: 1.02 (ref 1.010–1.025)
Urobilinogen, UA: 0.2 E.U./dL
pH, UA: 6 (ref 5.0–8.0)

## 2022-09-28 MED ORDER — METFORMIN HCL ER 500 MG PO TB24: 500.0000 mg | ORAL_TABLET | Freq: Every day | ORAL | 2 refills | Status: DC

## 2022-09-28 NOTE — Patient Instructions (Addendum)
Thank you for coming to the office today.  I believe the urinary frequency is from high glucose sugar. Probably from the injections, steroid can raise sugar You have been Pre Diabetic before, we need to treat this to avoid diabetes  Start Metformin XR 24 hr pill = '500mg'$  once daily with meal usually largest meal or morning.  Max dose is 2 pills at once, in the future if you still need help.  ---------------------  We will check blood in 3 months  Diet Recommendations for Preventing Diabetes   REDUCE Starchy (carb) foods include: Bread, rice, pasta, potatoes, corn, crackers, bagels, muffins, all baked goods.   FRUITS - LIMIT these HIGH sugar/carb fruits = Pineapple, Watermelon, Bananas - OKAY with these MEDIUM sugar/carb fruits = Citrus, Oranges, Grapes - PREFER these LOW sugar/carb fruits = Apples, Berries, Pears, Plums  Protein foods include: Meat, fish, poultry, eggs, dairy foods, and beans such as pinto and kidney beans (beans also provide carbohydrate).   1. Eat at least 3 meals and 1-2 snacks per day. Never go more than 4-5 hours while awake without eating.   2. Limit starchy foods to TWO per meal and ONE per snack. ONE portion of a starchy  food is equal to the following:   - ONE slice of bread (or its equivalent, such as half of a hamburger bun).   - 1/2 cup of a "scoopable" starchy food such as potatoes or rice.   - 1 OUNCE (28 grams) of starchy snacks (crackers or pretzels, look on label).   - 15 grams of carbohydrate as shown on food label.   3. Both lunch and dinner should include a protein food, a carb food, and vegetables.   - Obtain twice as many veg's as protein or carbohydrate foods for both lunch and dinner.   - Try to keep frozen veg's on hand for a quick vegetable serving.     - Fresh or frozen veg's are best.   4. Breakfast should always include protein.    DUE for FASTING BLOOD WORK (no food or drink after midnight before the lab appointment, only water or  coffee without cream/sugar on the morning of)  SCHEDULE "Lab Only" visit in the morning at the clinic for lab draw in 3 MONTHS   - Make sure Lab Only appointment is at about 1 week before your next appointment, so that results will be available  For Lab Results, once available within 2-3 days of blood draw, you can can log in to MyChart online to view your results and a brief explanation. Also, we can discuss results at next follow-up visit.    Please schedule a Follow-up Appointment to: Return in about 3 months (around 12/29/2022) for 3 month fasting lab only then 1 week later Annual Physical.  If you have any other questions or concerns, please feel free to call the office or send a message through [place]. You may also schedule an earlier appointment if necessary.  Additionally, you may be receiving a survey about your experience at our office within a few days to 1 week by e-mail or mail. We value your feedback.  Nobie Putnam, DO [place]

## 2022-09-28 NOTE — Progress Notes (Signed)
Subjective:    Patient ID: Nicholas Black, male    DOB: 06-15-1956, 67 y.o.   MRN: [id number]  Nicholas Black is a 67 y.o. male presenting on 09/28/2022 for Urinary Frequency   HPI  Urinary Frequency Reports recent problem worsening past several weeks and now worse this week with Urinary frequency and Polydipsia increased thirst. Drinking a lot more water and peeing often. Not having burning or blood in urine or fever or other concerns. He has not had any UTI history See below on blood sugar Here for urine test today  History of Bilateral Shoulder Pain Followed by Orthopedics Has had some steroid injections bilateral shoulders 16 weeks ago and again on Monday  Pre-Diabetes / Hyperglycemia Previous A1c 6.3 to 6.6 in past 2 years Last A1c 6.3 Not checking sugar Meds: None, never on Currently not on ACEi / ARB Lifestyle: - Diet not always adhering but has improved previously. Goal to limit sugars - Exercise: Increasing cardio and exercise, bicycle, gym, increasing to 40-60 miles per week Denies hypoglycemia      09/28/2022    9:52 AM 03/28/2022   10:29 AM 01/17/2021   10:58 AM  Depression screen PHQ 2/9  Decreased Interest 0 0 2  Down, Depressed, Hopeless 0 1 1  PHQ - 2 Score 0 1 3  Altered sleeping 0 0 2  Tired, decreased energy 0 1 2  Change in appetite 0 0 1  Feeling bad or failure about yourself  0 1 2  Trouble concentrating 0 0 2  Moving slowly or fidgety/restless 0 0 3  Suicidal thoughts 0 1 1  PHQ-9 Score 0 4 16  Difficult doing work/chores Not difficult at all Not difficult at all Somewhat difficult    Social History   Tobacco Use   Smoking status: Former    Packs/day: 1.00    Years: 20.00    Total pack years: 20.00    Types: Cigarettes    Quit date: 2016    Years since quitting: 8.1   Smokeless tobacco: Former  Scientific laboratory technician Use: Never used  Substance Use Topics   Alcohol use: Yes    Alcohol/week: 3.0 standard drinks of alcohol    Types: 3  Cans of beer per week   Drug use: Never    Review of Systems Per HPI unless specifically indicated above     Objective:    BP 134/88   Pulse 73   Ht '5\' 9"'$  (1.753 m)   Wt 196 lb 9.6 oz (89.2 kg)   SpO2 100%   BMI 29.03 kg/m   Wt Readings from Last 3 Encounters:  09/28/22 196 lb 9.6 oz (89.2 kg)  03/28/22 205 lb 9.6 oz (93.3 kg)  01/17/21 207 lb (93.9 kg)    Physical Exam Vitals and nursing note reviewed.  Constitutional:      General: He is not in acute distress.    Appearance: Normal appearance. He is well-developed. He is not diaphoretic.     Comments: Well-appearing, comfortable, cooperative  HENT:     Head: Normocephalic and atraumatic.  Eyes:     General:        Right eye: No discharge.        Left eye: No discharge.     Conjunctiva/sclera: Conjunctivae normal.  Cardiovascular:     Rate and Rhythm: Normal rate.  Pulmonary:     Effort: Pulmonary effort is normal.  Skin:    General: Skin is warm and  dry.     Findings: No erythema or rash.  Neurological:     Mental Status: He is alert and oriented to person, place, and time.  Psychiatric:        Mood and Affect: Mood normal.        Behavior: Behavior normal.        Thought Content: Thought content normal.     Comments: Well groomed, good eye contact, normal speech and thoughts    Results for orders placed or performed in visit on 09/28/22  POCT Urinalysis Dipstick  Result Value Ref Range   Color, UA Yellow    Clarity, UA Cloudy    Glucose, UA Positive (A) Negative   Bilirubin, UA Small (A)    Ketones, UA Trace (A)    Spec Grav, UA 1.020 1.010 - 1.025   Blood, UA Moderate ++ (A)    pH, UA 6.0 5.0 - 8.0   Protein, UA Positive (A) Negative   Urobilinogen, UA 0.2 0.2 or 1.0 E.U./dL   Nitrite, UA Negative    Leukocytes, UA Negative Negative   Appearance     Odor        Assessment & Plan:   Problem List Items Addressed This Visit     Essential hypertension   Pre-diabetes   Relevant Medications    metFORMIN (GLUCOPHAGE-XR) 500 MG 24 hr tablet   Other Visit Diagnoses     Urinary frequency    -  Primary   Relevant Orders   POCT Urinalysis Dipstick (Completed)   Urine Culture   Glucosuria       Relevant Medications   metFORMIN (GLUCOPHAGE-XR) 500 MG 24 hr tablet       I believe the urinary frequency is from high glucose sugar. Glucosuria on UA Dipstick Order Urine Culture as precaution, but no symptoms of UTI  Likely hyperglycemia due to steroid injections multiple bilateral shoulder shots in past 3 months  Known Pre Diabetes A1c 6.1 to 6.3 range, history remotely 4 yr ago 6.6 Overdue for lab, he just received steroid injection 3-4 days ago. Will defer lab today  Treat PreDM and Hyperglycemia now  Start Metformin XR 24 hr pill = '500mg'$  once daily with meal usually largest meal or morning. Max dose is 2 pills at once, in the future if needed  Meds ordered this encounter  Medications   metFORMIN (GLUCOPHAGE-XR) 500 MG 24 hr tablet    Sig: Take 1 tablet (500 mg total) by mouth daily with breakfast.    Dispense:  30 tablet    Refill:  2      Follow up plan: Return in about 3 months (around 12/29/2022) for 3 month fasting lab only then 1 week later Annual Physical.  Future labs ordered for 12/21/22   Nobie Putnam, [facility] 09/28/2022, 10:06 AM

## 2022-09-29 ENCOUNTER — Other Ambulatory Visit: Payer: Self-pay

## 2022-09-29 ENCOUNTER — Emergency Department
Admission: EM | Admit: 2022-09-29 | Discharge: 2022-09-29 | Disposition: A | Discharge status: Home / Self Care | Payer: Medicare HMO | Attending: Emergency Medicine | Admitting: Emergency Medicine

## 2022-09-29 DIAGNOSIS — R739 Hyperglycemia, unspecified: Secondary | ICD-10-CM | POA: Diagnosis present

## 2022-09-29 DIAGNOSIS — E1165 Type 2 diabetes mellitus with hyperglycemia: Secondary | ICD-10-CM | POA: Insufficient documentation

## 2022-09-29 DIAGNOSIS — R7303 Prediabetes: Secondary | ICD-10-CM

## 2022-09-29 DIAGNOSIS — R81 Glycosuria: Secondary | ICD-10-CM

## 2022-09-29 LAB — URINALYSIS, ROUTINE W REFLEX MICROSCOPIC
Bacteria, UA: NONE SEEN
Bilirubin Urine: NEGATIVE
Glucose, UA: >=500 mg/dL — AB
Hgb urine dipstick: NEGATIVE
Ketones, ur: NEGATIVE mg/dL
Leukocytes,Ua: NEGATIVE
Nitrite: NEGATIVE
Protein, ur: NEGATIVE mg/dL
Specific Gravity, Urine: 1.031 — ABNORMAL HIGH (ref 1.005–1.030)
pH: 6 (ref 5.0–8.0)

## 2022-09-29 LAB — BASIC METABOLIC PANEL
Anion gap: 12 (ref 5–15)
BUN: 29 mg/dL — ABNORMAL HIGH (ref 8–23)
CO2: 22 mmol/L (ref 22–32)
Calcium: 9.3 mg/dL (ref 8.9–10.3)
Chloride: 93 mmol/L — ABNORMAL LOW (ref 98–111)
Creatinine, Ser: 1.13 mg/dL (ref 0.61–1.24)
GFR, Estimated: >60 mL/min (ref >60)
Glucose, Bld: 655 mg/dL (ref 70–99)
Potassium: 4.7 mmol/L (ref 3.5–5.1)
Sodium: 127 mmol/L — ABNORMAL LOW (ref 135–145)

## 2022-09-29 LAB — CBC
HCT: 41.4 % (ref 39.0–52.0)
Hemoglobin: 13.9 g/dL (ref 13.0–17.0)
MCH: 29.3 pg (ref 26.0–34.0)
MCHC: 33.6 g/dL (ref 30.0–36.0)
MCV: 87.3 fL (ref 80.0–100.0)
Platelets: 228 10*3/uL (ref 150–400)
RBC: 4.74 MIL/uL (ref 4.22–5.81)
RDW: 12.9 % (ref 11.5–15.5)
WBC: 7.8 10*3/uL (ref 4.0–10.5)
nRBC: 0 % (ref 0.0–0.2)

## 2022-09-29 LAB — CBG MONITORING, ED
Glucose-Capillary: >600 mg/dL (ref 70–99)
Glucose-Capillary: 441 mg/dL — ABNORMAL HIGH (ref 70–99)
Glucose-Capillary: 311 mg/dL — ABNORMAL HIGH (ref 70–99)

## 2022-09-29 MED ORDER — SODIUM CHLORIDE 0.9 % IV BOLUS
1000.0000 mL | Freq: Once | INTRAVENOUS | Status: AC
  Administered 2022-09-29: 1000 mL via INTRAVENOUS

## 2022-09-29 MED ORDER — BLOOD GLUCOSE MONITORING SUPPL DEVI: 1.0000 | Freq: Three times a day (TID) | 0 refills | Status: AC

## 2022-09-29 MED ORDER — INSULIN ASPART 100 UNIT/ML IJ SOLN
6.0000 [IU] | Freq: Once | INTRAMUSCULAR | Status: AC
  Administered 2022-09-29: 6 [IU] via INTRAVENOUS
  Filled 2022-09-29: qty 1

## 2022-09-29 MED ORDER — LANCETS MISC. MISC: 1.0000 | Freq: Three times a day (TID) | 0 refills | Status: AC

## 2022-09-29 MED ORDER — METFORMIN HCL 500 MG PO TABS: 500.0000 mg | ORAL_TABLET | Freq: Two times a day (BID) | ORAL | 2 refills | Status: DC

## 2022-09-29 MED ORDER — BLOOD GLUCOSE TEST VI STRP: 1.0000 | ORAL_STRIP | Freq: Three times a day (TID) | 0 refills | Status: AC

## 2022-09-29 MED ORDER — METFORMIN HCL 500 MG PO TABS
500.0000 mg | ORAL_TABLET | Freq: Once | ORAL | Status: AC
  Administered 2022-09-29: 500 mg via ORAL
  Filled 2022-09-29: qty 1

## 2022-09-29 MED ORDER — LANCET DEVICE MISC: 1.0000 | Freq: Three times a day (TID) | 0 refills | Status: AC

## 2022-09-29 NOTE — ED Triage Notes (Signed)
Pt to ED via POV from home. Pt accompanied by friend who states pt has seemed more lethargic and checked his sugar and it read high. Pt also received 21 steroid shots on Monday. Pt with no hx of DM.

## 2022-09-29 NOTE — Discharge Instructions (Signed)
Please seek medical attention for any high fevers, chest pain, shortness of breath, change in behavior, persistent vomiting, bloody stool or any other new or concerning symptoms.  

## 2022-09-29 NOTE — ED Provider Notes (Signed)
[facility] Provider Note    Event Date/Time   First MD Initiated Contact with Patient 09/29/22 1845     (approximate)   History   Hyperglycemia   HPI  Nicholas Black is a 67 y.o. male who presents to the emergency department today because of concerns for elevated blood sugar.  The patient states that he has been feeling fatigued over the past few days.  Additionally has noticed increased urination and thirst.  He states that he was told that he was prediabetic by his doctor.  He denies any recent symptoms.  Did however recently get 2 steroid injections for orthopedic issues.      Physical Exam   Triage Vital Signs: ED Triage Vitals [09/29/22 1841]  Enc Vitals Group     BP (!) 150/86     Pulse Rate 84     Resp 18     Temp 97.9 F (36.6 C)     Temp Source Oral     SpO2 100 %     Weight      Height      Head Circumference      Peak Flow      Pain Score 0     Pain Loc      Pain Edu?      Excl. in [place]?     Most recent vital signs: Vitals:   09/29/22 1841 09/29/22 1912  BP: (!) 150/86   Pulse: 84   Resp: 18   Temp: 97.9 F (36.6 C)   SpO2: 100% 95%   General: Awake, alert, oriented. CV:  Good peripheral perfusion. Regular rate and rhythm. Resp:  Normal effort. Lungs clear. Abd:  No distention.     ED Results / Procedures / Treatments   Labs (all labs ordered are listed, but only abnormal results are displayed) Labs Reviewed  BASIC METABOLIC PANEL - Abnormal; Notable for the following components:      Result Value   Sodium 127 (*)    Chloride 93 (*)    Glucose, Bld 655 (*)    BUN 29 (*)    All other components within normal limits  URINALYSIS, ROUTINE W REFLEX MICROSCOPIC - Abnormal; Notable for the following components:   Color, Urine COLORLESS (*)    APPearance CLEAR (*)    Specific Gravity, Urine 1.031 (*)    Glucose, UA >=500 (*)    All other components within normal limits  CBG MONITORING, ED - Abnormal; Notable for  the following components:   Glucose-Capillary >600 (*)    All other components within normal limits  CBG MONITORING, ED - Abnormal; Notable for the following components:   Glucose-Capillary 441 (*)    All other components within normal limits  CBG MONITORING, ED - Abnormal; Notable for the following components:   Glucose-Capillary 311 (*)    All other components within normal limits  CBC  HEMOGLOBIN A1C  CBG MONITORING, ED     EKG  None   RADIOLOGY None    PROCEDURES:  Critical Care performed: No   MEDICATIONS ORDERED IN ED: Medications  metFORMIN (GLUCOPHAGE) tablet 500 mg (has no administration in time range)  sodium chloride 0.9 % bolus 1,000 mL (0 mLs Intravenous Stopped 09/29/22 2047)  sodium chloride 0.9 % bolus 1,000 mL (0 mLs Intravenous Stopped 09/29/22 2117)  sodium chloride 0.9 % bolus 1,000 mL (1,000 mLs Intravenous New Bag/Given 09/29/22 2117)  insulin aspart (novoLOG) injection 6 Units (6 Units Intravenous Given 09/29/22  2118)     IMPRESSION / MDM / ASSESSMENT AND PLAN / ED COURSE  I reviewed the triage vital signs and the nursing notes.                              Differential diagnosis includes, but is not limited to, DKA, hyperglycemia, DM  Patient's presentation is most consistent with acute presentation with potential threat to life or bodily function.  Patient presented to the emergency department today because of concerns for hyperglycemia.  Patient's blood sugar was quite elevated here.  Fortunately no anion gap or other findings to suggest DKA.  Patient was given IV fluids here as well as small dose of insulin and his sugars did start coming down.  Patient clinically stated he felt quite a bit better.  This time I do think patient has diabetes.  Did discuss this with the patient.  Will plan on starting metformin.  Discussed with patient importance of following up with further management with primary care.     FINAL CLINICAL IMPRESSION(S) / ED  DIAGNOSES   Final diagnoses:  Type 2 diabetes mellitus with hyperglycemia, without long-term current use of insulin ([place])     Note:  This document was prepared using Dragon voice recognition software and may include unintentional dictation errors.    Nance Pear, MD 09/29/22 [phone]

## 2022-09-30 LAB — URINE CULTURE
MICRO NUMBER:: 14663408
SPECIMEN QUALITY:: ADEQUATE

## 2022-10-02 ENCOUNTER — Encounter: Payer: Self-pay | Admitting: Family Medicine

## 2022-10-02 ENCOUNTER — Ambulatory Visit (INDEPENDENT_AMBULATORY_CARE_PROVIDER_SITE_OTHER): Payer: Medicare HMO | Admitting: Family Medicine

## 2022-10-02 ENCOUNTER — Other Ambulatory Visit: Payer: Self-pay | Admitting: Family Medicine

## 2022-10-02 VITALS — BP 134/80 | HR 86 | Ht 69.0 in | Wt 196.0 lb

## 2022-10-02 DIAGNOSIS — E1169 Type 2 diabetes mellitus with other specified complication: Secondary | ICD-10-CM | POA: Diagnosis not present

## 2022-10-02 DIAGNOSIS — I10 Essential (primary) hypertension: Secondary | ICD-10-CM | POA: Diagnosis not present

## 2022-10-02 DIAGNOSIS — B9620 Unspecified Escherichia coli [E. coli] as the cause of diseases classified elsewhere: Secondary | ICD-10-CM

## 2022-10-02 DIAGNOSIS — E785 Hyperlipidemia, unspecified: Secondary | ICD-10-CM

## 2022-10-02 LAB — POCT GLYCOSYLATED HEMOGLOBIN (HGB A1C): Hemoglobin A1C: 13.4 % — AB (ref 4.0–5.6)

## 2022-10-02 MED ORDER — CEPHALEXIN 500 MG PO CAPS: 500.0000 mg | ORAL_CAPSULE | Freq: Three times a day (TID) | ORAL | 0 refills | Status: DC

## 2022-10-02 NOTE — Assessment & Plan Note (Signed)
Controlled HTN Limited outside readings - but some readings normal No known complications     Plan:  1. Continue current BP regimen - Amlodipine 10mg daily, Losartan 50mg daily 2. Encourage improved lifestyle - low sodium diet, regular exercise 3. Continue monitor BP outside office, bring readings to next visit, if persistently >140/90 or new symptoms notify office sooner 

## 2022-10-02 NOTE — Assessment & Plan Note (Signed)
New diagnosis w Type 2 Diabetes, A1c now >13 Various factors, previously PreDM, has had multiple cortisone injections and lifestyle / diet  Concern with obesity, HTN  Plan:   Start Metformin XR '500mg'$  daily with meal in morning, after 1-2 weeks you can increase to take TWO pills in the morning with Metformin XR '500mg'$  x 2 = '1000mg'$  daily  Free sample Ozempic injection GLP1 = 6 week sample - dial the dose, 0.'25mg'$  weekly x 4 weeks - Increase to 0.'5mg'$  weekly for x 2 weeks = finished.  We can order Ozempic pen for you, and continue on 0.'5mg'$  weekly Notify office 4-5 weeks into sample Referral to CCM RN / Pharmacy for assistance with new dx diabetes Future consider [facility] Encourage improved lifestyle - low carb, low sugar diet, reduce portion size, continue improving regular exercise

## 2022-10-02 NOTE — Progress Notes (Signed)
Subjective:    Patient ID: Nicholas Black, male    DOB: 20-Jun-1956, 67 y.o.   MRN: [id number]  Nicholas Black is a 67 y.o. male presenting on 10/02/2022 for Hospitalization Follow-up   HPI   ED FOLLOW-UP VISIT  Hospital/Location: [facility] Date of ED Visit: 09/29/22  Reason for Presenting to ED: Hyperglycemia, Glucosuria  FOLLOW-UP  - ED provider note and record have been reviewed - Patient presents today about 3 days after recent ED visit. Brief summary of recent course, patient had symptoms of urinary frequency, glucosuria, polydipsia, fatigue, seen in office by me on 09/28/22 with plan to start Metformin, presumed Type 2 Diabetes, but we were already scheduled for upcoming A1c. He presented to ED on 3/8, testing in ED with glucose >600, treated with insulin and acute therapy, rehydration, labs reviewed, discharged to home.  - Today reports overall has done well after discharge from ED. No worsening symptoms or new concerns.  - New medications on discharge: Metformin - Changes to current meds on discharge: None  I have reviewed the discharge medication list, and have reconciled the current and discharge medications today.      10/02/2022    2:58 PM 09/28/2022    9:52 AM 03/28/2022   10:29 AM  Depression screen PHQ 2/9  Decreased Interest 0 0 0  Down, Depressed, Hopeless 0 0 1  PHQ - 2 Score 0 0 1  Altered sleeping 2 0 0  Tired, decreased energy 2 0 1  Change in appetite 0 0 0  Feeling bad or failure about yourself  0 0 1  Trouble concentrating 0 0 0  Moving slowly or fidgety/restless 1 0 0  Suicidal thoughts 0 0 1  PHQ-9 Score 5 0 4  Difficult doing work/chores Somewhat difficult Not difficult at all Not difficult at all    Social History   Tobacco Use   Smoking status: Former    Packs/day: 1.00    Years: 20.00    Total pack years: 20.00    Types: Cigarettes    Quit date: 2016    Years since quitting: 8.1   Smokeless tobacco: Former  Scientific laboratory technician Use: Never  used  Substance Use Topics   Alcohol use: Yes    Alcohol/week: 3.0 standard drinks of alcohol    Types: 3 Cans of beer per week   Drug use: Never    Review of Systems Per HPI unless specifically indicated above     Objective:    BP 134/80   Pulse 86   Ht '5\' 9"'$  (1.753 m)   Wt 196 lb (88.9 kg)   SpO2 99%   BMI 28.94 kg/m   Wt Readings from Last 3 Encounters:  10/02/22 196 lb (88.9 kg)  09/28/22 196 lb 9.6 oz (89.2 kg)  03/28/22 205 lb 9.6 oz (93.3 kg)    Physical Exam Vitals and nursing note reviewed.  Constitutional:      General: He is not in acute distress.    Appearance: He is well-developed. He is not diaphoretic.     Comments: Well-appearing, comfortable, cooperative  HENT:     Head: Normocephalic and atraumatic.  Eyes:     General:        Right eye: No discharge.        Left eye: No discharge.     Conjunctiva/sclera: Conjunctivae normal.  Neck:     Thyroid: No thyromegaly.  Cardiovascular:     Rate and Rhythm: Normal rate  and regular rhythm.     Pulses: Normal pulses.     Heart sounds: Normal heart sounds. No murmur heard. Pulmonary:     Effort: Pulmonary effort is normal. No respiratory distress.     Breath sounds: Normal breath sounds. No wheezing or rales.  Musculoskeletal:        General: Normal range of motion.     Cervical back: Normal range of motion and neck supple.  Lymphadenopathy:     Cervical: No cervical adenopathy.  Skin:    General: Skin is warm and dry.     Findings: No erythema or rash.  Neurological:     Mental Status: He is alert and oriented to person, place, and time. Mental status is at baseline.  Psychiatric:        Behavior: Behavior normal.     Comments: Well groomed, good eye contact, normal speech and thoughts    Results for orders placed or performed in visit on 10/02/22  POCT glycosylated hemoglobin (Hb A1C)  Result Value Ref Range   Hemoglobin A1C 13.4 (A) 4.0 - 5.6 %      Assessment & Plan:   Problem List Items  Addressed This Visit     Essential hypertension    Controlled HTN Limited outside readings - but some readings normal No known complications     Plan:  1. Continue current BP regimen - Amlodipine '10mg'$  daily, Losartan '50mg'$  daily 2. Encourage improved lifestyle - low sodium diet, regular exercise 3. Continue monitor BP outside office, bring readings to next visit, if persistently >140/90 or new symptoms notify office sooner      Relevant Orders   AMB Referral to Chronic Care Management Services   Hyperlipidemia associated with type 2 diabetes mellitus ([place])   Relevant Orders   AMB Referral to Chronic Care Management Services   Type 2 diabetes mellitus with other specified complication ([place]) - Primary    New diagnosis w Type 2 Diabetes, A1c now >13 Various factors, previously PreDM, has had multiple cortisone injections and lifestyle / diet  Concern with obesity, HTN  Plan:   Start Metformin XR '500mg'$  daily with meal in morning, after 1-2 weeks you can increase to take TWO pills in the morning with Metformin XR '500mg'$  x 2 = '1000mg'$  daily  Free sample Ozempic injection GLP1 = 6 week sample - dial the dose, 0.'25mg'$  weekly x 4 weeks - Increase to 0.'5mg'$  weekly for x 2 weeks = finished.  We can order Ozempic pen for you, and continue on 0.'5mg'$  weekly Notify office 4-5 weeks into sample Referral to CCM RN / Pharmacy for assistance with new dx diabetes Future consider [facility] Encourage improved lifestyle - low carb, low sugar diet, reduce portion size, continue improving regular exercise      Relevant Orders   POCT glycosylated hemoglobin (Hb A1C) (Completed)   AMB Referral to Chronic Care Management Services    No orders of the defined types were placed in this encounter.  Orders Placed This Encounter  Procedures   AMB Referral to Chronic Care Management Services    Referral Priority:   Routine    Referral Type:   Consultation    Referral Reason:   Chronic  Care Management (CCM)    Number of Visits Requested:   1   POCT glycosylated hemoglobin (Hb A1C)      Follow up plan: Return for re-schedule next lab/physical for 2 weeks later, due to A1c today.   Nobie Putnam, DO  [facility] 10/02/2022, 2:58 PM

## 2022-10-02 NOTE — Patient Instructions (Addendum)
Thank you for coming to the office today.  Start Metformin XR '500mg'$  daily with meal in morning, after 1-2 weeks you can increase to take TWO pills in the morning with Metformin XR '500mg'$  x 2 = '1000mg'$  daily  Free sample Ozempic injection GLP1 = 6 week sample - dial the dose, 0.'25mg'$  weekly x 4 weeks - Increase to 0.'5mg'$  weekly for x 2 weeks = finished.  We can order Ozempic pen for you, and continue on 0.'5mg'$  weekly  Recent Labs    10/02/22 1517  HGBA1C 13.4*   I will refer you to our team and Nurse Pam and Pharmacist Grayland Ormond will call you at some point to discuss the blood sugar further.  Return in 3 months for labs and physical  Diet Recommendations for Diabetes   REDUCE Starchy (carb) foods include: Bread, rice, pasta, potatoes, corn, crackers, bagels, muffins, all baked goods.   FRUITS - LIMIT these HIGH sugar/carb fruits = Pineapple, Watermelon, Bananas - OKAY with these MEDIUM sugar/carb fruits = Citrus, Oranges, Grapes - PREFER these LOW sugar/carb fruits = Apples, Berries, Pears, Plums  Protein foods include: Meat, fish, poultry, eggs, dairy foods, and beans such as pinto and kidney beans (beans also provide carbohydrate).   1. Eat at least 3 meals and 1-2 snacks per day. Never go more than 4-5 hours while awake without eating.   2. Limit starchy foods to TWO per meal and ONE per snack. ONE portion of a starchy  food is equal to the following:   - ONE slice of bread (or its equivalent, such as half of a hamburger bun).   - 1/2 cup of a "scoopable" starchy food such as potatoes or rice.   - 1 OUNCE (28 grams) of starchy snacks (crackers or pretzels, look on label).   - 15 grams of carbohydrate as shown on food label.   3. Both lunch and dinner should include a protein food, a carb food, and vegetables.   - Obtain twice as many veg's as protein or carbohydrate foods for both lunch and dinner.   - Try to keep frozen veg's on hand for a quick vegetable serving.     - Fresh  or frozen veg's are best.   4. Breakfast should always include protein.      Please schedule a Follow-up Appointment to: Return for re-schedule next lab/physical for 2 weeks later, due to A1c today.  If you have any other questions or concerns, please feel free to call the office or send a message through [place]. You may also schedule an earlier appointment if necessary.  Additionally, you may be receiving a survey about your experience at our office within a few days to 1 week by e-mail or mail. We value your feedback.  Nobie Putnam, DO [facility], [facility]  Diabetes Mellitus and Nutrition, Adult When you have diabetes, or diabetes mellitus, it is very important to have healthy eating habits because your blood sugar (glucose) levels are greatly affected by what you eat and drink. Eating healthy foods in the right amounts, at about the same times every day, can help you: Manage your blood glucose. Lower your risk of heart disease. Improve your blood pressure. Reach or maintain a healthy weight. What can affect my meal plan? Every person with diabetes is different, and each person has different needs for a meal plan. Your health care provider may recommend that you work with a dietitian to make a meal plan that is best for you.  Your meal plan may vary depending on factors such as: The calories you need. The medicines you take. Your weight. Your blood glucose, blood pressure, and cholesterol levels. Your activity level. Other health conditions you have, such as heart or kidney disease. How do carbohydrates affect me? Carbohydrates, also called carbs, affect your blood glucose level more than any other type of food. Eating carbs raises the amount of glucose in your blood. It is important to know how many carbs you can safely have in each meal. This is different for every person. Your dietitian can help you calculate how many carbs you should have at each meal and  for each snack. How does alcohol affect me? Alcohol can cause a decrease in blood glucose (hypoglycemia), especially if you use insulin or take certain diabetes medicines by mouth. Hypoglycemia can be a life-threatening condition. Symptoms of hypoglycemia, such as sleepiness, dizziness, and confusion, are similar to symptoms of having too much alcohol. Do not drink alcohol if: Your health care provider tells you not to drink. You are pregnant, may be pregnant, or are planning to become pregnant. If you drink alcohol: Limit how much you have to: 0-1 drink a day for women. 0-2 drinks a day for men. Know how much alcohol is in your drink. In the [place] one drink equals one 12 oz bottle of beer (355 mL), one 5 oz glass of wine (148 mL), or one 1 oz glass of hard liquor (44 mL). Keep yourself hydrated with water, diet soda, or unsweetened iced tea. Keep in mind that regular soda, juice, and other mixers may contain a lot of sugar and must be counted as carbs. What are tips for following this plan?  Reading food labels Start by checking the serving size on the Nutrition Facts label of packaged foods and drinks. The number of calories and the amount of carbs, fats, and other nutrients listed on the label are based on one serving of the item. Many items contain more than one serving per package. Check the total grams (g) of carbs in one serving. Check the number of grams of saturated fats and trans fats in one serving. Choose foods that have a low amount or none of these fats. Check the number of milligrams (mg) of salt (sodium) in one serving. Most people should limit total sodium intake to less than 2,300 mg per day. Always check the nutrition information of foods labeled as "low-fat" or "nonfat." These foods may be higher in added sugar or refined carbs and should be avoided. Talk to your dietitian to identify your daily goals for nutrients listed on the label. Shopping Avoid buying canned,  pre-made, or processed foods. These foods tend to be high in fat, sodium, and added sugar. Shop around the outside edge of the grocery store. This is where you will most often find fresh fruits and vegetables, bulk grains, fresh meats, and fresh dairy products. Cooking Use low-heat cooking methods, such as baking, instead of high-heat cooking methods, such as deep frying. Cook using healthy oils, such as olive, canola, or sunflower oil. Avoid cooking with butter, cream, or high-fat meats. Meal planning Eat meals and snacks regularly, preferably at the same times every day. Avoid going long periods of time without eating. Eat foods that are high in fiber, such as fresh fruits, vegetables, beans, and whole grains. Eat 4-6 oz (112-168 g) of lean protein each day, such as lean meat, chicken, fish, eggs, or tofu. One ounce (oz) (28 g) of lean  protein is equal to: 1 oz (28 g) of meat, chicken, or fish. 1 egg.  cup (62 g) of tofu. Eat some foods each day that contain healthy fats, such as avocado, nuts, seeds, and fish. What foods should I eat? Fruits Berries. Apples. Oranges. Peaches. Apricots. Plums. Grapes. Mangoes. Papayas. Pomegranates. Kiwi. Cherries. Vegetables Leafy greens, including lettuce, spinach, kale, chard, collard greens, mustard greens, and cabbage. Beets. Cauliflower. Broccoli. Carrots. Green beans. Tomatoes. Peppers. Onions. Cucumbers. Brussels sprouts. Grains Whole grains, such as whole-wheat or whole-grain bread, crackers, tortillas, cereal, and pasta. Unsweetened oatmeal. Quinoa. Brown or wild rice. Meats and other proteins Seafood. Poultry without skin. Lean cuts of poultry and beef. Tofu. Nuts. Seeds. Dairy Low-fat or fat-free dairy products such as milk, yogurt, and cheese. The items listed above may not be a complete list of foods and beverages you can eat and drink. Contact a dietitian for more information. What foods should I avoid? Fruits Fruits canned with  syrup. Vegetables Canned vegetables. Frozen vegetables with butter or cream sauce. Grains Refined white flour and flour products such as bread, pasta, snack foods, and cereals. Avoid all processed foods. Meats and other proteins Fatty cuts of meat. Poultry with skin. Breaded or fried meats. Processed meat. Avoid saturated fats. Dairy Full-fat yogurt, cheese, or milk. Beverages Sweetened drinks, such as soda or iced tea. The items listed above may not be a complete list of foods and beverages you should avoid. Contact a dietitian for more information. Questions to ask a health care provider Do I need to meet with a certified diabetes care and education specialist? Do I need to meet with a dietitian? What number can I call if I have questions? When are the best times to check my blood glucose? Where to find more information: American Diabetes Association: diabetes.org Academy of Nutrition and Dietetics: eatright.[employer] of Diabetes and Digestive and Kidney Diseases: AmenCredit.is Association of Diabetes Care & Education Specialists: diabeteseducator.org Summary It is important to have healthy eating habits because your blood sugar (glucose) levels are greatly affected by what you eat and drink. It is important to use alcohol carefully. A healthy meal plan will help you manage your blood glucose and lower your risk of heart disease. Your health care provider may recommend that you work with a dietitian to make a meal plan that is best for you. This information is not intended to replace advice given to you by your health care provider. Make sure you discuss any questions you have with your health care provider. Document Revised: 02/11/2020 Document Reviewed: 02/11/2020 Elsevier Patient Education  [place].

## 2022-10-04 ENCOUNTER — Telehealth: Payer: Self-pay

## 2022-10-04 NOTE — Telephone Encounter (Signed)
         Patient  visited on 3/8 [place]    Telephone encounter attempt :  1st  Unable to leave a message       [place], [place] [id number], [place], [place] [address] Phone: [phone] Email: [email or web address]

## 2022-10-05 ENCOUNTER — Telehealth: Payer: Self-pay

## 2022-10-05 NOTE — Telephone Encounter (Signed)
        Patient  visited [place] on 3/8    Telephone encounter attempt :  2nd  A HIPAA compliant voice message was left requesting a return call.  Instructed patient to call back.     [place] [phone] [address], [place], [place] [address] Phone: [phone] Email: [email or web address]

## 2022-10-09 ENCOUNTER — Telehealth: Payer: Self-pay

## 2022-10-09 NOTE — Telephone Encounter (Signed)
   CCM RN Visit Note   10-09-2022 Name: [name] MRN: [id number]      DOB: 20-Nov-1955  Subjective: Nicholas Black is a 67 y.o. year old male who is a primary care patient of @PCP . The patient was referred to the Chronic Care Management team for assistance with care management needs subsequent to provider initiation of CCM services and plan of care.      An unsuccessful telephone outreach was attempted today to contact the patient about Chronic Care Management needs.    Plan:The care management team will reach out to the patient again over the next 30 days.  Noreene Larsson RN, MSN, CCM RN Care Manager  Chronic Care Management Direct Number: [phone]

## 2022-10-16 ENCOUNTER — Telehealth: Payer: Self-pay

## 2022-10-16 NOTE — Progress Notes (Signed)
  Chronic Care Management   Note  10/16/2022 Name: Nicholas Black MRN: [id number] DOB: 19-Oct-1955  Nicholas Black is a 68 y.o. year old male who is a primary care patient of Olin Hauser, DO. I reached out to [name] by phone today in response to a referral sent by Nicholas Black PCP.  Nicholas Black was given information about Chronic Care Management services today including:  CCM service includes personalized support from designated clinical staff supervised by the physician, including individualized plan of care and coordination with other care providers 24/7 contact phone numbers for assistance for urgent and routine care needs. Service will only be billed when office clinical staff spend 20 minutes or more in a month to coordinate care. Only one practitioner may furnish and bill the service in a calendar month. The patient may stop CCM services at amy time (effective at the end of the month) by phone call to the office staff. The patient will be responsible for cost sharing (co-pay) or up to 20% of the service fee (after annual deductible is met)  Nicholas Black  agreedto scheduling an appointment with the CCM RN Case Manager   Follow up plan: Patient agreed to scheduled appointment with RN Case Manager on 10/19/2022 and Pharm d 11/13/2022(date/time).   Noreene Larsson, [place], [place] [address] Direct Dial: [phone] [email or web address]

## 2022-10-18 LAB — MISC LABCORP TEST (SEND OUT): Labcorp test code: 1453

## 2022-10-19 ENCOUNTER — Telehealth: Payer: Medicare HMO

## 2022-10-19 ENCOUNTER — Telehealth: Payer: Self-pay

## 2022-10-19 NOTE — Telephone Encounter (Signed)
   CCM RN Visit Note   10-19-2022 Name: Nicholas Black MRN: [id number]      DOB: 11-09-1955  Subjective: Nicholas Black is a 67 y.o. year old male who is a primary care patient of Dr. Nobie Putnam. The patient was referred to the Chronic Care Management team for assistance with care management needs subsequent to provider initiation of CCM services and plan of care.      Second unsuccessful telephone outreach was attempted today to contact the patient about Chronic Care Management needs.    Plan:The care management team will reach out to the patient again over the next 30  days.  Noreene Larsson RN, MSN, CCM RN Care Manager  Chronic Care Management Direct Number: [phone]

## 2022-10-20 ENCOUNTER — Ambulatory Visit (INDEPENDENT_AMBULATORY_CARE_PROVIDER_SITE_OTHER): Payer: Medicare HMO | Admitting: Family Medicine

## 2022-10-20 ENCOUNTER — Encounter: Payer: Self-pay | Admitting: Family Medicine

## 2022-10-20 VITALS — BP 126/70 | HR 78 | Ht 69.0 in | Wt 196.0 lb

## 2022-10-20 DIAGNOSIS — E1169 Type 2 diabetes mellitus with other specified complication: Secondary | ICD-10-CM

## 2022-10-20 DIAGNOSIS — R5383 Other fatigue: Secondary | ICD-10-CM

## 2022-10-20 MED ORDER — METFORMIN HCL ER 500 MG PO TB24: 1000.0000 mg | ORAL_TABLET | Freq: Every day | ORAL | 1 refills | Status: DC

## 2022-10-20 NOTE — Patient Instructions (Addendum)
Thank you for coming to the office today.  Most likely the reduced energy is from the shift in blood sugar from 300-400s down to 100s-150s it is entirely expected to feel more tired or sluggish as the body re-adjusts.  Recent Labs    10/02/22 1517  HGBA1C 13.4*   Dose increase Metformin XR 500mg  x 2 = 1000mg  daily now, with meal. This is likely the top dose we will use for you.  Ozempic sample today, keep on the dosage track. After 4 doses, go up to 0.5mg  weekly.  One more final dose of 0.25mg  weekly then it is continue at 0.5  Even on the new Ozempic pen, go straight to 0.5mg  weekly  I will reach back out to [facility] and Grayland Ormond so they can call you again.  --------------  [facility]   Address: [address] [place] [address] Phone: [phone]  Website: [email or web address] [address], [place], [place] [address] Phone: [phone] [email or web address]  [facility]  Address: [place], [place], [place] [address] Phone: [phone]   [facility] [address] [place], [place] [address] Phone: [phone]  [facility] Address: [place], [place],  [address]  Phone: [phone]   Please schedule a Follow-up Appointment to: No follow-ups on file.  If you have any other questions or concerns, please feel free to call the office or send a message through [place]. You may also schedule an earlier appointment if necessary.  Additionally, you may be receiving a survey about your experience at our office within a few days to 1 week by e-mail or mail. We value your feedback.  Nobie Putnam, DO [place]

## 2022-10-20 NOTE — Progress Notes (Signed)
Subjective:    Patient ID: Nicholas Black, male    DOB: [birth year], 67 y.o.   MRN: [id number]  Nicholas Black is a 67 y.o. male presenting on 10/20/2022 for Fatigue   HPI  CHRONIC DM, Type 2: New diagnosis early March 2023, A1c >13, he was started on Metformin XR and Ozempic sample. CBGs: Avg 100-150 now with improvement from 300-400 - he felt low energy and fatigue lately Meds: Metformin XR 500mg  daily, and Ozempic 0.25mg  next dose is last then go up to 0.5mg  weekly Reports  good compliance. Tolerating well w/o side-effects Currently on ARB Diet improved lower carb sugar now, less tea, less fried Denies hypoglycemia, polyuria, visual changes, numbness or tingling.      10/20/2022    1:51 PM 10/02/2022    2:58 PM 09/28/2022    9:52 AM  Depression screen PHQ 2/9  Decreased Interest 0 0 0  Down, Depressed, Hopeless 0 0 0  PHQ - 2 Score 0 0 0  Altered sleeping  2 0  Tired, decreased energy  2 0  Change in appetite  0 0  Feeling bad or failure about yourself   0 0  Trouble concentrating  0 0  Moving slowly or fidgety/restless  1 0  Suicidal thoughts  0 0  PHQ-9 Score  5 0  Difficult doing work/chores  Somewhat difficult Not difficult at all    Social History   Tobacco Use   Smoking status: Former    Packs/day: 1.00    Years: 20.00    Additional pack years: 0.00    Total pack years: 20.00    Types: Cigarettes    Quit date: 2016    Years since quitting: 8.2   Smokeless tobacco: Former  Scientific laboratory technician Use: Never used  Substance Use Topics   Alcohol use: Yes    Alcohol/week: 3.0 standard drinks of alcohol    Types: 3 Cans of beer per week   Drug use: Never    Review of Systems Per HPI unless specifically indicated above     Objective:    BP 126/70   Pulse 78   Ht 5\' 9"  (1.753 m)   Wt 196 lb (88.9 kg)   SpO2 99%   BMI 28.94 kg/m   Wt Readings from Last 3 Encounters:  10/20/22 196 lb (88.9 kg)  10/02/22 196 lb (88.9 kg)  09/28/22 196 lb 9.6 oz (89.2  kg)    Physical Exam Vitals and nursing note reviewed.  Constitutional:      General: He is not in acute distress.    Appearance: Normal appearance. He is well-developed. He is not diaphoretic.     Comments: Well-appearing, comfortable, cooperative  HENT:     Head: Normocephalic and atraumatic.  Eyes:     General:        Right eye: No discharge.        Left eye: No discharge.     Conjunctiva/sclera: Conjunctivae normal.  Cardiovascular:     Rate and Rhythm: Normal rate.  Pulmonary:     Effort: Pulmonary effort is normal.  Skin:    General: Skin is warm and dry.     Findings: No erythema or rash.  Neurological:     Mental Status: He is alert and oriented to person, place, and time.  Psychiatric:        Mood and Affect: Mood normal.        Behavior: Behavior normal.  Thought Content: Thought content normal.     Comments: Well groomed, good eye contact, normal speech and thoughts     Recent Labs    10/02/22 1517  HGBA1C 13.4*    Results for orders placed or performed in visit on 10/02/22  POCT glycosylated hemoglobin (Hb A1C)  Result Value Ref Range   Hemoglobin A1C 13.4 (A) 4.0 - 5.6 %      Assessment & Plan:   Problem List Items Addressed This Visit     Type 2 diabetes mellitus with other specified complication ([place]) - Primary    Recent diagnosis w Type 2 Diabetes, A1c now >13 last check early March 2023. Now CBG 100-150s, responding to treatment Seems to have fatigue low energy from shift in blood sugar, re-calibration from 300-400 down to 100s, likely cause of fatigue, should be transient or temporary  Various factors, previously PreDM, has had multiple cortisone injections and lifestyle / diet  Concern with obesity, HTN  Plan:   Dose increase Metformin XR from 500 to 1000mg  daily 1 more Free sample Ozempic injection GLP1 - dose increase to 0.5mg  weekly after this next 0.25mg  dose. He has not been able to check with insurance on coverage  yet  Attempted to refer to CCM RN / Pharmacy for assistance with new dx diabetes - he was not able to connect, he will need new call/schedule again, I will notify Pam Future consider [facility] Encourage improved lifestyle - low carb, low sugar diet, reduce portion size, continue improving regular exercise  Future DM Eye, AVS info given on eye doctors, he admits some blurry vision  Next visit DM Foot and Urine Microalbumin      Relevant Medications   metFORMIN (GLUCOPHAGE-XR) 500 MG 24 hr tablet   Other Visit Diagnoses     Fatigue, unspecified type           Meds ordered this encounter  Medications   metFORMIN (GLUCOPHAGE-XR) 500 MG 24 hr tablet    Sig: Take 2 tablets (1,000 mg total) by mouth daily with breakfast.    Dispense:  180 tablet    Refill:  1      Follow up plan: Return if symptoms worsen or fail to improve, for keep as scheduled in June.   Nobie Putnam, [facility] 10/20/2022, 1:24 PM

## 2022-10-20 NOTE — Assessment & Plan Note (Signed)
Recent diagnosis w Type 2 Diabetes, A1c now >13 last check early March 2023. Now CBG 100-150s, responding to treatment Seems to have fatigue low energy from shift in blood sugar, re-calibration from 300-400 down to 100s, likely cause of fatigue, should be transient or temporary  Various factors, previously PreDM, has had multiple cortisone injections and lifestyle / diet  Concern with obesity, HTN  Plan:   Dose increase Metformin XR from 500 to 1000mg  daily 1 more Free sample Ozempic injection GLP1 - dose increase to 0.5mg  weekly after this next 0.25mg  dose. He has not been able to check with insurance on coverage yet  Attempted to refer to CCM RN / Pharmacy for assistance with new dx diabetes - he was not able to connect, he will need new call/schedule again, I will notify Pam Future consider [facility] Encourage improved lifestyle - low carb, low sugar diet, reduce portion size, continue improving regular exercise  Future DM Eye, AVS info given on eye doctors, he admits some blurry vision  Next visit DM Foot and Urine Microalbumin

## 2022-10-23 ENCOUNTER — Telehealth: Payer: Self-pay

## 2022-10-23 ENCOUNTER — Ambulatory Visit: Payer: Medicare HMO | Admitting: Pharmacist

## 2022-10-23 DIAGNOSIS — E1169 Type 2 diabetes mellitus with other specified complication: Secondary | ICD-10-CM

## 2022-10-23 NOTE — Progress Notes (Addendum)
10/23/2022 Name: Nicholas Black MRN: [id number] DOB: 06-Feb-1956  Chief Complaint  Patient presents with   Medication Assistance    Patient Phone Call    Nicholas Black is a 67 y.o. year old male who presented for a telephone visit.   They were referred to the pharmacist by their PCP for assistance in managing diabetes, hypertension, and medication access.    Subjective:  Care Team: Primary Care Provider: Olin Hauser, DO ; Next Scheduled Visit: 01/19/2023  Medication Access/Adherence  Current Pharmacy:  [place], [address] [place] [address] Phone: [phone] Fax: [phone]   Patient reports affordability concerns with their medications: Yes  Patient reports access/transportation concerns to their pharmacy: No  Patient reports adherence concerns with their medications:  No     Diabetes:  Current medications:  Metformin ER 500 mg daily Ozempic 0.25 mg weekly on Tuesdays. Reports plans to increase to Ozempic 0.5 mg weekly on 4/2  Reports currently taking from sample as provided by office   Medications tried in the past: metformin ER 1000 mg daily - reports was unable to tolerate due to headache  Current glucose readings: ranging 115-150s - Reports last checked yesterday after breakfast, reading: 153   Current medication access support: none   Objective:  Lab Results  Component Value Date   HGBA1C 13.4 (A) 10/02/2022    Lab Results  Component Value Date   CREATININE 1.13 09/29/2022   BUN 29 (H) 09/29/2022   NA 127 (L) 09/29/2022   K 4.7 09/29/2022   CL 93 (L) 09/29/2022   CO2 22 09/29/2022    Lab Results  Component Value Date   CHOL 109 01/05/2021   HDL 84 01/05/2021   LDLCALC <10 01/05/2021   TRIG 87 01/05/2021   CHOLHDL 1.3 01/05/2021    Medications    Reviewed by Rennis Petty, RPH-CPP (Pharmacist) on 10/23/22 at 1650  Med List Status: <None>    Medication Order Taking? Sig Documenting Provider Last Dose Status Informant  amLODipine (NORVASC) 10 MG tablet [id number]  Take 1 tablet (10 mg total) by mouth daily. Karamalegos, Devonne Doughty, DO  Active   BAYER ASPIRIN EC LOW DOSE 81 MG EC tablet [id number]   [provider]  Active   Blood Glucose Monitoring Suppl DEVI [id number]  1 each by Does not apply route in the morning, at noon, and at bedtime. May substitute to any manufacturer covered by patient's insurance. Nance Pear, MD  Active   Cholecalciferol (VITAMIN D HIGH POTENCY) 25 MCG (1000 UT) capsule [id number]  Take 2,000 Units by mouth daily. [provider]  Active   cyclobenzaprine (FLEXERIL) 10 MG tablet [id number]  Take 1 tablet (10 mg total) by mouth 3 (three) times daily as needed for muscle spasms. Karamalegos, Devonne Doughty, DO  Active   finasteride (PROSCAR) 5 MG tablet [id number]  Take 1 tablet (5 mg total) by mouth daily. Karamalegos, Devonne Doughty, DO  Active   Glucose Blood (BLOOD GLUCOSE TEST STRIPS) STRP [id number]  1 each by In Vitro route in the morning, at noon, and at bedtime. May substitute to any manufacturer covered by patient's insurance. Nance Pear, MD  Active   Lancet Device [place] [id number]  1 each by Does not apply route in the morning, at noon, and at bedtime. May substitute to any manufacturer covered by patient's insurance. Nance Pear, MD  Active   Lancets Misc. [place] [id number]  1 each  by Does not apply route in the morning, at noon, and at bedtime. May substitute to any manufacturer covered by patient's insurance. Nance Pear, MD  Active   losartan (COZAAR) 50 MG tablet [id number]  Take 1 tablet (50 mg total) by mouth daily. Karamalegos, Devonne Doughty, DO  Active   metFORMIN (GLUCOPHAGE-XR) 500 MG 24 hr tablet [id number] Yes Take 2 tablets (1,000 mg total) by mouth daily with breakfast.  Patient taking differently: Take 500 mg by mouth daily with breakfast.   Olin Hauser,  DO Taking Active   naproxen (NAPROSYN) 500 MG tablet [id number]  Take 1 tablet (500 mg total) by mouth 2 (two) times daily with a meal. For 2-4 weeks then as needed Olin Hauser, DO  Active   Omega-3 Fatty Acids (FISH OIL) 1000 MG CAPS [id number]  Take by mouth daily. [provider]  Active   polyethylene glycol-electrolytes (NULYTELY) 420 g solution [id number]  Prepare according to package instructions. Starting at 5:00 PM: Drink one 8 oz glass of mixture every 15 minutes until you finish half of the jug. Five hours prior to procedure, drink 8 oz glass of mixture every 15 minutes until it is all gone. Make sure you do not drink anything 4 hours prior to your procedure. Virgel Manifold, MD  Active   pravastatin (PRAVACHOL) 40 MG tablet [id number]  Take 1 tablet (40 mg total) by mouth daily. Olin Hauser, DO  Active   Semaglutide,0.25 or 0.5MG /DOS, (OZEMPIC, 0.25 OR 0.5 MG/DOSE,) 2 MG/3ML SOPN [id number] Yes Inject 0.5 mg into the skin once a week. [provider] Taking Active   [facility] injection [id number]  Inject 0.5 mL into muscle for shingles vaccine. Repeat dose in 2-6 months. Olin Hauser, DO  Active   Sodium Sulfate-Mag Sulfate-KCl (SUTAB) [phone] MG TABS [id number]  At 5 PM take 12 tablets using the 8 oz cup provided in the kit drinking 5 cups of water and 5 hours before your procedure repeat the same process. Virgel Manifold, MD  Active               Assessment/Plan:   Unable to complete comprehensive medication review today; plan to complete at upcoming appointment  Diabetes: - Currently uncontrolled - Recommend to check glucose and have record of results to review during upcoming appointments - Counsel patient on strategies to improve comfort with blood sugar monitoring  - Meets financial criteria for Ozempic patient assistance program through [employer]. Will collaborate with provider, [facility] CPh[facility], and patient to  pursue assistance.     Follow Up Plan: Clinical Pharmacist will follow up with patient by telephone as previously schedule on 11/13/2022  Wallace Cullens, PharmD, [place], [place] [phone]

## 2022-10-23 NOTE — Patient Instructions (Signed)
Goals Addressed             This Visit's Progress    Pharmacy Goals       Our goal A1c is less than 7%. This corresponds with fasting sugars less than 130 and 2 hour after meal sugars less than 180. Please keep a log of your results when checking your blood sugar   Our goal bad cholesterol, or LDL, is less than 70 . This is why it is important to continue taking your pravastatin.  Check your blood pressure twice weekly, and any time you have concerning symptoms like headache, chest pain, dizziness, shortness of breath, or vision changes.   Our goal is less than 130/80.  To appropriately check your blood pressure, make sure you do the following:  1) Avoid caffeine, exercise, or tobacco products for 30 minutes before checking. Empty your bladder. 2) Sit with your back supported in a flat-backed chair. Rest your arm on something flat (arm of the chair, table, etc). 3) Sit still with your feet flat on the floor, resting, for at least 5 minutes.  4) Check your blood pressure. Take 1-2 readings.  5) Write down these readings and bring with you to any provider appointments.  Bring your home blood pressure machine with you to a provider's office for accuracy comparison at least once a year.   Make sure you take your blood pressure medications before you come to any office visit, even if you were asked to fast for labs.  Leyland Kenna Dwanna Goshert, PharmD, BCACP, CPP Clinical Pharmacist [facility] [place] [phone]         

## 2022-10-23 NOTE — Telephone Encounter (Signed)
   CCM RN Visit Note   10-23-2022 Name: KAELEN MOAK MRN: [id number]      DOB: 1955/11/07  Subjective: KIMM HODGMAN is a 67 y.o. year old male who is a primary care patient of Dr. Parks Ranger. The patient was referred to the Chronic Care Management team for assistance with care management needs subsequent to provider initiation of CCM services and plan of care.      An unsuccessful telephone outreach was attempted today to contact the patient about Chronic Care Management needs.    Plan:The care management team will reach out to the patient again over the next 30 days.  Noreene Larsson RN, MSN, CCM RN Care Manager  Chronic Care Management Direct Number: [phone]

## 2022-10-24 ENCOUNTER — Ambulatory Visit (INDEPENDENT_AMBULATORY_CARE_PROVIDER_SITE_OTHER): Payer: Medicare HMO

## 2022-10-24 DIAGNOSIS — I10 Essential (primary) hypertension: Secondary | ICD-10-CM

## 2022-10-24 DIAGNOSIS — E1169 Type 2 diabetes mellitus with other specified complication: Secondary | ICD-10-CM

## 2022-10-24 NOTE — Plan of Care (Signed)
Chronic Care Management Provider Comprehensive Care Plan    10/24/2022 Name: Nicholas Black MRN: [id number] DOB: [birth year]  Referral to Chronic Care Management (CCM) services was placed by Provider:  Dr. Nobie Putnam on Date: 10-02-2022 .  Chronic Condition 1: DM Provider Assessment and Plan  New diagnosis w Type 2 Diabetes, A1c now >13 Various factors, previously PreDM, has had multiple cortisone injections and lifestyle / diet  Concern with obesity, HTN   Plan:    Start Metformin XR 500mg  daily with meal in morning, after 1-2 weeks you can increase to take TWO pills in the morning with Metformin XR 500mg  x 2 = 1000mg  daily   Free sample Ozempic injection GLP1 = 6 week sample - dial the dose, 0.25mg  weekly x 4 weeks - Increase to 0.5mg  weekly for x 2 weeks = finished.   We can order Ozempic pen for you, and continue on 0.5mg  weekly Notify office 4-5 weeks into sample Referral to CCM RN / Pharmacy for assistance with new dx diabetes Future consider [facility] Encourage improved lifestyle - low carb, low sugar diet, reduce portion size, continue improving regular exercise         Relevant Orders    POCT glycosylated hemoglobin (Hb A1C) (Completed)    AMB Referral to Chronic Care Management Services      Expected Outcome/Goals Addressed This Visit (Provider CCM goals/Provider Assessment and plan   Symptom Management Condition 1: Take all medications as prescribed Attend all scheduled provider appointments Call provider office for new concerns or questions  call the Suicide and Crisis Lifeline: 988 call the [place] National Suicide Prevention Lifeline: 838-095-3407 or TTY: 626-855-4656 [place] 860-868-3687) to talk to a trained counselor call 1-800-273-TALK (toll free, 24 hour hotline) if experiencing a Mental Health or [place]  check feet daily for cuts, sores or redness trim toenails straight across manage portion size wash and  dry feet carefully every day wear comfortable, cotton socks wear comfortable, well-fitting shoes  Chronic Condition 2: HTN Provider Assessment and Plan   Continue current BP regimen - Amlodipine 10mg  daily, Losartan 50mg  daily 2. Encourage improved lifestyle - low sodium diet, regular exercise 3. Continue monitor BP outside office, bring readings to next visit, if persistently >140/90 or new symptoms notify office sooner         Relevant Orders    AMB Referral to Chronic Care Management Services     Expected Outcome/Goals Addressed This Visit (Provider CCM goals/Provider Assessment and plan   Symptom Management Condition 2: Take all medications as prescribed Attend all scheduled provider appointments Call provider office for new concerns or questions  call the Suicide and Crisis Lifeline: 988 call the [place] National Suicide Prevention Lifeline: 530-253-2795 or TTY: 248-759-9622 TTY (450) 602-8628) to talk to a trained counselor call 1-800-273-TALK (toll free, 24 hour hotline) if experiencing a Mental Health or [place]  check blood pressure weekly learn about high blood pressure take blood pressure log to all doctor appointments call doctor for signs and symptoms of high blood pressure keep all doctor appointments take medications for blood pressure exactly as prescribed report new symptoms to your doctor  Problem List Patient Active Problem List   Diagnosis Date Noted   Generalized anxiety disorder with panic attacks 01/17/2021   Hyperlipidemia associated with type 2 diabetes mellitus 07/15/2020   Encounter for screening colonoscopy    Polyp of colon    Type 2 diabetes mellitus with other specified complication [id number]  Right testicular pain 11/07/2019   Obesity (BMI 30.0-34.9) 03/13/2018   Spondylosis of lumbar region without myelopathy or radiculopathy 03/13/2018   Essential hypertension 03/12/2018   BPH with obstruction/lower urinary tract symptoms  03/12/2018    Medication Management  Current Outpatient Medications:    amLODipine (NORVASC) 10 MG tablet, Take 1 tablet (10 mg total) by mouth daily., Disp: 90 tablet, Rfl: 3   BAYER ASPIRIN EC LOW DOSE 81 MG EC tablet, , Disp: , Rfl:    Blood Glucose Monitoring Suppl DEVI, 1 each by Does not apply route in the morning, at noon, and at bedtime. May substitute to any manufacturer covered by patient's insurance., Disp: 1 each, Rfl: 0   Cholecalciferol (VITAMIN D HIGH POTENCY) 25 MCG (1000 UT) capsule, Take 2,000 Units by mouth daily., Disp: , Rfl:    cyclobenzaprine (FLEXERIL) 10 MG tablet, Take 1 tablet (10 mg total) by mouth 3 (three) times daily as needed for muscle spasms., Disp: 30 tablet, Rfl: 2   finasteride (PROSCAR) 5 MG tablet, Take 1 tablet (5 mg total) by mouth daily., Disp: 90 tablet, Rfl: 3   Glucose Blood (BLOOD GLUCOSE TEST STRIPS) STRP, 1 each by In Vitro route in the morning, at noon, and at bedtime. May substitute to any manufacturer covered by patient's insurance., Disp: 100 strip, Rfl: 0   Lancet Device MISC, 1 each by Does not apply route in the morning, at noon, and at bedtime. May substitute to any manufacturer covered by patient's insurance., Disp: 1 each, Rfl: 0   Lancets Misc. MISC, 1 each by Does not apply route in the morning, at noon, and at bedtime. May substitute to any manufacturer covered by patient's insurance., Disp: 100 each, Rfl: 0   losartan (COZAAR) 50 MG tablet, Take 1 tablet (50 mg total) by mouth daily., Disp: 90 tablet, Rfl: 3   metFORMIN (GLUCOPHAGE-XR) 500 MG 24 hr tablet, Take 2 tablets (1,000 mg total) by mouth daily with breakfast. (Patient taking differently: Take 500 mg by mouth daily with breakfast.), Disp: 180 tablet, Rfl: 1   naproxen (NAPROSYN) 500 MG tablet, Take 1 tablet (500 mg total) by mouth 2 (two) times daily with a meal. For 2-4 weeks then as needed, Disp: 60 tablet, Rfl: 2   Omega-3 Fatty Acids (FISH OIL) 1000 MG CAPS, Take by mouth  daily., Disp: , Rfl:    polyethylene glycol-electrolytes (NULYTELY) 420 g solution, Prepare according to package instructions. Starting at 5:00 PM: Drink one 8 oz glass of mixture every 15 minutes until you finish half of the jug. Five hours prior to procedure, drink 8 oz glass of mixture every 15 minutes until it is all gone. Make sure you do not drink anything 4 hours prior to your procedure., Disp: 4000 mL, Rfl: 0   pravastatin (PRAVACHOL) 40 MG tablet, Take 1 tablet (40 mg total) by mouth daily., Disp: 90 tablet, Rfl: 3   Semaglutide,0.25 or 0.5MG /DOS, (OZEMPIC, 0.25 OR 0.5 MG/DOSE,) 2 MG/3ML SOPN, Inject 0.5 mg into the skin once a week., Disp: , Rfl:    SHINGRIX injection, Inject 0.5 mL into muscle for shingles vaccine. Repeat dose in 2-6 months., Disp: 0.5 mL, Rfl: 1   Sodium Sulfate-Mag Sulfate-KCl (SUTAB) [id number] MG TABS, At 5 PM take 12 tablets using the 8 oz cup provided in the kit drinking 5 cups of water and 5 hours before your procedure repeat the same process., Disp: 24 tablet, Rfl: 0  Cognitive Assessment Identity Confirmed: : Name; DOB Cognitive Status: Normal  Functional Assessment Hearing Difficulty or Deaf: no Wear Glasses or Blind: yes Vision Management: wears glasses Concentrating, Remembering or Making Decisions Difficulty (CP): no Difficulty Communicating: no Difficulty Eating/Swallowing: no Walking or Climbing Stairs Difficulty: no Dressing/Bathing Difficulty: no Doing Errands Independently Difficulty (such as shopping) (CP): no   Caregiver Assessment  Primary Source of Support/Comfort: spouse Name of Support/Comfort Primary Source: wife Eyuel [place] People in Home: spouse Family Caregiver if Needed: none Primary Roles/Responsibilities: wage earner, full-time   Planned Interventions  Provided education to patient about basic DM disease process; Reviewed medications with patient and discussed importance of medication adherence;        Reviewed  prescribed diet with patient heart healthy/ADA diet, will attach information to MyChart for the patient for review and education. Discussed foods high in carbohydrates  and to eat in moderation foods like pasta, rice, potatoes. The patient states he has cut out french fries and other things that were bad for him. Wants to learn more about foods that are good for him. Education provided on fruits and vegetables ; Counseled on importance of regular laboratory monitoring as prescribed;        Discussed plans with patient for ongoing care management follow up and provided patient with direct contact information for care management team;      Provided patient with written educational materials related to hypo and hyperglycemia and importance of correct treatment;       Reviewed scheduled/upcoming provider appointments including: 01-19-2023 at 3 pm with pcp;         Advised patient, providing education and rationale, to check cbg twice daily and when you have symptoms of low or high blood sugar and record        call provider for findings outside established parameters;       Referral made to pharmacy team for assistance with working with pharm D for assistance with getting Ozempic. The patient is taking Metformin but  had to lower the dose from 1000 mg to 500 mg. MD aware. Higher dose was causing the patient to have a headache;       Review of patient status, including review of consultants reports, relevant laboratory and other test results, and medications completed;       Advised patient to discuss changes in his DM, questions or concerns with provider;      Screening for signs and symptoms of depression related to chronic disease state;        Assessed social determinant of health barriers;        Review of online resources to help with DM management. Look at the website American Diabetes Association at https://www.diabetes.org . The site has free resources and meal planning information Evaluation of  current treatment plan related to hypertension self management and patient's adherence to plan as established by provider;   Provided education to patient re: stroke prevention, s/s of heart attack and stroke; Reviewed prescribed diet heart healthy/ADA diet  Reviewed medications with patient and discussed importance of compliance;  Discussed plans with patient for ongoing care management follow up and provided patient with direct contact information for care management team; Advised patient, providing education and rationale, to monitor blood pressure daily and record, calling PCP for findings outside established parameters;  Reviewed scheduled/upcoming provider appointments including: 01-19-2023 at 0900 am Advised patient to discuss changes in HTN or heart health with provider; Provided education on prescribed diet heart healthy/ADA diet. Will attach information in my Chart for the patient;  Discussed complications  of poorly controlled blood pressure such as heart disease, stroke, circulatory complications, vision complications, kidney impairment, sexual dysfunction;  Screening for signs and symptoms of depression related to chronic disease state;  Assessed social determinant of health barriers;      Interaction and coordination with outside resources, practitioners, and providers See CCM Referral  Care Plan: Available in MyChart

## 2022-10-24 NOTE — Patient Instructions (Addendum)
Please call the care guide team at [phone] if you need to cancel or reschedule your appointment.   If you are experiencing a Mental Health or [place] or need someone to talk to, please call the Suicide and Crisis Lifeline: 988 call the [place] National Suicide Prevention Lifeline: 434-191-5142 or TTY: 458 640 7261 TTY 2624710823) to talk to a trained counselor call 1-800-273-TALK (toll free, 24 hour hotline)   Following is a copy of the CCM Program Consent:  CCM service includes personalized support from designated clinical staff supervised by the physician, including individualized plan of care and coordination with other care providers 24/7 contact phone numbers for assistance for urgent and routine care needs. Service will only be billed when office clinical staff spend 20 minutes or more in a month to coordinate care. Only one practitioner may furnish and bill the service in a calendar month. The patient may stop CCM services at amy time (effective at the end of the month) by phone call to the office staff. The patient will be responsible for cost sharing (co-pay) or up to 20% of the service fee (after annual deductible is met)  Following is a copy of your full provider care plan:   Goals Addressed             This Visit's Progress    CCM Expected Outcome:  Monitor, Self-Manage and Reduce Symptoms of Diabetes       Current Barriers:  Knowledge Deficits related to importance of following the plan of care for effective management of DM Care Coordination needs related to cost constraints for medications, is working with the pharm D for assistance with getting Ozempic in a patient with DM Chronic Disease Management support and education needs related to effective management of DM Lab Results  Component Value Date   HGBA1C 13.4 (A) 10/02/2022     Planned Interventions: Provided education to patient about basic DM disease process; Reviewed medications with  patient and discussed importance of medication adherence;        Reviewed prescribed diet with patient heart healthy/ADA diet, will attach information to MyChart for the patient for review and education. Discussed foods high in carbohydrates  and to eat in moderation foods like pasta, rice, potatoes. The patient states he has cut out french fries and other things that were bad for him. Wants to learn more about foods that are good for him. Education provided on fruits and vegetables ; Counseled on importance of regular laboratory monitoring as prescribed;        Discussed plans with patient for ongoing care management follow up and provided patient with direct contact information for care management team;      Provided patient with written educational materials related to hypo and hyperglycemia and importance of correct treatment;       Reviewed scheduled/upcoming provider appointments including: 01-19-2023 at 3 pm with pcp;         Advised patient, providing education and rationale, to check cbg twice daily and when you have symptoms of low or high blood sugar and record        call provider for findings outside established parameters;       Referral made to pharmacy team for assistance with working with pharm D for assistance with getting Ozempic. The patient is taking Metformin but  had to lower the dose from 1000 mg to 500 mg. MD aware. Higher dose was causing the patient to have a headache;       Review  of patient status, including review of consultants reports, relevant laboratory and other test results, and medications completed;       Advised patient to discuss changes in his DM, questions or concerns with provider;      Screening for signs and symptoms of depression related to chronic disease state;        Assessed social determinant of health barriers;        Review of online resources to help with DM management. Look at the website American Diabetes Association at https://www.diabetes.org .  The site has free resources and meal planning information  Symptom Management: Take medications as prescribed   Attend all scheduled provider appointments Call provider office for new concerns or questions  call the Suicide and Crisis Lifeline: 988 call the [place] National Suicide Prevention Lifeline: (859) 199-8161 or TTY: (843)111-9039 TTY (478)012-0231) to talk to a trained counselor call 1-800-273-TALK (toll free, 24 hour hotline) if experiencing a Mental Health or [place]  check feet daily for cuts, sores or redness trim toenails straight across manage portion size wash and dry feet carefully every day wear comfortable, cotton socks wear comfortable, well-fitting shoes  Follow Up Plan: Telephone follow up appointment with care management team member scheduled for: 11-28-2022 at 0900 am       CCM Expected Outcome:  Monitor, Self-Manage, and Reduce Symptoms of Hypertension       Current Barriers:  Chronic Disease Management support and education needs related to effective management of HTN  Planned Interventions: Evaluation of current treatment plan related to hypertension self management and patient's adherence to plan as established by provider;   Provided education to patient re: stroke prevention, s/s of heart attack and stroke; Reviewed prescribed diet heart healthy/ADA diet  Reviewed medications with patient and discussed importance of compliance;  Discussed plans with patient for ongoing care management follow up and provided patient with direct contact information for care management team; Advised patient, providing education and rationale, to monitor blood pressure daily and record, calling PCP for findings outside established parameters;  Reviewed scheduled/upcoming provider appointments including: 01-19-2023 at 0900 am Advised patient to discuss changes in HTN or heart health with provider; Provided education on prescribed diet heart healthy/ADA diet. Will  attach information in my Chart for the patient;  Discussed complications of poorly controlled blood pressure such as heart disease, stroke, circulatory complications, vision complications, kidney impairment, sexual dysfunction;  Screening for signs and symptoms of depression related to chronic disease state;  Assessed social determinant of health barriers;   Symptom Management: Take medications as prescribed   Attend all scheduled provider appointments Call provider office for new concerns or questions  call the Suicide and Crisis Lifeline: 988 call the [place] National Suicide Prevention Lifeline: (905)054-3568 or TTY: 850-234-7006 TTY 901-339-5903) to talk to a trained counselor call 1-800-273-TALK (toll free, 24 hour hotline) if experiencing a Mental Health or [place]  check blood pressure weekly learn about high blood pressure call doctor for signs and symptoms of high blood pressure develop an action plan for high blood pressure keep all doctor appointments take medications for blood pressure exactly as prescribed report new symptoms to your doctor  Follow Up Plan: Telephone follow up appointment with care management team member scheduled for: 11-28-2022 at 0900 am          Patient verbalizes understanding of instructions and care plan provided today and agrees to view in [place]. Active MyChart status and patient understanding of how to access instructions and  care plan via MyChart confirmed with patient.     Telephone follow up appointment with care management team member scheduled for: 11-28-2022 at 0900 am  Hemoglobin A1C Test Why am I having this test? You may have the hemoglobin A1C test (A1C test) done to: Check your risk for developing diabetes. Diagnose diabetes. Check the long-term control of blood sugar (glucose) in people who have diabetes and help make treatment decisions. This test may be done with other blood glucose tests, such as fasting blood  glucose and oral glucose tolerance tests. What is being tested? Hemoglobin is a type of protein in the blood that carries oxygen. Glucose attaches to hemoglobin to form glycated hemoglobin. This test checks the amount of glycated hemoglobin in your blood. This is a good indicator of the average amount of glucose in your blood during the past 2-3 months. What kind of sample is taken?  A blood sample is required for this test. It is usually collected by inserting a needle into a blood vessel. Tell a health care provider about: All medicines you are taking, including vitamins, herbs, eye drops, creams, and over-the-counter medicines. Any blood disorders you have. Any surgeries you have had. Any medical conditions you have. Whether you are pregnant or may be pregnant. How are the results reported? Your results will be reported as a percentage indicating how much of your hemoglobin has glucose attached to it. Your health care provider will compare your results to normal ranges established after testing a large group of people (reference ranges). Reference ranges may vary among labs and hospitals. For this test, common reference ranges are: Adult or child without diabetes: 4-5.6%. Adult or child with prediabetes: 5.7%-6.4%. Adult or child with diabetes: 6.5% or higher. What do the results mean? If you do not have diabetes: A result within the reference range means that you are not at high risk for diabetes. A result of 5.7-6.4% means that you have a high risk of developing diabetes, and you have prediabetes. Having prediabetes puts you at risk for developing type 2 diabetes. You may have more tests, including a repeat A1C test. Results of 6.5% or higher on two separate A1C tests mean that you have diabetes. You may have more tests to confirm the diagnosis. If you have diabetes: A result of 7% usually means that your diabetes is under control. A result of less than 7% also means that diabetes is  under control. This result may be an appropriate goal for those for whom there is a low risk of low blood sugar (hypoglycemia). A result of less than 8% may also mean that diabetes is under control. This result may be an appropriate goal for those who do not benefit from more blood glucose control or who are at high risk for hypoglycemia. Abnormally low A1C values may be caused by: Pregnancy. Severe blood loss. Receiving donated blood (transfusions). Low red blood cell count (anemia). Long-term kidney failure. Some unusual forms (variants) of hemoglobin. Talk with your health care provider about what your results mean. Questions to ask your health care provider Ask your health care provider, or the department that is doing the test: When will my results be ready? How will I get my results? What are my treatment options? What other tests do I need? What are my next steps? Summary The A1C test is done to check your risk for developing diabetes, diagnose diabetes, and check the long-term control of blood sugar (glucose) in people who have diabetes and help  make treatment decisions. Hemoglobin is a type of protein in the blood that carries oxygen. Glucose attaches to hemoglobin to form glycated hemoglobin. This test checks the amount of glycated hemoglobin in your blood. Talk with your health care provider about what your results mean. This information is not intended to replace advice given to you by your health care provider. Make sure you discuss any questions you have with your health care provider. Document Revised: 02/14/2022 Document Reviewed: 08/24/2021 Elsevier Patient Education  [place]. Diabetes Mellitus and Nutrition, Adult When you have diabetes, or diabetes mellitus, it is very important to have healthy eating habits because your blood sugar (glucose) levels are greatly affected by what you eat and drink. Eating healthy foods in the right amounts, at about the same times  every day, can help you: Manage your blood glucose. Lower your risk of heart disease. Improve your blood pressure. Reach or maintain a healthy weight. What can affect my meal plan? Every person with diabetes is different, and each person has different needs for a meal plan. Your health care provider may recommend that you work with a dietitian to make a meal plan that is best for you. Your meal plan may vary depending on factors such as: The calories you need. The medicines you take. Your weight. Your blood glucose, blood pressure, and cholesterol levels. Your activity level. Other health conditions you have, such as heart or kidney disease. How do carbohydrates affect me? Carbohydrates, also called carbs, affect your blood glucose level more than any other type of food. Eating carbs raises the amount of glucose in your blood. It is important to know how many carbs you can safely have in each meal. This is different for every person. Your dietitian can help you calculate how many carbs you should have at each meal and for each snack. How does alcohol affect me? Alcohol can cause a decrease in blood glucose (hypoglycemia), especially if you use insulin or take certain diabetes medicines by mouth. Hypoglycemia can be a life-threatening condition. Symptoms of hypoglycemia, such as sleepiness, dizziness, and confusion, are similar to symptoms of having too much alcohol. Do not drink alcohol if: Your health care provider tells you not to drink. You are pregnant, may be pregnant, or are planning to become pregnant. If you drink alcohol: Limit how much you have to: 0-1 drink a day for women. 0-2 drinks a day for men. Know how much alcohol is in your drink. In the [place] one drink equals one 12 oz bottle of beer (355 mL), one 5 oz glass of wine (148 mL), or one 1 oz glass of hard liquor (44 mL). Keep yourself hydrated with water, diet soda, or unsweetened iced tea. Keep in mind that regular soda,  juice, and other mixers may contain a lot of sugar and must be counted as carbs. What are tips for following this plan?  Reading food labels Start by checking the serving size on the Nutrition Facts label of packaged foods and drinks. The number of calories and the amount of carbs, fats, and other nutrients listed on the label are based on one serving of the item. Many items contain more than one serving per package. Check the total grams (g) of carbs in one serving. Check the number of grams of saturated fats and trans fats in one serving. Choose foods that have a low amount or none of these fats. Check the number of milligrams (mg) of salt (sodium) in one serving. Most  people should limit total sodium intake to less than 2,300 mg per day. Always check the nutrition information of foods labeled as "low-fat" or "nonfat." These foods may be higher in added sugar or refined carbs and should be avoided. Talk to your dietitian to identify your daily goals for nutrients listed on the label. Shopping Avoid buying canned, pre-made, or processed foods. These foods tend to be high in fat, sodium, and added sugar. Shop around the outside edge of the grocery store. This is where you will most often find fresh fruits and vegetables, bulk grains, fresh meats, and fresh dairy products. Cooking Use low-heat cooking methods, such as baking, instead of high-heat cooking methods, such as deep frying. Cook using healthy oils, such as olive, canola, or sunflower oil. Avoid cooking with butter, cream, or high-fat meats. Meal planning Eat meals and snacks regularly, preferably at the same times every day. Avoid going long periods of time without eating. Eat foods that are high in fiber, such as fresh fruits, vegetables, beans, and whole grains. Eat 4-6 oz (112-168 g) of lean protein each day, such as lean meat, chicken, fish, eggs, or tofu. One ounce (oz) (28 g) of lean protein is equal to: 1 oz (28 g) of meat,  chicken, or fish. 1 egg.  cup (62 g) of tofu. Eat some foods each day that contain healthy fats, such as avocado, nuts, seeds, and fish. What foods should I eat? Fruits Berries. Apples. Oranges. Peaches. Apricots. Plums. Grapes. Mangoes. Papayas. Pomegranates. Kiwi. Cherries. Vegetables Leafy greens, including lettuce, spinach, kale, chard, collard greens, mustard greens, and cabbage. Beets. Cauliflower. Broccoli. Carrots. Green beans. Tomatoes. Peppers. Onions. Cucumbers. Brussels sprouts. Grains Whole grains, such as whole-wheat or whole-grain bread, crackers, tortillas, cereal, and pasta. Unsweetened oatmeal. Quinoa. Brown or wild rice. Meats and other proteins Seafood. Poultry without skin. Lean cuts of poultry and beef. Tofu. Nuts. Seeds. Dairy Low-fat or fat-free dairy products such as milk, yogurt, and cheese. The items listed above may not be a complete list of foods and beverages you can eat and drink. Contact a dietitian for more information. What foods should I avoid? Fruits Fruits canned with syrup. Vegetables Canned vegetables. Frozen vegetables with butter or cream sauce. Grains Refined white flour and flour products such as bread, pasta, snack foods, and cereals. Avoid all processed foods. Meats and other proteins Fatty cuts of meat. Poultry with skin. Breaded or fried meats. Processed meat. Avoid saturated fats. Dairy Full-fat yogurt, cheese, or milk. Beverages Sweetened drinks, such as soda or iced tea. The items listed above may not be a complete list of foods and beverages you should avoid. Contact a dietitian for more information. Questions to ask a health care provider Do I need to meet with a certified diabetes care and education specialist? Do I need to meet with a dietitian? What number can I call if I have questions? When are the best times to check my blood glucose? Where to find more information: American Diabetes Association: diabetes.org Academy of  Nutrition and Dietetics: eatright.[employer] of Diabetes and Digestive and Kidney Diseases: [employer] Association of Diabetes Care & Education Specialists: diabeteseducator.org Summary It is important to have healthy eating habits because your blood sugar (glucose) levels are greatly affected by what you eat and drink. It is important to use alcohol carefully. A healthy meal plan will help you manage your blood glucose and lower your risk of heart disease. Your health care provider may recommend that you work with a dietitian  to make a meal plan that is best for you. This information is not intended to replace advice given to you by your health care provider. Make sure you discuss any questions you have with your health care provider. Document Revised: 02/11/2020 Document Reviewed: 02/11/2020 Elsevier Patient Education  [place] Many factors influence your heart health, including eating and exercise habits. Heart health is also called coronary health. Coronary risk increases with abnormal blood fat (lipid) levels. A heart-healthy eating plan includes limiting unhealthy fats, increasing healthy fats, limiting salt (sodium) intake, and making other diet and lifestyle changes. What is my plan? Your health care provider may recommend that: You limit your fat intake to _________% or less of your total calories each day. You limit your saturated fat intake to _________% or less of your total calories each day. You limit the amount of cholesterol in your diet to less than _________ mg per day. You limit the amount of sodium in your diet to less than _________ mg per day. What are tips for following this plan? Cooking Cook foods using methods other than frying. Baking, boiling, grilling, and broiling are all good options. Other ways to reduce fat include: Removing the skin from poultry. Removing all visible fats from meats. Steaming vegetables in  water or broth. Meal planning  At meals, imagine dividing your plate into fourths: Fill one-half of your plate with vegetables and green salads. Fill one-fourth of your plate with whole grains. Fill one-fourth of your plate with lean protein foods. Eat 2-4 cups of vegetables per day. One cup of vegetables equals 1 cup (91 g) broccoli or cauliflower florets, 2 medium carrots, 1 large bell pepper, 1 large sweet potato, 1 large tomato, 1 medium white potato, 2 cups (150 g) raw leafy greens. Eat 1-2 cups of fruit per day. One cup of fruit equals 1 small apple, 1 large banana, 1 cup (237 g) mixed fruit, 1 large orange,  cup (82 g) dried fruit, 1 cup (240 mL) 100% fruit juice. Eat more foods that contain soluble fiber. Examples include apples, broccoli, carrots, beans, peas, and barley. Aim to get 25-30 g of fiber per day. Increase your consumption of legumes, nuts, and seeds to 4-5 servings per week. One serving of dried beans or legumes equals  cup (90 g) cooked, 1 serving of nuts is  oz (12 almonds, 24 pistachios, or 7 walnut halves), and 1 serving of seeds equals  oz (8 g). Fats Choose healthy fats more often. Choose monounsaturated and polyunsaturated fats, such as olive and canola oils, avocado oil, flaxseeds, walnuts, almonds, and seeds. Eat more omega-3 fats. Choose salmon, mackerel, sardines, tuna, flaxseed oil, and ground flaxseeds. Aim to eat fish at least 2 times each week. Check food labels carefully to identify foods with trans fats or high amounts of saturated fat. Limit saturated fats. These are found in animal products, such as meats, butter, and cream. Plant sources of saturated fats include palm oil, palm kernel oil, and coconut oil. Avoid foods with partially hydrogenated oils in them. These contain trans fats. Examples are stick margarine, some tub margarines, cookies, crackers, and other baked goods. Avoid fried foods. General information Eat more home-cooked food and less  restaurant, buffet, and fast food. Limit or avoid alcohol. Limit foods that are high in added sugar and simple starches such as foods made using white refined flour (white breads, pastries, sweets). Lose weight if you are overweight. Losing just 5-10% of your body weight  can help your overall health and prevent diseases such as diabetes and heart disease. Monitor your sodium intake, especially if you have high blood pressure. Talk with your health care provider about your sodium intake. Try to incorporate more vegetarian meals weekly. What foods should I eat? Fruits All fresh, canned (in natural juice), or frozen fruits. Vegetables Fresh or frozen vegetables (raw, steamed, roasted, or grilled). Green salads. Grains Most grains. Choose whole wheat and whole grains most of the time. Rice and pasta, including brown rice and pastas made with whole wheat. Meats and other proteins Lean, well-trimmed beef, veal, pork, and lamb. Chicken and [place] without skin. All fish and shellfish. Wild duck, rabbit, pheasant, and venison. Egg whites or low-cholesterol egg substitutes. Dried beans, peas, lentils, and tofu. Seeds and most nuts. Dairy Low-fat or nonfat cheeses, including ricotta and mozzarella. Skim or 1% milk (liquid, powdered, or evaporated). Buttermilk made with low-fat milk. Nonfat or low-fat yogurt. Fats and oils Non-hydrogenated (trans-free) margarines. Vegetable oils, including soybean, sesame, sunflower, olive, avocado, peanut, safflower, corn, canola, and cottonseed. Salad dressings or mayonnaise made with a vegetable oil. Beverages Water (mineral or sparkling). Coffee and tea. Unsweetened ice tea. Diet beverages. Sweets and desserts Sherbet, gelatin, and fruit ice. Small amounts of dark chocolate. Limit all sweets and desserts. Seasonings and condiments All seasonings and condiments. The items listed above may not be a complete list of foods and beverages you can eat. Contact a dietitian  for more options. What foods should I avoid? Fruits Canned fruit in heavy syrup. Fruit in cream or butter sauce. Fried fruit. Limit coconut. Vegetables Vegetables cooked in cheese, cream, or butter sauce. Fried vegetables. Grains Breads made with saturated or trans fats, oils, or whole milk. Croissants. Sweet rolls. Donuts. High-fat crackers, such as cheese crackers and chips. Meats and other proteins Fatty meats, such as hot dogs, ribs, sausage, bacon, rib-eye roast or steak. High-fat deli meats, such as salami and bologna. Caviar. Domestic duck and goose. Organ meats, such as liver. Dairy Cream, sour cream, cream cheese, and creamed cottage cheese. Whole-milk cheeses. Whole or 2% milk (liquid, evaporated, or condensed). Whole buttermilk. Cream sauce or high-fat cheese sauce. Whole-milk yogurt. Fats and oils Meat fat, or shortening. Cocoa butter, hydrogenated oils, palm oil, coconut oil, palm kernel oil. Solid fats and shortenings, including bacon fat, salt pork, lard, and butter. Nondairy cream substitutes. Salad dressings with cheese or sour cream. Beverages Regular sodas and any drinks with added sugar. Sweets and desserts Frosting. Pudding. Cookies. Cakes. Pies. Milk chocolate or white chocolate. Buttered syrups. Full-fat ice cream or ice cream drinks. The items listed above may not be a complete list of foods and beverages to avoid. Contact a dietitian for more information. Summary Heart-healthy meal planning includes limiting unhealthy fats, increasing healthy fats, limiting salt (sodium) intake and making other diet and lifestyle changes. Lose weight if you are overweight. Losing just 5-10% of your body weight can help your overall health and prevent diseases such as diabetes and heart disease. Focus on eating a balance of foods, including fruits and vegetables, low-fat or nonfat dairy, lean protein, nuts and legumes, whole grains, and heart-healthy oils and fats. This information is  not intended to replace advice given to you by your health care provider. Make sure you discuss any questions you have with your health care provider. Document Revised: 08/15/2021 Document Reviewed: 08/15/2021 Elsevier Patient Education  [place].

## 2022-10-24 NOTE — Chronic Care Management (AMB) (Signed)
Chronic Care Management   CCM RN Visit Note  10/24/2022 Name: Nicholas Black MRN: [id number] DOB: 12-30-1955  Subjective: Nicholas Black is a 67 y.o. year old male who is a primary care patient of Olin Hauser, DO. The patient was referred to the Chronic Care Management team for assistance with care management needs subsequent to provider initiation of CCM services and plan of care.    Today's Visit:  Engaged with patient by telephone for initial visit.     SDOH Interventions Today    Flowsheet Row Most Recent Value  SDOH Interventions   Food Insecurity Interventions Intervention Not Indicated  Housing Interventions Intervention Not Indicated  Transportation Interventions Intervention Not Indicated  Utilities Interventions Intervention Not Indicated  Alcohol Usage Interventions Intervention Not Indicated (Score <7)  Financial Strain Interventions Other (Comment)  [PAP for ozempic and help with cost constraints.]  Physical Activity Interventions Intervention Not Indicated  Stress Interventions Intervention Not Indicated  Social Connections Interventions Intervention Not Indicated         Goals Addressed             This Visit's Progress    CCM Expected Outcome:  Monitor, Self-Manage and Reduce Symptoms of Diabetes       Current Barriers:  Knowledge Deficits related to importance of following the plan of care for effective management of DM Care Coordination needs related to cost constraints for medications, is working with the pharm D for assistance with getting Ozempic in a patient with DM Chronic Disease Management support and education needs related to effective management of DM Lab Results  Component Value Date   HGBA1C 13.4 (A) 10/02/2022     Planned Interventions: Provided education to patient about basic DM disease process; Reviewed medications with patient and discussed importance of medication adherence;        Reviewed prescribed diet with patient  heart healthy/ADA diet, will attach information to MyChart for the patient for review and education. Discussed foods high in carbohydrates  and to eat in moderation foods like pasta, rice, potatoes. The patient states he has cut out french fries and other things that were bad for him. Wants to learn more about foods that are good for him. Education provided on fruits and vegetables ; Counseled on importance of regular laboratory monitoring as prescribed;        Discussed plans with patient for ongoing care management follow up and provided patient with direct contact information for care management team;      Provided patient with written educational materials related to hypo and hyperglycemia and importance of correct treatment;       Reviewed scheduled/upcoming provider appointments including: 01-19-2023 at 3 pm with pcp;         Advised patient, providing education and rationale, to check cbg twice daily and when you have symptoms of low or high blood sugar and record        call provider for findings outside established parameters;       Referral made to pharmacy team for assistance with working with pharm D for assistance with getting Ozempic. The patient is taking Metformin but  had to lower the dose from 1000 mg to 500 mg. MD aware. Higher dose was causing the patient to have a headache;       Review of patient status, including review of consultants reports, relevant laboratory and other test results, and medications completed;       Advised patient to discuss changes in his  DM, questions or concerns with provider;      Screening for signs and symptoms of depression related to chronic disease state;        Assessed social determinant of health barriers;        Review of online resources to help with DM management. Look at the website American Diabetes Association at https://www.diabetes.org . The site has free resources and meal planning information  Symptom Management: Take medications as  prescribed   Attend all scheduled provider appointments Call provider office for new concerns or questions  call the Suicide and Crisis Lifeline: 988 call the [place] National Suicide Prevention Lifeline: 775-169-1251 or TTY: 878-283-3068 TTY 680-145-4449) to talk to a trained counselor call 1-800-273-TALK (toll free, 24 hour hotline) if experiencing a Mental Health or [place]  check feet daily for cuts, sores or redness trim toenails straight across manage portion size wash and dry feet carefully every day wear comfortable, cotton socks wear comfortable, well-fitting shoes  Follow Up Plan: Telephone follow up appointment with care management team member scheduled for: 11-28-2022 at 0900 am       CCM Expected Outcome:  Monitor, Self-Manage, and Reduce Symptoms of Hypertension       Current Barriers:  Chronic Disease Management support and education needs related to effective management of HTN  Planned Interventions: Evaluation of current treatment plan related to hypertension self management and patient's adherence to plan as established by provider;   Provided education to patient re: stroke prevention, s/s of heart attack and stroke; Reviewed prescribed diet heart healthy/ADA diet  Reviewed medications with patient and discussed importance of compliance;  Discussed plans with patient for ongoing care management follow up and provided patient with direct contact information for care management team; Advised patient, providing education and rationale, to monitor blood pressure daily and record, calling PCP for findings outside established parameters;  Reviewed scheduled/upcoming provider appointments including: 01-19-2023 at 0900 am Advised patient to discuss changes in HTN or heart health with provider; Provided education on prescribed diet heart healthy/ADA diet. Will attach information in my Chart for the patient;  Discussed complications of poorly controlled blood  pressure such as heart disease, stroke, circulatory complications, vision complications, kidney impairment, sexual dysfunction;  Screening for signs and symptoms of depression related to chronic disease state;  Assessed social determinant of health barriers;   Symptom Management: Take medications as prescribed   Attend all scheduled provider appointments Call provider office for new concerns or questions  call the Suicide and Crisis Lifeline: 988 call the [place] National Suicide Prevention Lifeline: 548 131 9123 or TTY: 331-089-6962 TTY 857 037 8586) to talk to a trained counselor call 1-800-273-TALK (toll free, 24 hour hotline) if experiencing a Mental Health or [place]  check blood pressure weekly learn about high blood pressure call doctor for signs and symptoms of high blood pressure develop an action plan for high blood pressure keep all doctor appointments take medications for blood pressure exactly as prescribed report new symptoms to your doctor  Follow Up Plan: Telephone follow up appointment with care management team member scheduled for: 11-28-2022 at 0900 am          Plan:Telephone follow up appointment with care management team member scheduled for:  11-28-2022 at 0900 am  Noreene Larsson RN, MSN, CCM RN Care Manager  Chronic Care Management Direct Number: [phone]

## 2022-10-26 ENCOUNTER — Telehealth: Payer: Self-pay | Admitting: Pharmacy Technician

## 2022-10-26 DIAGNOSIS — Z5986 Financial insecurity: Secondary | ICD-10-CM

## 2022-10-26 NOTE — Progress Notes (Signed)
[facility])                                            [place] Team    10/26/2022  Nicholas Black 1956-04-18 [id number]                                      Medication Assistance Referral  Referral From: [facility] Embedded PharmD Wallace Cullens  Medication/Company: Larna Daughters / Novo Nordisk Patient application portion:  Mailed Provider application portion: Faxed  to Dr. Nobie Putnam Provider address/fax verified via: Office website   Homer Miller P. Daden Mahany, [place]  [phone]

## 2022-11-08 LAB — HEMOGLOBIN A1C

## 2022-11-13 ENCOUNTER — Ambulatory Visit: Payer: Medicare HMO | Admitting: Pharmacist

## 2022-11-13 DIAGNOSIS — E1169 Type 2 diabetes mellitus with other specified complication: Secondary | ICD-10-CM

## 2022-11-13 DIAGNOSIS — I10 Essential (primary) hypertension: Secondary | ICD-10-CM

## 2022-11-13 NOTE — Progress Notes (Deleted)
11/13/2022 Name: Nicholas Black MRN: [id number] DOB: [birth year]  Chief Complaint  Patient presents with   Medication Management   Medication Assistance    Nicholas Black is a 67 y.o. year old male who presented for a telephone visit.   They were referred to the pharmacist by their PCP for assistance in managing diabetes, hypertension, hyperlipidemia, and medication access.    Subjective:  Care Team: Primary Care Provider: Smitty Cords, DO ; Next Scheduled Visit: 01/19/2023  Medication Access/Adherence  Current Pharmacy:  [facility] - [place], [place] - [address] [place] [address] Phone: [phone] Fax: [phone]   Patient reports affordability concerns with their medications: Yes  Patient reports access/transportation concerns to their pharmacy: No  Patient reports adherence concerns with their medications:  No    Discuss importance of medication adherence. From review of dispensing history in chart, note: - amlodipine 10 mg Rx last filled 06/19/2022 for 90 day supply - losartan 50 mg Rx last filled 06/19/2022 for 90 day supply - pravastatin 40 mg last filled 06/19/2022 for 90 day supply  Denies using weekly pillbox; denies recent missed doses of medications.    Diabetes:   Current medications:  Metformin ER 500 mg daily Ozempic 0.5 mg weekly on Tuesdays.  Reports tolerating well  Reports currently taking from sample as provided by office - planning to start latest sample tomorrow    Medications tried in the past: metformin ER 1000 mg daily - reports was unable to tolerate due to headache   Current glucose readings: ranging 100s-130s  Reports has been making positive dietary changes to improve blood sugar control, including - Avoiding sugary beverages (sweet tea) now  - Reducing consumption of french fries  Statin: pravastatin 40 mg daily   Current physical activity: working out in the gym x3  days/week and hiking once/week and riding bicycle some  Current medication access support: Currently receiving support from [facility] [employer] with application for [employer] patient assistance from [employer] - Today patient reports received application in mail from [facility]. Reports plans to complete an mail back to [facility] today   Hypertension:  Current medications:  - amlodipine 10 mg daily - losartan 50 mg daily  Denies monitoring home blood pressure recently  Patient denies hypotensive s/sx including dizziness, lightheadedness.   Current physical activity: working out in the gym x3 days/week and hiking once/week and riding bicycle some     Objective:  Lab Results  Component Value Date   HGBA1C 13.4 (A) 10/02/2022    Lab Results  Component Value Date   CREATININE 1.13 09/29/2022   BUN 29 (H) 09/29/2022   NA 127 (L) 09/29/2022   K 4.7 09/29/2022   CL 93 (L) 09/29/2022   CO2 22 09/29/2022    Lab Results  Component Value Date   CHOL 109 01/05/2021   HDL 84 01/05/2021   LDLCALC <10 01/05/2021   TRIG 87 01/05/2021   CHOLHDL 1.3 01/05/2021   BP Readings from Last 3 Encounters:  10/20/22 126/70  10/02/22 134/80  09/29/22 (!) 152/94   Pulse Readings from Last 3 Encounters:  10/20/22 78  10/02/22 86  09/29/22 82     Medications Reviewed Today     Reviewed by Marlowe Sax, RN (Case Manager) on 10/24/22 at 1206  Med List Status: <None>   Medication Order Taking? Sig Documenting Provider Last Dose Status Informant  amLODipine (NORVASC) 10 MG tablet [id number] No  Take 1 tablet (10 mg total) by mouth daily. Karamalegos, Netta Neat, DO Taking Active   BAYER ASPIRIN EC LOW DOSE 81 MG EC tablet [id number] No  [provider] Taking Active   Blood Glucose Monitoring Suppl DEVI [id number] No 1 each by Does not apply route in the morning, at noon, and at bedtime. May substitute to any manufacturer covered by patient's insurance. Phineas Semen, MD Taking  Active   Cholecalciferol (VITAMIN D HIGH POTENCY) 25 MCG (1000 UT) capsule [id number] No Take 2,000 Units by mouth daily. [provider] Taking Active   cyclobenzaprine (FLEXERIL) 10 MG tablet [id number] No Take 1 tablet (10 mg total) by mouth 3 (three) times daily as needed for muscle spasms. Smitty Cords, DO Taking Active   finasteride (PROSCAR) 5 MG tablet [id number] No Take 1 tablet (5 mg total) by mouth daily. Smitty Cords, DO Taking Active   Glucose Blood (BLOOD GLUCOSE TEST STRIPS) STRP [id number] No 1 each by In Vitro route in the morning, at noon, and at bedtime. May substitute to any manufacturer covered by patient's insurance. Phineas Semen, MD Taking Active   Lancet Device MISC [id number] No 1 each by Does not apply route in the morning, at noon, and at bedtime. May substitute to any manufacturer covered by patient's insurance. Phineas Semen, MD Taking Active   Lancets Misc. MISC [id number] No 1 each by Does not apply route in the morning, at noon, and at bedtime. May substitute to any manufacturer covered by patient's insurance. Phineas Semen, MD Taking Active   losartan (COZAAR) 50 MG tablet [id number] No Take 1 tablet (50 mg total) by mouth daily. Smitty Cords, DO Taking Active   metFORMIN (GLUCOPHAGE-XR) 500 MG 24 hr tablet [id number] No Take 2 tablets (1,000 mg total) by mouth daily with breakfast.  Patient taking differently: Take 500 mg by mouth daily with breakfast.   Smitty Cords, DO Taking Active   naproxen (NAPROSYN) 500 MG tablet [id number] No Take 1 tablet (500 mg total) by mouth 2 (two) times daily with a meal. For 2-4 weeks then as needed Smitty Cords, DO Taking Active   Omega-3 Fatty Acids (FISH OIL) 1000 MG CAPS [id number] No Take by mouth daily. [provider] Taking Active   polyethylene glycol-electrolytes (NULYTELY) 420 g solution [id number] No Prepare according to package instructions.  Starting at 5:00 PM: Drink one 8 oz glass of mixture every 15 minutes until you finish half of the jug. Five hours prior to procedure, drink 8 oz glass of mixture every 15 minutes until it is all gone. Make sure you do not drink anything 4 hours prior to your procedure. Pasty Spillers, MD Taking Active   pravastatin (PRAVACHOL) 40 MG tablet [id number] No Take 1 tablet (40 mg total) by mouth daily. Smitty Cords, DO Taking Active   Semaglutide,0.25 or 0.5MG /DOS, (OZEMPIC, 0.25 OR 0.5 MG/DOSE,) 2 MG/3ML SOPN [id number] No Inject 0.5 mg into the skin once a week. [provider] Taking Active   [facility] injection [id number] No Inject 0.5 mL into muscle for shingles vaccine. Repeat dose in 2-6 months. Smitty Cords, DO Taking Active   Sodium Sulfate-Mag Sulfate-KCl (SUTAB) [id number] MG TABS [id number] No At 5 PM take 12 tablets using the 8 oz cup provided in the kit drinking 5 cups of water and 5 hours before your procedure repeat the same process. Pasty Spillers, MD Taking Active  Assessment/Plan:   Counsel patient on importance of medication adherence  Encourage patient to contact pharmacy for refills of amlodipine, losartan and pravastatin  Diabetes: - Currently uncontrolled - Reviewed long term cardiovascular and renal outcomes of uncontrolled blood sugar - Reviewed dietary modifications including importance of having regular well-balanced meals throughout the day, while controlling carbohydrate portion sizes  Encourage patient to continue to avoid consumption of sugary beverages  Encourage patient to increase consumption of non-starchy vegetables - Recommend to check glucose and have record of results to review during upcoming appointments  - Meets financial criteria for Ozempic patient assistance program through [employer]. Continue to collaborate with provider, [facility], and patient to pursue assistance  Patient to complete and  mail back application to [facility]  Hypertension: - Reviewed long term cardiovascular and renal outcomes of uncontrolled blood pressure - Reviewed appropriate blood pressure monitoring technique and reviewed goal blood pressure. Recommended to check home blood pressure and heart rate and have record of results to review during upcoming appointments  - Recommend to limit consumption of salt/sodium   Follow Up Plan: Clinical Pharmacist will follow up with patient by telephone as previously schedule on 12/11/2022 at 11:30 AM   Estelle Grumbles, PharmD, Patsy [place], CPP Clinical Pharmacist [facility] [phone]

## 2022-11-13 NOTE — Addendum Note (Signed)
Addended by: Estelle Grumbles A on: 11/13/2022 11:32 AM   Modules accepted: Level of Service

## 2022-11-13 NOTE — Patient Instructions (Signed)
Visit Information  Thank you for taking time to visit with me today. Please don't hesitate to contact me if I can be of assistance to you before our next scheduled telephone appointment.  Following are the goals we discussed today:   Goals Addressed             This Visit's Progress    Pharmacy Goals       Our goal A1c is less than 7%. This corresponds with fasting sugars less than 130 and 2 hour after meal sugars less than 180. Please keep a log of your results when checking your blood sugar   Our goal bad cholesterol, or LDL, is less than 70 . This is why it is important to continue taking your pravastatin.  Check your blood pressure twice weekly, and any time you have concerning symptoms like headache, chest pain, dizziness, shortness of breath, or vision changes.   Our goal is less than 130/80.  To appropriately check your blood pressure, make sure you do the following:  1) Avoid caffeine, exercise, or tobacco products for 30 minutes before checking. Empty your bladder. 2) Sit with your back supported in a flat-backed chair. Rest your arm on something flat (arm of the chair, table, etc). 3) Sit still with your feet flat on the floor, resting, for at least 5 minutes.  4) Check your blood pressure. Take 1-2 readings.  5) Write down these readings and bring with you to any provider appointments.  Bring your home blood pressure machine with you to a provider's office for accuracy comparison at least once a year.   Make sure you take your blood pressure medications before you come to any office visit, even if you were asked to fast for labs.   Estelle Grumbles, PharmD, Patsy [place], CPP Clinical Pharmacist [facility] [phone]         Our next appointment is by telephone on 12/11/2022 11:30 AM   Please call the care guide team at [phone] if you need to cancel or reschedule your appointment.    Patient verbalizes understanding of  instructions and care plan provided today and agrees to view in MyChart. Active MyChart status and patient understanding of how to access instructions and care plan via MyChart confirmed with patient.

## 2022-11-13 NOTE — Chronic Care Management (AMB) (Addendum)
Chronic Care Management CCM Pharmacy Note  11/13/2022 Name:  Nicholas Black MRN:  [id number] DOB:  February 09, 1956   Subjective: Nicholas Black is an 67 y.o. year old male who is a primary patient of Smitty Cords, DO.  The CCM team was consulted for assistance with disease management and care coordination needs.    Engaged with patient by telephone for follow up visit for pharmacy case management and/or care coordination services.   Objective:  Medications Reviewed Today     Reviewed by Marlowe Sax, RN (Case Manager) on 10/24/22 at 1206  Med List Status: <None>   Medication Order Taking? Sig Documenting Provider Last Dose Status Informant  amLODipine (NORVASC) 10 MG tablet [id number] No Take 1 tablet (10 mg total) by mouth daily. Karamalegos, Netta Neat, DO Taking Active   BAYER ASPIRIN EC LOW DOSE 81 MG EC tablet [id number] No  [provider] Taking Active   Blood Glucose Monitoring Suppl DEVI [id number] No 1 each by Does not apply route in the morning, at noon, and at bedtime. May substitute to any manufacturer covered by patient's insurance. Phineas Semen, MD Taking Active   Cholecalciferol (VITAMIN D HIGH POTENCY) 25 MCG (1000 UT) capsule [id number] No Take 2,000 Units by mouth daily. [provider] Taking Active   cyclobenzaprine (FLEXERIL) 10 MG tablet [id number] No Take 1 tablet (10 mg total) by mouth 3 (three) times daily as needed for muscle spasms. Smitty Cords, DO Taking Active   finasteride (PROSCAR) 5 MG tablet [id number] No Take 1 tablet (5 mg total) by mouth daily. Smitty Cords, DO Taking Active   Glucose Blood (BLOOD GLUCOSE TEST STRIPS) STRP [id number] No 1 each by In Vitro route in the morning, at noon, and at bedtime. May substitute to any manufacturer covered by patient's insurance. Phineas Semen, MD Taking Active   Lancet Device MISC [id number] No 1 each by Does not apply route in the morning, at noon, and at  bedtime. May substitute to any manufacturer covered by patient's insurance. Phineas Semen, MD Taking Active   Lancets Misc. MISC [id number] No 1 each by Does not apply route in the morning, at noon, and at bedtime. May substitute to any manufacturer covered by patient's insurance. Phineas Semen, MD Taking Active   losartan (COZAAR) 50 MG tablet [id number] No Take 1 tablet (50 mg total) by mouth daily. Smitty Cords, DO Taking Active   metFORMIN (GLUCOPHAGE-XR) 500 MG 24 hr tablet [id number] No Take 2 tablets (1,000 mg total) by mouth daily with breakfast.  Patient taking differently: Take 500 mg by mouth daily with breakfast.   Smitty Cords, DO Taking Active   naproxen (NAPROSYN) 500 MG tablet [id number] No Take 1 tablet (500 mg total) by mouth 2 (two) times daily with a meal. For 2-4 weeks then as needed Smitty Cords, DO Taking Active   Omega-3 Fatty Acids (FISH OIL) 1000 MG CAPS [id number] No Take by mouth daily. [provider] Taking Active   polyethylene glycol-electrolytes (NULYTELY) 420 g solution [id number] No Prepare according to package instructions. Starting at 5:00 PM: Drink one 8 oz glass of mixture every 15 minutes until you finish half of the jug. Five hours prior to procedure, drink 8 oz glass of mixture every 15 minutes until it is all gone. Make sure you do not drink anything 4 hours prior to your procedure. Pasty Spillers, MD Taking Active   pravastatin (PRAVACHOL) 40 MG tablet [id number] No Take 1 tablet (  40 mg total) by mouth daily. Smitty Cords, DO Taking Active   Semaglutide,0.25 or 0.5MG /DOS, (OZEMPIC, 0.25 OR 0.5 MG/DOSE,) 2 MG/3ML SOPN [id number] No Inject 0.5 mg into the skin once a week. [provider] Taking Active   [facility] injection [id number] No Inject 0.5 mL into muscle for shingles vaccine. Repeat dose in 2-6 months. Smitty Cords, DO Taking Active   Sodium Sulfate-Mag Sulfate-KCl (SUTAB)  [phone] MG TABS [id number] No At 5 PM take 12 tablets using the 8 oz cup provided in the kit drinking 5 cups of water and 5 hours before your procedure repeat the same process. Pasty Spillers, MD Taking Active             Pertinent Labs:  Lab Results  Component Value Date   HGBA1C 13.4 (A) 10/02/2022   Lab Results  Component Value Date   CHOL 109 01/05/2021   HDL 84 01/05/2021   LDLCALC <10 01/05/2021   TRIG 87 01/05/2021   CHOLHDL 1.3 01/05/2021   Lab Results  Component Value Date   CREATININE 1.13 09/29/2022   BUN 29 (H) 09/29/2022   NA 127 (L) 09/29/2022   K 4.7 09/29/2022   CL 93 (L) 09/29/2022   CO2 22 09/29/2022   BP Readings from Last 3 Encounters:  10/20/22 126/70  10/02/22 134/80  09/29/22 (!) 152/94   Pulse Readings from Last 3 Encounters:  10/20/22 78  10/02/22 86  09/29/22 82      SDOH:  (Social Determinants of Health) assessments and interventions performed:  SDOH Interventions    Flowsheet Row Chronic Care Management from 10/24/2022 in [facility] Office Visit from 10/20/2022 in [facility] Office Visit from 10/02/2022 in [facility] Office Visit from 03[facility]28/2022 in [facility] Telemedicine from 01/17/2021 in [facility]  SDOH Interventions       Food Insecurity Interventions Intervention Not Indicated -- -- -- --  Housing Interventions Intervention Not Indicated -- -- -- --  Transportation Interventions Intervention Not Indicated -- -- -- --  Utilities Interventions Intervention Not Indicated -- -- -- --  Alcohol Usage Interventions Intervention Not Indicated (Score <7) -- -- -- --  Depression Interventions/Treatment  -- Counseling Counseling, Medication Counseling, Medication Counseling, Medication  Financial Strain Interventions Other (Comment)  [PAP for ozempic and help with cost constraints.] -- -- -- --   Physical Activity Interventions Intervention Not Indicated -- -- -- --  Stress Interventions Intervention Not Indicated -- -- -- --  Social Connections Interventions Intervention Not Indicated -- -- -- --       CCM Care Plan  Review of patient past medical history, allergies, medications, health status, including review of consultants reports, laboratory and other test data, was performed as part of comprehensive evaluation and provision of chronic care management services.   Care Plan : General Pharmacy (Adult)  Updates made by Manuela Neptune, RPH-CPP since 11/13/2022 12:00 AM     Problem: Disease Progression      Long-Range Goal: Disease Progression Prevented or Minimized   Start Date: 11/13/2022  This Visit's Progress: On track  Priority: High  Note:   Current Barriers:  Unable to independently afford treatment regimen Unable to achieve control of blood sugar    Pharmacist Clinical Goal(s):  patient will verbalize ability to afford treatment regimen achieve control of blood sugar as evidenced by A1C  through collaboration with PharmD  and provider.    Interventions: 1:1 collaboration with Smitty Cords, DO regarding development and update of comprehensive plan of care as evidenced by provider attestation and co-signature Inter-disciplinary care team collaboration (see longitudinal plan of care)  Discuss importance of medication adherence. From review of dispensing history in chart, note: - amlodipine 10 mg Rx last filled 06/19/2022 for 90 day supply - losartan 50 mg Rx last filled 06/19/2022 for 90 day supply - pravastatin 40 mg last filled 06/19/2022 for 90 day supply  Denies using weekly pillbox; denies recent missed doses of medications.    Diabetes:   Current medications:  Metformin ER 500 mg daily Ozempic 0.5 mg weekly on Tuesdays.  Reports tolerating well  Reports currently taking from sample as provided by office - planning to start latest  sample tomorrow    Medications tried in the past: metformin ER 1000 mg daily - reports was unable to tolerate due to headache   Current glucose readings: ranging 100s-130s  Reports has been making positive dietary changes to improve blood sugar control, including - Avoiding sugary beverages (sweet tea) now  - Reducing consumption of french fries  Statin: pravastatin 40 mg daily   Current physical activity: working out in the gym x3 days/week and hiking once/week and riding bicycle some  Current medication access support: Currently receiving support from [facility] CPhT [employer] with application for [employer] patient assistance from [employer] - Today patient reports received application in mail from [facility] CPhT. Reports plans to complete an mail back to [facility] CPhT today   Hypertension:  Current medications:  - amlodipine 10 mg daily - losartan 50 mg daily  Denies monitoring home blood pressure recently  Patient denies hypotensive s/sx including dizziness, lightheadedness.   Current physical activity: working out in the gym x3 days/week and hiking once/week and riding bicycle some   Patient Goals/Self-Care Activities patient will:  - take medications as prescribed as evidenced by patient report and record review focus on medication adherence check glucose, document, and provide at future appointments check blood pressure, document, and provide at future appointments target a minimum of 150 minutes of moderate intensity exercise weekly        Assessment/Plan:   Counsel patient on importance of medication adherence  Encourage patient to contact pharmacy for refills of amlodipine, losartan and pravastatin  Diabetes: - Currently uncontrolled - Reviewed long term cardiovascular and renal outcomes of uncontrolled blood sugar - Reviewed dietary modifications including importance of having regular well-balanced meals throughout the day, while controlling carbohydrate portion  sizes  Encourage patient to continue to avoid consumption of sugary beverages  Encourage patient to increase consumption of non-starchy vegetables - Recommend to check glucose and have record of results to review during upcoming appointments  - Meets financial criteria for Ozempic patient assistance program through [employer]. Continue to collaborate with provider, [facility] CPhT, and patient to pursue assistance  Patient to complete and mail back application to [facility] CPhT  Hypertension: - Reviewed long term cardiovascular and renal outcomes of uncontrolled blood pressure - Reviewed appropriate blood pressure monitoring technique and reviewed goal blood pressure. Recommended to check home blood pressure and heart rate and have record of results to review during upcoming appointments  - Recommend to limit consumption of salt/sodium   Follow Up Plan: Clinical Pharmacist will follow up with patient by telephone as previously schedule on 12/11/2022 at 11:30 AM   Estelle Grumbles, PharmD, Patsy [place], CPP Clinical Pharmacist [facility] [phone]

## 2022-11-21 DIAGNOSIS — E1159 Type 2 diabetes mellitus with other circulatory complications: Secondary | ICD-10-CM

## 2022-11-21 DIAGNOSIS — Z7984 Long term (current) use of oral hypoglycemic drugs: Secondary | ICD-10-CM | POA: Diagnosis not present

## 2022-11-21 DIAGNOSIS — I10 Essential (primary) hypertension: Secondary | ICD-10-CM

## 2022-11-28 ENCOUNTER — Telehealth: Payer: Self-pay

## 2022-11-28 ENCOUNTER — Telehealth: Payer: Medicare HMO

## 2022-11-28 NOTE — Telephone Encounter (Signed)
   CCM RN Visit Note   11-28-2022 Name: [name] MRN: [id number]      DOB: Jun 01, 1956  Subjective: [name] is a 67 y.o. year old male who is a primary care patient of @PCP . The patient was referred to the Chronic Care Management team for assistance with care management needs subsequent to provider initiation of CCM services and plan of care.      An unsuccessful telephone outreach was attempted today to contact the patient about Chronic Care Management needs.    Plan:The care management team will reach out to the patient again over the next 30 to 60  days.  Alto Denver RN, MSN, CCM RN Care Manager  Chronic Care Management Direct Number: [phone]

## 2022-12-11 ENCOUNTER — Telehealth: Payer: Medicare HMO

## 2022-12-11 ENCOUNTER — Telehealth: Payer: Self-pay | Admitting: Pharmacist

## 2022-12-11 NOTE — Telephone Encounter (Signed)
   Outreach Note  12/11/2022 Name: Nicholas Black MRN: [id number] DOB: 04-17-1956  Referred by: Smitty Cords, DO Reason for referral : No chief complaint on file.   Was unable to reach patient via telephone today and unable to leave a message as no voicemail picks up   Follow Up Plan: Will collaborate with Care Guide to outreach to schedule follow up with me  Estelle Grumbles, PharmD, [facility] Clinical Pharmacist [facility] [phone]

## 2022-12-14 ENCOUNTER — Telehealth: Payer: Self-pay

## 2022-12-14 NOTE — Progress Notes (Signed)
  Care Coordination Note  12/14/2022 Name: Nicholas Black MRN: [id number] DOB: 04-09-1956  Nicholas Black is a 67 y.o. year old male who is a primary care patient of Smitty Cords, DO and is actively engaged with the Chronic Care Management team. I reached out to [name] by phone today to assist with re-scheduling a follow up visit with the Pharmacist  Follow up plan: Unsuccessful telephone outreach attempt made. The care management team will reach out to the patient again over the next 7 days.  If patient returns call to provider office, please advise to call CCM Care Guide Penne Lash  at [phone]  Penne Lash, RMA Care Guide [facility]  [place], [place] [address] Direct Dial: [phone] [email or web address]

## 2022-12-21 ENCOUNTER — Other Ambulatory Visit: Payer: Medicare HMO

## 2022-12-21 NOTE — Progress Notes (Signed)
  Care Coordination Note  12/21/2022 Name: Nicholas Black MRN: [id number] DOB: 20-Mar-1956  Nicholas Black is a 67 y.o. year old male who is a primary care patient of Smitty Cords, DO and is actively engaged with the Chronic Care Management team. I reached out to [name] by phone today to assist with re-scheduling a follow up visit with the Pharmacist  Follow up plan: Unsuccessful telephone outreach attempt made. A HIPAA compliant phone message was left for the patient providing contact information and requesting a return call.  The care management team will reach out to the patient again over the next 7 days.  If patient returns call to provider office, please advise to call CCM Care Guide Penne Lash  at [phone]  Penne Lash, RMA Care Guide [facility]  [place], [place] [address] Direct Dial: [phone] [email or web address]

## 2022-12-28 ENCOUNTER — Encounter: Payer: Medicare HMO | Admitting: Family Medicine

## 2023-01-01 NOTE — Progress Notes (Signed)
  Care Coordination Note  01/01/2023 Name: [name] MRN: [id number] DOB: 12-Nov-1955  Nicholas Black is a 67 y.o. year old male who is a primary care patient of Smitty Cords, DO and is actively engaged with the Chronic Care Management team. I reached out to [name] by phone today to assist with re-scheduling a follow up visit with the Pharmacist  Follow up plan: Telephone appointment with care management team member scheduled for:01/12/2023  Penne Lash, RMA Care Guide [facility]  [place], [place] [address] Direct Dial: [phone] [email or web address]

## 2023-01-03 LAB — HM DIABETES EYE EXAM

## 2023-01-11 ENCOUNTER — Other Ambulatory Visit: Payer: Medicare HMO

## 2023-01-12 ENCOUNTER — Ambulatory Visit: Payer: Medicare HMO | Admitting: Pharmacist

## 2023-01-12 DIAGNOSIS — I10 Essential (primary) hypertension: Secondary | ICD-10-CM

## 2023-01-12 DIAGNOSIS — E1169 Type 2 diabetes mellitus with other specified complication: Secondary | ICD-10-CM

## 2023-01-12 NOTE — Progress Notes (Unsigned)
01/12/2023 Name: Nicholas Black MRN: [id number] DOB: 12/23/1955  Chief Complaint  Patient presents with   Medication Adherence   Medication Management    Nicholas Black is a 67 y.o. year old male who presented for a telephone visit.   They were referred to the pharmacist by their PCP for assistance in managing diabetes, hypertension, hyperlipidemia, and medication access.    Subjective:  Care Team: Primary Care Provider: Smitty Cords, DO ; Next Scheduled Visit: 01/19/2023 Nurse Care Manager: Carma Lair, RN; Next Scheduled Visit: 01/23/2023  Medication Access/Adherence  Current Pharmacy:  [facility] - [place], [place] - [address] [place] [address] Phone: [phone] Fax: [phone]   Patient reports affordability concerns with their medications: No  Patient reports access/transportation concerns to their pharmacy: No  Patient reports adherence concerns with their medications:  Yes    Reports not currently using weekly pillbox; admits may be missing doses of medication  Discuss importance of medication adherence. From review of dispensing history in chart, note: - amlodipine 10 mg Rx last filled 06/19/2022 for 90 day supply - losartan 50 mg Rx last filled 06/19/2022 for 90 day supply - pravastatin 40 mg last filled 06/19/2022 for 90 day supply    Diabetes:   Current medications:  Metformin ER 500 mg daily Ozempic 0.5 mg weekly on Tuesdays.  Reports tolerating well  Reports currently taking from sample as provided by office - planning to start latest sample tomorrow     Medications tried in the past: metformin ER 1000 mg daily - reports was unable to tolerate due to headache   Current glucose readings: ranging 100s-130s   Reports has been making positive dietary changes to improve blood sugar control, including - Avoiding sugary beverages (sweet tea) now  - Reducing consumption of french fries    Statin: pravastatin 40 mg daily   Current physical activity: working out in the gym x3 days/week and hiking once/week and riding bicycle some   Current medication access support: Currently receiving support from [facility] CPhT [employer] with application for [employer] patient assistance from [employer] - Today patient reports received application in mail from [facility] CPhT. Reports plans to complete an mail back to [facility] CPhT today     Hypertension:   Current medications:  - amlodipine 10 mg daily - losartan 50 mg daily   Denies monitoring home blood pressure recently   Patient denies hypotensive s/sx including dizziness, lightheadedness.    Current physical activity: working out in the gym x3 days/week and hiking once/week and riding bicycle some   Objective:  Lab Results  Component Value Date   HGBA1C 13.4 (A) 10/02/2022    Lab Results  Component Value Date   CREATININE 1.13 09/29/2022   BUN 29 (H) 09/29/2022   NA 127 (L) 09/29/2022   K 4.7 09/29/2022   CL 93 (L) 09/29/2022   CO2 22 09/29/2022    Lab Results  Component Value Date   CHOL 109 01/05/2021   HDL 84 01/05/2021   LDLCALC <10 01/05/2021   TRIG 87 01/05/2021   CHOLHDL 1.3 01/05/2021    Medications Reviewed Today     Reviewed by Manuela Neptune, RPH-CPP (Pharmacist) on 11/13/22 at 1135  Med List Status: <None>   Medication Order Taking? Sig Documenting Provider Last Dose Status Informant  amLODipine (NORVASC) 10 MG tablet [id number] No Take 1 tablet (10 mg total) by mouth daily. Smitty Cords, DO Taking Active  BAYER ASPIRIN EC LOW DOSE 81 MG EC tablet [id number] No  [provider] Taking Active   Blood Glucose Monitoring Suppl DEVI [id number] No 1 each by Does not apply route in the morning, at noon, and at bedtime. May substitute to any manufacturer covered by patient's insurance. Phineas Semen, MD Taking Active   Cholecalciferol (VITAMIN D HIGH POTENCY) 25 MCG (1000 UT) capsule  [id number] No Take 2,000 Units by mouth daily. [provider] Taking Active   cyclobenzaprine (FLEXERIL) 10 MG tablet [id number] No Take 1 tablet (10 mg total) by mouth 3 (three) times daily as needed for muscle spasms. Smitty Cords, DO Taking Active   finasteride (PROSCAR) 5 MG tablet [id number] No Take 1 tablet (5 mg total) by mouth daily. Smitty Cords, DO Taking Active   losartan (COZAAR) 50 MG tablet [id number] No Take 1 tablet (50 mg total) by mouth daily. Smitty Cords, DO Taking Active   metFORMIN (GLUCOPHAGE-XR) 500 MG 24 hr tablet [id number] No Take 2 tablets (1,000 mg total) by mouth daily with breakfast.  Patient taking differently: Take 500 mg by mouth daily with breakfast.   Smitty Cords, DO Taking Active   naproxen (NAPROSYN) 500 MG tablet [id number] No Take 1 tablet (500 mg total) by mouth 2 (two) times daily with a meal. For 2-4 weeks then as needed Smitty Cords, DO Taking Active   Omega-3 Fatty Acids (FISH OIL) 1000 MG CAPS [id number] No Take by mouth daily. [provider] Taking Active   polyethylene glycol-electrolytes (NULYTELY) 420 g solution [id number] No Prepare according to package instructions. Starting at 5:00 PM: Drink one 8 oz glass of mixture every 15 minutes until you finish half of the jug. Five hours prior to procedure, drink 8 oz glass of mixture every 15 minutes until it is all gone. Make sure you do not drink anything 4 hours prior to your procedure. Pasty Spillers, MD Taking Active   pravastatin (PRAVACHOL) 40 MG tablet [id number] No Take 1 tablet (40 mg total) by mouth daily. Smitty Cords, DO Taking Active   Semaglutide,0.25 or 0.5MG /DOS, (OZEMPIC, 0.25 OR 0.5 MG/DOSE,) 2 MG/3ML SOPN [id number] No Inject 0.5 mg into the skin once a week. [provider] Taking Active   [facility] injection [id number] No Inject 0.5 mL into muscle for shingles vaccine. Repeat dose in 2-6 months.  Smitty Cords, DO Taking Active   Sodium Sulfate-Mag Sulfate-KCl (SUTAB) [phone] MG TABS [id number] No At 5 PM take 12 tablets using the 8 oz cup provided in the kit drinking 5 cups of water and 5 hours before your procedure repeat the same process. Pasty Spillers, MD Taking Active               Assessment/Plan:    Counsel patient on importance of medication adherence   Encourage patient to contact pharmacy for refills of amlodipine, losartan and pravastatin   Diabetes: - Currently uncontrolled - Reviewed long term cardiovascular and renal outcomes of uncontrolled blood sugar - Reviewed dietary modifications including importance of having regular well-balanced meals throughout the day, while controlling carbohydrate portion sizes             Encourage patient to continue to avoid consumption of sugary beverages             Encourage patient to increase consumption of non-starchy vegetables - Recommend to check glucose and have record of results to review during upcoming appointments  - Meets financial criteria for Ozempic  patient assistance program through [employer]. Continue to collaborate with provider, [facility] CPhT, and patient to pursue assistance             Patient to complete and mail back application to [facility] CPhT   Hypertension: - Reviewed long term cardiovascular and renal outcomes of uncontrolled blood pressure - Reviewed appropriate blood pressure monitoring technique and reviewed goal blood pressure. Recommended to check home blood pressure and heart rate and have record of results to review during upcoming appointments  - Recommend to limit consumption of salt/sodium     Follow Up Plan: Clinical Pharmacist will follow up with patient by telephone on 02/09/2023 at 9:15 AM    Estelle Grumbles, PharmD, Patsy [place], CPP Clinical Pharmacist [facility] [phone]

## 2023-01-14 NOTE — Patient Instructions (Signed)
Goals Addressed             This Visit's Progress    Pharmacy Goals       Please start using a weekly pillbox to organize your medications/help with taking them consistently.  Our goal A1c is less than 7%. This corresponds with fasting sugars less than 130 and 2 hour after meal sugars less than 180. Please keep a log of your results when checking your blood sugar   Our goal bad cholesterol, or LDL, is less than 70 . This is why it is important to continue taking your pravastatin.  Check your blood pressure twice weekly, and any time you have concerning symptoms like headache, chest pain, dizziness, shortness of breath, or vision changes.   Our goal is less than 130/80.  To appropriately check your blood pressure, make sure you do the following:  1) Avoid caffeine, exercise, or tobacco products for 30 minutes before checking. Empty your bladder. 2) Sit with your back supported in a flat-backed chair. Rest your arm on something flat (arm of the chair, table, etc). 3) Sit still with your feet flat on the floor, resting, for at least 5 minutes.  4) Check your blood pressure. Take 1-2 readings.  5) Write down these readings and bring with you to any provider appointments.  Bring your home blood pressure machine with you to a provider's office for accuracy comparison at least once a year.   Make sure you take your blood pressure medications before you come to any office visit, even if you were asked to fast for labs.   Estelle Grumbles, PharmD, Patsy [place], CPP Clinical Pharmacist [facility] [phone]

## 2023-01-15 ENCOUNTER — Telehealth: Payer: Self-pay

## 2023-01-15 NOTE — Telephone Encounter (Signed)
Submitted application ELECTRONICALLY  for OZEMPIC to [employer] for patient assistance PROCESSING.   Phone: [phone]

## 2023-01-15 NOTE — Telephone Encounter (Signed)
-----   Message from Manuela Neptune, RPH-CPP sent at 01/14/2023 10:00 AM EDT ----- Nicholas Black, this is the patient that I Teams messaged you about on Friday. Would you please assist him with applying for Novo Nordisk PAP online for Ozempic 0.5 mg weekly as prescribed by PCP, Dr. Althea Charon? Patient was unable to provide full income documentation for self/spouse.  Thank you!  Gentry Fitz

## 2023-01-18 ENCOUNTER — Encounter: Payer: Self-pay | Admitting: Family Medicine

## 2023-01-18 DIAGNOSIS — E7800 Pure hypercholesterolemia, unspecified: Secondary | ICD-10-CM | POA: Diagnosis not present

## 2023-01-18 DIAGNOSIS — R7303 Prediabetes: Secondary | ICD-10-CM | POA: Diagnosis not present

## 2023-01-18 DIAGNOSIS — I10 Essential (primary) hypertension: Secondary | ICD-10-CM | POA: Diagnosis not present

## 2023-01-18 DIAGNOSIS — N138 Other obstructive and reflux uropathy: Secondary | ICD-10-CM | POA: Diagnosis not present

## 2023-01-18 DIAGNOSIS — Z0000 Encounter for general adult medical examination without abnormal findings: Secondary | ICD-10-CM | POA: Diagnosis not present

## 2023-01-18 DIAGNOSIS — N401 Enlarged prostate with lower urinary tract symptoms: Secondary | ICD-10-CM | POA: Diagnosis not present

## 2023-01-18 NOTE — Telephone Encounter (Signed)
Received notification from [employer] regarding approval for OZEMPIC. Patient assistance APPROVED from 01/17/2023 to 07/24/2023 .  Phone: [phone]  PLEASE BE ADVISED  PT WILL NEED TO CALL THE NUMBER ABOVE TO FIND OUT SHIPPING INFO.  Georga [facility] Patient Advocate [phone]

## 2023-01-19 ENCOUNTER — Encounter: Payer: Self-pay | Admitting: Family Medicine

## 2023-01-19 ENCOUNTER — Ambulatory Visit (INDEPENDENT_AMBULATORY_CARE_PROVIDER_SITE_OTHER): Payer: Medicare HMO | Admitting: Family Medicine

## 2023-01-19 VITALS — BP 134/84 | HR 93 | Temp 96.4°F | Wt 198.0 lb

## 2023-01-19 DIAGNOSIS — E1169 Type 2 diabetes mellitus with other specified complication: Secondary | ICD-10-CM

## 2023-01-19 DIAGNOSIS — F411 Generalized anxiety disorder: Secondary | ICD-10-CM | POA: Diagnosis not present

## 2023-01-19 DIAGNOSIS — N401 Enlarged prostate with lower urinary tract symptoms: Secondary | ICD-10-CM

## 2023-01-19 DIAGNOSIS — N138 Other obstructive and reflux uropathy: Secondary | ICD-10-CM

## 2023-01-19 DIAGNOSIS — F410 Panic disorder [episodic paroxysmal anxiety] without agoraphobia: Secondary | ICD-10-CM

## 2023-01-19 DIAGNOSIS — Z122 Encounter for screening for malignant neoplasm of respiratory organs: Secondary | ICD-10-CM

## 2023-01-19 DIAGNOSIS — R972 Elevated prostate specific antigen [PSA]: Secondary | ICD-10-CM

## 2023-01-19 DIAGNOSIS — Z23 Encounter for immunization: Secondary | ICD-10-CM

## 2023-01-19 DIAGNOSIS — Z0000 Encounter for general adult medical examination without abnormal findings: Secondary | ICD-10-CM

## 2023-01-19 DIAGNOSIS — Z87891 Personal history of nicotine dependence: Secondary | ICD-10-CM | POA: Diagnosis not present

## 2023-01-19 DIAGNOSIS — I10 Essential (primary) hypertension: Secondary | ICD-10-CM | POA: Diagnosis not present

## 2023-01-19 DIAGNOSIS — E785 Hyperlipidemia, unspecified: Secondary | ICD-10-CM

## 2023-01-19 LAB — CBC WITH DIFFERENTIAL/PLATELET
Absolute Monocytes: 509 cells/uL (ref 200–950)
Basophils Absolute: 32 cells/uL (ref 0–200)
Basophils Relative: 0.6 %
Eosinophils Absolute: 42 cells/uL (ref 15–500)
Eosinophils Relative: 0.8 %
HCT: 42 % (ref 38.5–50.0)
Hemoglobin: 13.3 g/dL (ref 13.2–17.1)
Lymphs Abs: 2152 cells/uL (ref 850–3900)
MCH: 29 pg (ref 27.0–33.0)
MCHC: 31.7 g/dL — ABNORMAL LOW (ref 32.0–36.0)
MCV: 91.7 fL (ref 80.0–100.0)
MPV: 10 fL (ref 7.5–12.5)
Monocytes Relative: 9.6 %
Neutro Abs: 2565 cells/uL (ref 1500–7800)
Neutrophils Relative %: 48.4 %
Platelets: 247 10*3/uL (ref 140–400)
RBC: 4.58 10*6/uL (ref 4.20–5.80)
RDW: 13 % (ref 11.0–15.0)
Total Lymphocyte: 40.6 %
WBC: 5.3 10*3/uL (ref 3.8–10.8)

## 2023-01-19 LAB — COMPLETE METABOLIC PANEL WITH GFR
AG Ratio: 1.8 (calc) (ref 1.0–2.5)
ALT: 17 U/L (ref 9–46)
AST: 19 U/L (ref 10–35)
Albumin: 4.6 g/dL (ref 3.6–5.1)
Alkaline phosphatase (APISO): 55 U/L (ref 35–144)
BUN: 16 mg/dL (ref 7–25)
CO2: 28 mmol/L (ref 20–32)
Calcium: 9.7 mg/dL (ref 8.6–10.3)
Chloride: 107 mmol/L (ref 98–110)
Creat: 0.83 mg/dL (ref 0.70–1.35)
Globulin: 2.6 g/dL (calc) (ref 1.9–3.7)
Glucose, Bld: 105 mg/dL — ABNORMAL HIGH (ref 65–99)
Potassium: 4.4 mmol/L (ref 3.5–5.3)
Sodium: 144 mmol/L (ref 135–146)
Total Bilirubin: 0.3 mg/dL (ref 0.2–1.2)
Total Protein: 7.2 g/dL (ref 6.1–8.1)
eGFR: 95 mL/min/{1.73_m2} (ref >=60)

## 2023-01-19 LAB — TSH: TSH: 0.63 mIU/L (ref 0.40–4.50)

## 2023-01-19 LAB — HEMOGLOBIN A1C
Hgb A1c MFr Bld: 6.8 % of total Hgb — ABNORMAL HIGH (ref <5.7)
Mean Plasma Glucose: 148 mg/dL
eAG (mmol/L): 8.2 mmol/L

## 2023-01-19 LAB — LIPID PANEL
Cholesterol: 111 mg/dL (ref <200)
HDL: 80 mg/dL (ref >=40)
LDL Cholesterol (Calc): 17 mg/dL (calc)
Non-HDL Cholesterol (Calc): 31 mg/dL (calc) (ref <130)
Total CHOL/HDL Ratio: 1.4 (calc) (ref <5.0)
Triglycerides: 65 mg/dL (ref <150)

## 2023-01-19 LAB — PSA: PSA: 3.8 ng/mL (ref <=4.00)

## 2023-01-19 MED ORDER — OZEMPIC (0.25 OR 0.5 MG/DOSE) 2 MG/3ML ~~LOC~~ SOPN: 0.5000 mg | PEN_INJECTOR | SUBCUTANEOUS | 1 refills | Status: DC

## 2023-01-19 MED ORDER — SHINGRIX 50 MCG/0.5ML IM SUSR: INTRAMUSCULAR | 1 refills | Status: DC

## 2023-01-19 NOTE — Assessment & Plan Note (Signed)
Controlled cholesterol on statin and lifestyle On Pravastatin  Encourage improved lifestyle - low carb/cholesterol, reduce portion size, continue improving regular exercise

## 2023-01-19 NOTE — Progress Notes (Signed)
Subjective:    Patient ID: Nicholas Black, male    DOB: [birth year], 67 y.o.   MRN: [id number]  Nicholas Black is a 67 y.o. male presenting on 01/19/2023 for Annual Exam   HPI  CHRONIC DM, Type 2: New diagnosis early March 2023, A1c >13 Now improvement overall with glucose control A1c 6.8 Meds: Metformin XR 500mg  daily, Ozempic 0.5mg  weekly inj Reports  good compliance. Tolerating well w/o side-effects Currently on ARB Diet improved lower carb sugar now, less tea, less fried Denies hypoglycemia, polyuria, visual changes, numbness or tingling  CHRONIC HTN: Reports he has had been treated for HTN for past >4-5 years. He checks BP regularly at home, has had some fluctuation on readings but overall controlled Home readings 140 or less Current Meds - Amlodipine 10mg  daily, HCTZ 25mg  daily Tolerating well, w/o complaints. Denies HA, chest pain, syncope, lightheaded, dizzy   BPH, LUTS Previously on Tamsulosin Doing well on Finasteride 5mg  Not seen Urologist Still has urinary frequency, urgency and nocturia - some improvement overall   Generalized Anxiety with Panic Admits some occasional anxiety type symptoms - related to not wanting to drive at times. Overall improved but not resolved. Still feels like he benefits from having someone with him for driving further distances.     Health Maintenance:   Colon CA Screening: Never had colonoscopy or screening. Currently asymptomatic. No known family history of colon CA. Completed Colonoscopy 02/10/20 - next due 2026 (5 years)   Prostate CA Screening: PSA elevated to 3.8 (12/2022), prior range 1.1 (2 yr ago), he is on Finasteride for past few years since 2021.   UTD COVID vaccine, updated new booster   Need Lung Screening, Former smoker 1ppd 20 years, quit 2016     10/20/2022    1:51 PM 10/02/2022    2:58 PM 09/28/2022    9:52 AM  Depression screen PHQ 2/9  Decreased Interest 0 0 0  Down, Depressed, Hopeless 0 0 0  PHQ - 2 Score 0  0 0  Altered sleeping  2 0  Tired, decreased energy  2 0  Change in appetite  0 0  Feeling bad or failure about yourself   0 0  Trouble concentrating  0 0  Moving slowly or fidgety/restless  1 0  Suicidal thoughts  0 0  PHQ-9 Score  5 0  Difficult doing work/chores  Somewhat difficult Not difficult at all    Past Medical History:  Diagnosis Date   Hypercholesteremia    Hypertension    Past Surgical History:  Procedure Laterality Date   COLONOSCOPY WITH PROPOFOL N/A 02/10/2020   Procedure: COLONOSCOPY WITH PROPOFOL;  Surgeon: Pasty Spillers, MD;  Location: [facility] ENDOSCOPY;  Service: Endoscopy;  Laterality: N/A;   NO PAST SURGERIES     Social History   Socioeconomic History   Marital status: Married    Spouse name: Not on file   Number of children: 1   Years of education: High School   Highest education level: High school graduate  Occupational History   Occupation: Self Employed - Horticulturist, commercial  Tobacco Use   Smoking status: Former    Packs/day: 1.00    Years: 20.00    Additional pack years: 0.00    Total pack years: 20.00    Types: Cigarettes    Quit date: 2016    Years since quitting: 8.4   Smokeless tobacco: Former  Building services engineer Use: Never used  Substance and Sexual Activity  Alcohol use: Yes    Alcohol/week: 3.0 standard drinks of alcohol    Types: 3 Cans of beer per week   Drug use: Never   Sexual activity: Not on file  Other Topics Concern   Not on file  Social History Narrative   Not on file   Social Determinants of Health   Financial Resource Strain: Low Risk  (10/24/2022)   Overall Financial Resource Strain (CARDIA)    Difficulty of Paying Living Expenses: Not very hard  Food Insecurity: No Food Insecurity (10/24/2022)   Hunger Vital Sign    Worried About Running Out of Food in the Last Year: Never true    Ran Out of Food in the Last Year: Never true  Transportation Needs: No Transportation Needs (10/24/2022)   PRAPARE - Therapist, art (Medical): No    Lack of Transportation (Non-Medical): No  Physical Activity: Sufficiently Active (10/24/2022)   Exercise Vital Sign    Days of Exercise per Week: 5 days    Minutes of Exercise per Session: 60 min  Stress: No Stress Concern Present (10/24/2022)   [employer] of Occupational Health - Occupational Stress Questionnaire    Feeling of Stress : Not at all  Social Connections: Socially Integrated (10/24/2022)   Social Connection and Isolation Panel [NHANES]    Frequency of Communication with Friends and Family: More than three times a week    Frequency of Social Gatherings with Friends and Family: More than three times a week    Attends Religious Services: More than 4 times per year    Active Member of [employer] or Organizations: Yes    Attends Engineer, structural: More than 4 times per year    Marital Status: Married  Catering manager Violence: Not At Risk (10/24/2022)   Humiliation, Afraid, Rape, and Kick questionnaire    Fear of Current or Ex-Partner: No    Emotionally Abused: No    Physically Abused: No    Sexually Abused: No   Family History  Problem Relation Age of Onset   Heart disease Mother    Pancreatic cancer Brother    Current Outpatient Medications on File Prior to Visit  Medication Sig   amLODipine (NORVASC) 10 MG tablet Take 1 tablet (10 mg total) by mouth daily.   BAYER ASPIRIN EC LOW DOSE 81 MG EC tablet    Blood Glucose Monitoring Suppl DEVI 1 each by Does not apply route in the morning, at noon, and at bedtime. May substitute to any manufacturer covered by patient's insurance.   Cholecalciferol (VITAMIN D HIGH POTENCY) 25 MCG (1000 UT) capsule Take 2,000 Units by mouth daily.   cyclobenzaprine (FLEXERIL) 10 MG tablet Take 1 tablet (10 mg total) by mouth 3 (three) times daily as needed for muscle spasms.   finasteride (PROSCAR) 5 MG tablet Take 1 tablet (5 mg total) by mouth daily.   losartan (COZAAR) 50 MG tablet Take 1  tablet (50 mg total) by mouth daily.   metFORMIN (GLUCOPHAGE-XR) 500 MG 24 hr tablet Take 2 tablets (1,000 mg total) by mouth daily with breakfast. (Patient taking differently: Take 500 mg by mouth daily with breakfast.)   naproxen (NAPROSYN) 500 MG tablet Take 1 tablet (500 mg total) by mouth 2 (two) times daily with a meal. For 2-4 weeks then as needed   Omega-3 Fatty Acids (FISH OIL) 1000 MG CAPS Take by mouth daily.   pravastatin (PRAVACHOL) 40 MG tablet Take 1 tablet (40 mg total)  by mouth daily.   No current facility-administered medications on file prior to visit.    Review of Systems  Constitutional:  Negative for activity change, appetite change, chills, diaphoresis, fatigue and fever.  HENT:  Negative for congestion and hearing loss.   Eyes:  Negative for visual disturbance.  Respiratory:  Negative for cough, chest tightness, shortness of breath and wheezing.   Cardiovascular:  Negative for chest pain, palpitations and leg swelling.  Gastrointestinal:  Negative for abdominal pain, constipation, diarrhea, nausea and vomiting.  Genitourinary:  Negative for dysuria, frequency and hematuria.  Musculoskeletal:  Negative for arthralgias and neck pain.  Skin:  Negative for rash.  Neurological:  Negative for dizziness, weakness, light-headedness, numbness and headaches.  Hematological:  Negative for adenopathy.  Psychiatric/Behavioral:  Negative for behavioral problems, dysphoric mood and sleep disturbance.    Per HPI unless specifically indicated above      Objective:    BP 134/84 (BP Location: Left Arm, Cuff Size: Normal)   Pulse 93   Temp (!) 96.4 F (35.8 C) (Temporal)   Wt 198 lb (89.8 kg)   SpO2 98%   BMI 29.24 kg/m   Wt Readings from Last 3 Encounters:  01/19/23 198 lb (89.8 kg)  10/20/22 196 lb (88.9 kg)  10/02/22 196 lb (88.9 kg)    Physical Exam Vitals and nursing note reviewed.  Constitutional:      General: He is not in acute distress.    Appearance: He is  well-developed. He is not diaphoretic.     Comments: Well-appearing, comfortable, cooperative  HENT:     Head: Normocephalic and atraumatic.  Eyes:     General:        Right eye: No discharge.        Left eye: No discharge.     Conjunctiva/sclera: Conjunctivae normal.     Pupils: Pupils are equal, round, and reactive to light.  Neck:     Thyroid: No thyromegaly.  Cardiovascular:     Rate and Rhythm: Normal rate and regular rhythm.     Pulses: Normal pulses.     Heart sounds: Normal heart sounds. No murmur heard. Pulmonary:     Effort: Pulmonary effort is normal. No respiratory distress.     Breath sounds: Normal breath sounds. No wheezing or rales.  Abdominal:     General: Bowel sounds are normal. There is no distension.     Palpations: Abdomen is soft. There is no mass.     Tenderness: There is no abdominal tenderness.  Musculoskeletal:        General: No tenderness. Normal range of motion.     Cervical back: Normal range of motion and neck supple.     Right lower leg: No edema.     Left lower leg: No edema.     Comments: Upper / Lower Extremities: - Normal muscle tone, strength bilateral upper extremities 5/5, lower extremities 5/5  Lymphadenopathy:     Cervical: No cervical adenopathy.  Skin:    General: Skin is warm and dry.     Findings: No erythema or rash.  Neurological:     Mental Status: He is alert and oriented to person, place, and time.     Comments: Distal sensation intact to light touch all extremities  Psychiatric:        Mood and Affect: Mood normal.        Behavior: Behavior normal.        Thought Content: Thought content normal.  Comments: Well groomed, good eye contact, normal speech and thoughts     Diabetic Foot Exam - Simple   Simple Foot Form Diabetic Foot exam was performed with the following findings: Yes 01/19/2023  3:53 PM  Visual Inspection No deformities, no ulcerations, no other skin breakdown bilaterally: Yes Sensation  Testing Intact to touch and monofilament testing bilaterally: Yes Pulse Check Posterior Tibialis and Dorsalis pulse intact bilaterally: Yes Comments    Recent Labs    09/29/22 1852 10/02/22 1517 01/18/23 0753  HGBA1C See Scanned report in [place] Link 13.4* 6.8*    Lipid Panel     Component Value Date/Time   CHOL 111 01/18/2023 0753   TRIG 65 01/18/2023 0753   HDL 80 01/18/2023 0753   CHOLHDL 1.4 01/18/2023 0753   LDLCALC 17 01/18/2023 0753     Results for orders placed or performed in visit on 01/18/23  HM DIABETES EYE EXAM  Result Value Ref Range   HM Diabetic Eye Exam No Retinopathy No Retinopathy      Assessment & Plan:   Problem List Items Addressed This Visit     BPH with obstruction/lower urinary tract symptoms   Relevant Orders   Ambulatory referral to Urology   Essential hypertension    Controlled HTN Limited outside readings - but some readings normal No known complications     Plan:  1. Continue current BP regimen - Amlodipine 10mg  daily, Losartan 50mg  daily 2. Encourage improved lifestyle - low sodium diet, regular exercise 3. Continue monitor BP outside office, bring readings to next visit, if persistently >140/90 or new symptoms notify office sooner      Generalized anxiety disorder with panic attacks   Hyperlipidemia associated with type 2 diabetes mellitus (HCC)    Controlled cholesterol on statin and lifestyle On Pravastatin  Encourage improved lifestyle - low carb/cholesterol, reduce portion size, continue improving regular exercise      Relevant Medications   OZEMPIC, 0.25 OR 0.5 MG/DOSE, 2 MG/3ML SOPN   Type 2 diabetes mellitus with other specified complication (HCC)    Dramatic improved A1c now 6.8 on GLP Concern with obesity, HTN  Plan:  Continue Ozempic 0.5mg  inj weekly, Metformin XR 500 daily WIll re order Ozempic, also he was approved for PAP now for ozempic  Encourage improved lifestyle - low carb, low sugar diet,  reduce portion size, continue improving regular exercise DM Foot Urine microalbumin DM Eye done 12/2022       Relevant Medications   OZEMPIC, 0.25 OR 0.5 MG/DOSE, 2 MG/3ML SOPN   Other Relevant Orders   Urine Microalbumin w/creat. ratio   Other Visit Diagnoses     Annual physical exam    -  Primary   Need for shingles vaccine       Relevant Medications   SHINGRIX injection   Former smoker       Relevant Orders   Ambulatory Referral Lung Cancer Screening [facility]   Screening for lung cancer       Relevant Orders   Ambulatory Referral Lung Cancer Screening [facility]   Elevated PSA, less than 10 ng/ml       Relevant Orders   Ambulatory referral to Urology       Updated Health Maintenance information Reviewed recent lab results with patient Encouraged improvement to lifestyle with diet and exercise Goal of weight loss  Elevated PSA BPH on Finasteride Referral to Urologist for evaluation of elevated PSA up to 3.8 (Prior 1.16, 2 yr ago) and on  finasteride, this means the PSA is actually higher.   Orders Placed This Encounter  Procedures   Urine Microalbumin w/creat. ratio   Ambulatory Referral Lung Cancer Screening [facility]    Referral Priority:   Routine    Referral Type:   Consultation    Referral Reason:   Specialty Services Required    Number of Visits Requested:   1   Ambulatory referral to Urology    Referral Priority:   Routine    Referral Type:   Consultation    Referral Reason:   Specialty Services Required    Requested Specialty:   Urology    Number of Visits Requested:   1      Meds ordered this encounter  Medications   SHINGRIX injection    Sig: Inject 0.5 mL into muscle for shingles vaccine. Repeat dose in 2-6 months.    Dispense:  0.5 mL    Refill:  1   OZEMPIC, 0.25 OR 0.5 MG/DOSE, 2 MG/3ML SOPN    Sig: Inject 0.5 mg into the skin once a week.    Dispense:  9 mL    Refill:  1    84 day supply      Follow up  plan: Return in about 4 months (around 05/21/2023) for keep apt as scheduled 4 month DM A1c.  Saralyn Pilar, DO [facility] 01/19/2023, 3:29 PM

## 2023-01-19 NOTE — Assessment & Plan Note (Signed)
Dramatic improved A1c now 6.8 on GLP Concern with obesity, HTN  Plan:  Continue Ozempic 0.5mg  inj weekly, Metformin XR 500 daily WIll re order Ozempic, also he was approved for PAP now for ozempic  Encourage improved lifestyle - low carb, low sugar diet, reduce portion size, continue improving regular exercise DM Foot Urine microalbumin DM Eye done 12/2022

## 2023-01-19 NOTE — Assessment & Plan Note (Signed)
Controlled HTN Limited outside readings - but some readings normal No known complications     Plan:  1. Continue current BP regimen - Amlodipine 10mg daily, Losartan 50mg daily 2. Encourage improved lifestyle - low sodium diet, regular exercise 3. Continue monitor BP outside office, bring readings to next visit, if persistently >140/90 or new symptoms notify office sooner 

## 2023-01-19 NOTE — Patient Instructions (Addendum)
Thank you for coming to the office today.  Referral to Urologist for evaluation of elevated PSA up to 3.8, and on finasteride, this means the PSA is actually higher.  [facility] -1st floor [address] [place],  [place]  [address] Phone: [phone]  Referral to [facility] Doctors for Lung Screening - CT Scan.  Ozempic has been approved  Received notification from [employer] regarding approval for OZEMPIC. Patient assistance APPROVED from 01/17/2023 to 07/24/2023.Marland Kitchen   Phone: [phone]   PLEASE BE ADVISED  PT WILL NEED TO CALL THE NUMBER ABOVE TO FIND OUT SHIPPING INFO.  Recent Labs    09/29/22 1852 10/02/22 1517 01/18/23 0753  HGBA1C See Scanned report in [place] Link 13.4* 6.8*   Other med refills in September  Please schedule a Follow-up Appointment to: Return in about 4 months (around 05/21/2023) for keep apt as scheduled 4 month DM A1c.  If you have any other questions or concerns, please feel free to call the office or send a message through MyChart. You may also schedule an earlier appointment if necessary.  Additionally, you may be receiving a survey about your experience at our office within a few days to 1 week by e-mail or mail. We value your feedback.  Saralyn Pilar, DO [facility], [place]

## 2023-01-20 LAB — MICROALBUMIN / CREATININE URINE RATIO
Creatinine, Urine: 80 mg/dL (ref 20–320)
Microalb Creat Ratio: 14 mg/g creat (ref <30)
Microalb, Ur: 1.1 mg/dL

## 2023-01-23 ENCOUNTER — Telehealth: Payer: Medicare HMO

## 2023-01-23 ENCOUNTER — Telehealth: Payer: Self-pay

## 2023-01-23 NOTE — Telephone Encounter (Signed)
   CCM RN Visit Note   01-23-2023 Name: Nicholas Black MRN: [id number]      DOB: January 20, 1956  Subjective: Nicholas Black is a 67 y.o. year old male who is a primary care patient of Dr. Saralyn Pilar. The patient was referred to the Chronic Care Management team for assistance with care management needs subsequent to provider initiation of CCM services and plan of care.      An unsuccessful telephone outreach was attempted today to contact the patient about Chronic Care Management needs.    Plan:The care management team will reach out to the patient again over the next 30 days.  Alto Denver RN, MSN, CCM RN Care Manager  Chronic Care Management Direct Number: [phone]

## 2023-02-09 ENCOUNTER — Ambulatory Visit: Payer: Medicare HMO | Admitting: Pharmacist

## 2023-02-09 DIAGNOSIS — I10 Essential (primary) hypertension: Secondary | ICD-10-CM

## 2023-02-09 DIAGNOSIS — E1169 Type 2 diabetes mellitus with other specified complication: Secondary | ICD-10-CM

## 2023-02-09 NOTE — Patient Instructions (Signed)
Goals Addressed             This Visit's Progress    Pharmacy Goals       Please start using a weekly pillbox to organize your medications/help with taking them consistently.  Our goal A1c is less than 7%. This corresponds with fasting sugars less than 130 and 2 hour after meal sugars less than 180. Please keep a log of your results when checking your blood sugar   Our goal bad cholesterol, or LDL, is less than 70 . This is why it is important to continue taking your pravastatin.  Check your blood pressure twice weekly, and any time you have concerning symptoms like headache, chest pain, dizziness, shortness of breath, or vision changes.   Our goal is less than 130/80.  To appropriately check your blood pressure, make sure you do the following:  1) Avoid caffeine, exercise, or tobacco products for 30 minutes before checking. Empty your bladder. 2) Sit with your back supported in a flat-backed chair. Rest your arm on something flat (arm of the chair, table, etc). 3) Sit still with your feet flat on the floor, resting, for at least 5 minutes.  4) Check your blood pressure. Take 1-2 readings.  5) Write down these readings and bring with you to any provider appointments.  Bring your home blood pressure machine with you to a provider's office for accuracy comparison at least once a year.   Make sure you take your blood pressure medications before you come to any office visit, even if you were asked to fast for labs.   Estelle Grumbles, PharmD, Patsy [place], CPP Clinical Pharmacist [facility] [phone]

## 2023-02-09 NOTE — Progress Notes (Signed)
02/09/2023 Name: Nicholas Black MRN: [id number] DOB: [birth year]  Chief Complaint  Patient presents with   Medication Assistance   Medication Adherence   Medication Management    Nicholas Black is a 67 y.o. year old male who presented for a telephone visit.   They were referred to the pharmacist by their PCP for assistance in managing diabetes, hypertension, hyperlipidemia, and medication access.      Subjective:   Care Team: Primary Care Provider: Smitty Cords, DO ; Next Scheduled Visit: 05/22/2023 Nurse Care Manager: Carma Lair, RN; Next Scheduled Visit: 02/19/2023 Urology: [name], MD; Next Scheduled Visit: 02/21/2023  Medication Access/Adherence  Current Pharmacy:  [facility] - [place], [place] - [address] [place] [address] Phone: [phone] Fax: [phone]   Patient reports affordability concerns with their medications: No  Patient reports access/transportation concerns to their pharmacy: No  Patient reports adherence concerns with their medications:  Yes     Reports not currently using weekly pillbox as has not yet picked this up   Reports picked up amlodipine prescription and restarted taking as discussed     Diabetes:   Current medications:  Metformin ER 500 mg daily Ozempic 0.5 mg weekly on Tuesdays.  Reports tolerating well    Medications tried in the past: metformin ER 1000 mg daily - reports was unable to tolerate due to headache   Current glucose readings: ranging 100s-110     Statin: pravastatin 40 mg daily   Current physical activity: working out in the gym x3 days/week and hiking once/week and riding bicycle some   Current medication access support: Patient enrolled in patient assistance for Ozempic from [employer] through 07/24/2023 - Review with patient process for ordering refills through program - Reports received shipment from program last Friday (picked up from  office)   Hypertension:   Current medications:  - amlodipine 10 mg daily - losartan 50 mg daily  Reports has access to upper arm blood pressure monitor at home, but denies monitoring home blood pressure recently   Patient denies hypotensive s/sx including dizziness, lightheadedness.    Current physical activity: working out in the gym x3 days/week and hiking once/week and riding bicycle some   Objective:  Lab Results  Component Value Date   HGBA1C 6.8 (H) 01/18/2023    Lab Results  Component Value Date   CREATININE 0.83 01/18/2023   BUN 16 01/18/2023   NA 144 01/18/2023   K 4.4 01/18/2023   CL 107 01/18/2023   CO2 28 01/18/2023    Lab Results  Component Value Date   CHOL 111 01/18/2023   HDL 80 01/18/2023   LDLCALC 17 01/18/2023   TRIG 65 01/18/2023   CHOLHDL 1.4 01/18/2023   BP Readings from Last 3 Encounters:  01/19/23 134/84  10/20/22 126/70  10/02/22 134/80   Pulse Readings from Last 3 Encounters:  01/19/23 93  10/20/22 78  10/02/22 86     Medications Reviewed Today     Reviewed by Smitty Cords, DO (Physician) on 01/19/23 at 2318  Med List Status: <None>   Medication Order Taking? Sig Documenting Provider Last Dose Status Informant  amLODipine (NORVASC) 10 MG tablet [id number] Yes Take 1 tablet (10 mg total) by mouth daily. Smitty Cords, DO Taking Active   BAYER ASPIRIN EC LOW DOSE 81 MG EC tablet [id number] Yes  [provider] Taking Active   Blood Glucose Monitoring Suppl DEVI [id number] Yes 1  each by Does not apply route in the morning, at noon, and at bedtime. May substitute to any manufacturer covered by patient's insurance. Phineas Semen, MD Taking Active   Cholecalciferol (VITAMIN D HIGH POTENCY) 25 MCG (1000 UT) capsule [id number] Yes Take 2,000 Units by mouth daily. [provider] Taking Active   cyclobenzaprine (FLEXERIL) 10 MG tablet [id number] Yes Take 1 tablet (10 mg total) by mouth 3 (three) times  daily as needed for muscle spasms. Smitty Cords, DO Taking Active   finasteride (PROSCAR) 5 MG tablet [id number] Yes Take 1 tablet (5 mg total) by mouth daily. Smitty Cords, DO Taking Active   losartan (COZAAR) 50 MG tablet [id number] Yes Take 1 tablet (50 mg total) by mouth daily. Smitty Cords, DO Taking Active   metFORMIN (GLUCOPHAGE-XR) 500 MG 24 hr tablet [id number] Yes Take 2 tablets (1,000 mg total) by mouth daily with breakfast.  Patient taking differently: Take 500 mg by mouth daily with breakfast.   Smitty Cords, DO Taking Active   naproxen (NAPROSYN) 500 MG tablet [id number] Yes Take 1 tablet (500 mg total) by mouth 2 (two) times daily with a meal. For 2-4 weeks then as needed Smitty Cords, DO Taking Active   Omega-3 Fatty Acids (FISH OIL) 1000 MG CAPS [id number] Yes Take by mouth daily. [provider] Taking Active   OZEMPIC, 0.25 OR 0.5 MG/DOSE, 2 MG/3ML SOPN [id number] Yes Inject 0.5 mg into the skin once a week. Smitty Cords, DO  Active   pravastatin (PRAVACHOL) 40 MG tablet [id number] Yes Take 1 tablet (40 mg total) by mouth daily. Smitty Cords, DO Taking Active   [facility] injection [id number] Yes Inject 0.5 mL into muscle for shingles vaccine. Repeat dose in 2-6 months. Smitty Cords, DO  Active               Assessment/Plan:   Counsel patient on importance of medication adherence. Again encourage patient to start using weekly pillbox as adherence aid     Diabetes: - Have reviewed long term cardiovascular and renal outcomes of uncontrolled blood sugar - Reviewed dietary modifications including importance of having regular well-balanced meals throughout the day, while controlling carbohydrate portion sizes             Encourage patient to continue to avoid consumption of sugary beverages             Encourage patient to increase consumption of non-starchy vegetables -  Recommend to check glucose and have record of results to review during upcoming appointments    Hypertension: - Reviewed long term cardiovascular and renal outcomes of uncontrolled blood pressure - Reviewed appropriate blood pressure monitoring technique and reviewed goal blood pressure. Recommended to check home blood pressure and heart rate and have record of results to review during upcoming appointments  - Have recommend to limit consumption of salt/sodium     Follow Up Plan: Clinical Pharmacist will follow up with patient by telephone on 03/12/2023 at 9:30 am   Estelle Grumbles, PharmD, P[place]y [place], CPP Clinical Pharmacist [facility] [phone]

## 2023-02-19 ENCOUNTER — Telehealth: Payer: Medicare HMO

## 2023-02-19 ENCOUNTER — Ambulatory Visit (INDEPENDENT_AMBULATORY_CARE_PROVIDER_SITE_OTHER): Payer: Medicare HMO

## 2023-02-19 ENCOUNTER — Telehealth: Payer: Self-pay

## 2023-02-19 DIAGNOSIS — I10 Essential (primary) hypertension: Secondary | ICD-10-CM

## 2023-02-19 DIAGNOSIS — E1169 Type 2 diabetes mellitus with other specified complication: Secondary | ICD-10-CM

## 2023-02-19 NOTE — Chronic Care Management (AMB) (Signed)
Chronic Care Management   CCM RN Visit Note  02/19/2023 Name: Nicholas Black MRN: [id number] DOB: [birth year]  Subjective: Nicholas Black is a 67 y.o. year old male who is a primary care patient of Smitty Cords, DO. The patient was referred to the Chronic Care Management team for assistance with care management needs subsequent to provider initiation of CCM services and plan of care.    Today's Visit:  Engaged with patient by telephone for follow up visit.     SDOH Interventions Today    Flowsheet Row Most Recent Value  SDOH Interventions   Health Literacy Interventions Intervention Not Indicated         Goals Addressed             This Visit's Progress    CCM Expected Outcome:  Monitor, Self-Manage and Reduce Symptoms of Diabetes       Current Barriers:  Knowledge Deficits related to importance of following the plan of care for effective management of DM Care Coordination needs related to cost constraints for medications, is working with the pharm D for assistance with getting Ozempic in a patient with DM Chronic Disease Management support and education needs related to effective management of DM Lab Results  Component Value Date   HGBA1C 6.8 (H) 01/18/2023  Previous A1c 13.4 in March of 2024   Planned Interventions: Provided education to patient about basic DM disease process. The patient has a good understanding of his DM. Is doing well on the current plan of care; Reviewed medications with patient and discussed importance of medication adherence. Has medications. Ozempic 0.5 mg is working well for managing his DM. Works with pharm D on a regular basis. Follow up in August with pharm D;        Reviewed prescribed diet with patient heart healthy/ADA diet, will attach information to MyChart for the patient for review and education. Discussed foods high in carbohydrates  and to eat in moderation foods like pasta, rice, potatoes. The patient states he has french  fries every now and then, but denies any acute changes in dietary habits.  The patient has educational information available and knows to call RNCM for additional information.  Counseled on importance of regular laboratory monitoring as prescribed. Has on a regular basis. Labs up to date;        Discussed plans with patient for ongoing care management follow up and provided patient with direct contact information for care management team;      Provided patient with written educational materials related to hypo and hyperglycemia and importance of correct treatment;       Reviewed scheduled/upcoming provider appointments including: 01-19-2023 at 3 pm with pcp;         Advised patient, providing education and rationale, to check cbg twice daily and when you have symptoms of low or high blood sugar and record        call provider for findings outside established parameters;       Referral made to pharmacy team for assistance with working with pharm D for assistance with getting Ozempic. The patient is taking Metformin and Ozempic and works with pharm D on a regular basis.  Review of patient status, including review of consultants reports, relevant laboratory and other test results, and medications completed;       Advised patient to discuss changes in his DM, questions or concerns with provider;      Screening for signs and symptoms of depression related to  chronic disease state;        Assessed social determinant of health barriers;        Review of online resources to help with DM management. Look at the website American Diabetes Association at https://www.diabetes.org. The site has free resources and meal planning information The patient is going to the gym sometimes 2 times a day for classes. He tries to go 3 days a week. He also rides his bike 4 miles to the [facility] and back. Takes spin classes and zumba. Is very active. Denies any new concerns.  Symptom Management: Take medications as prescribed    Attend all scheduled provider appointments Call provider office for new concerns or questions  call the Suicide and Crisis Lifeline: 988 call the [place] National Suicide Prevention Lifeline: 403-723-6017 or TTY: 989-581-4013 TTY (662)707-3408) to talk to a trained counselor call 1-800-273-TALK (toll free, 24 hour hotline) if experiencing a Mental Health or Behavioral Health Crisis  check feet daily for cuts, sores or redness trim toenails straight across manage portion size wash and dry feet carefully every day wear comfortable, cotton socks wear comfortable, well-fitting shoes  Follow Up Plan: Telephone follow up appointment with care management team member scheduled for: 04-09-2023 at 0900 am       CCM Expected Outcome:  Monitor, Self-Manage, and Reduce Symptoms of Hypertension       Current Barriers:  Chronic Disease Management support and education needs related to effective management of HTN BP Readings from Last 3 Encounters:  01/19/23 134/84  10/20/22 126/70  10/02/22 134/80     Planned Interventions: Evaluation of current treatment plan related to hypertension self management and patient's adherence to plan as established by provider. The patients blood pressures is stable, denies any new issues with HTN or heart health;   Provided education to patient re: stroke prevention, s/s of heart attack and stroke; Reviewed prescribed diet heart healthy/ADA diet. Compliant with heart healthy/ADA diet Reviewed medications with patient and discussed importance of compliance. Denies any issues with medications, works with pharm D on a regular basis;  Discussed plans with patient for ongoing care management follow up and provided patient with direct contact information for care management team; Advised patient, providing education and rationale, to monitor blood pressure daily and record, calling PCP for findings outside established parameters;  Reviewed scheduled/upcoming provider  appointments including: 05-22-2023 at 0840 am Advised patient to discuss changes in HTN or heart health with provider; Provided education on prescribed diet heart healthy/ADA diet. Will attach information in my Chart for the patient;  Discussed complications of poorly controlled blood pressure such as heart disease, stroke, circulatory complications, vision complications, kidney impairment, sexual dysfunction;  Screening for signs and symptoms of depression related to chronic disease state;  Assessed social determinant of health barriers;   Symptom Management: Take medications as prescribed   Attend all scheduled provider appointments Call provider office for new concerns or questions  call the Suicide and Crisis Lifeline: 988 call the [place] National Suicide Prevention Lifeline: 226-196-1186 or TTY: (202)718-6285 TTY (854) 431-1703) to talk to a trained counselor call 1-800-273-TALK (toll free, 24 hour hotline) if experiencing a Mental Health or Behavioral Health Crisis  check blood pressure weekly learn about high blood pressure call doctor for signs and symptoms of high blood pressure develop an action plan for high blood pressure keep all doctor appointments take medications for blood pressure exactly as prescribed report new symptoms to your doctor  Follow Up Plan: Telephone follow up appointment with care management  team member scheduled for: 04-09-2023 at 0900 am          Plan:Telephone follow up appointment with care management team member scheduled for:  04-09-2023 at 0900 am  Alto Denver RN, MSN, CCM RN Care Manager  Chronic Care Management Direct Number: [phone]

## 2023-02-19 NOTE — Patient Instructions (Signed)
Please call the care guide team at [phone] if you need to cancel or reschedule your appointment.   If you are experiencing a Mental Health or Behavioral Health Crisis or need someone to talk to, please call the Suicide and Crisis Lifeline: 988 call the [place] National Suicide Prevention Lifeline: 276-345-2564 or TTY: 819-758-7833 TTY 8010984940) to talk to a trained counselor call 1-800-273-TALK (toll free, 24 hour hotline)   Following is a copy of the CCM Program Consent:  CCM service includes personalized support from designated clinical staff supervised by the physician, including individualized plan of care and coordination with other care providers 24/7 contact phone numbers for assistance for urgent and routine care needs. Service will only be billed when office clinical staff spend 20 minutes or more in a month to coordinate care. Only one practitioner may furnish and bill the service in a calendar month. The patient may stop CCM services at amy time (effective at the end of the month) by phone call to the office staff. The patient will be responsible for cost sharing (co-pay) or up to 20% of the service fee (after annual deductible is met)  Following is a copy of your full provider care plan:   Goals Addressed             This Visit's Progress    CCM Expected Outcome:  Monitor, Self-Manage and Reduce Symptoms of Diabetes       Current Barriers:  Knowledge Deficits related to importance of following the plan of care for effective management of DM Care Coordination needs related to cost constraints for medications, is working with the pharm D for assistance with getting Ozempic in a patient with DM Chronic Disease Management support and education needs related to effective management of DM Lab Results  Component Value Date   HGBA1C 6.8 (H) 01/18/2023  Previous A1c 13.4 in March of 2024   Planned Interventions: Provided education to patient about basic DM disease  process. The patient has a good understanding of his DM. Is doing well on the current plan of care; Reviewed medications with patient and discussed importance of medication adherence. Has medications. Ozempic 0.5 mg is working well for managing his DM. Works with pharm D on a regular basis. Follow up in August with pharm D;        Reviewed prescribed diet with patient heart healthy/ADA diet, will attach information to MyChart for the patient for review and education. Discussed foods high in carbohydrates  and to eat in moderation foods like pasta, rice, potatoes. The patient states he has french fries every now and then, but denies any acute changes in dietary habits.  The patient has educational information available and knows to call RNCM for additional information.  Counseled on importance of regular laboratory monitoring as prescribed. Has on a regular basis. Labs up to date;        Discussed plans with patient for ongoing care management follow up and provided patient with direct contact information for care management team;      Provided patient with written educational materials related to hypo and hyperglycemia and importance of correct treatment;       Reviewed scheduled/upcoming provider appointments including: 01-19-2023 at 3 pm with pcp;         Advised patient, providing education and rationale, to check cbg twice daily and when you have symptoms of low or high blood sugar and record        call provider for findings outside established parameters;  Referral made to pharmacy team for assistance with working with pharm D for assistance with getting Ozempic. The patient is taking Metformin and Ozempic and works with pharm D on a regular basis.  Review of patient status, including review of consultants reports, relevant laboratory and other test results, and medications completed;       Advised patient to discuss changes in his DM, questions or concerns with provider;      Screening for  signs and symptoms of depression related to chronic disease state;        Assessed social determinant of health barriers;        Review of online resources to help with DM management. Look at the website American Diabetes Association at https://www.diabetes.org. The site has free resources and meal planning information The patient is going to the gym sometimes 2 times a day for classes. He tries to go 3 days a week. He also rides his bike 4 miles to the [facility] and back. Takes spin classes and zumba. Is very active. Denies any new concerns.  Symptom Management: Take medications as prescribed   Attend all scheduled provider appointments Call provider office for new concerns or questions  call the Suicide and Crisis Lifeline: 988 call the [place] National Suicide Prevention Lifeline: 5510432255 or TTY: 239 597 5277 TTY 779-308-8644) to talk to a trained counselor call 1-800-273-TALK (toll free, 24 hour hotline) if experiencing a Mental Health or Behavioral Health Crisis  check feet daily for cuts, sores or redness trim toenails straight across manage portion size wash and dry feet carefully every day wear comfortable, cotton socks wear comfortable, well-fitting shoes  Follow Up Plan: Telephone follow up appointment with care management team member scheduled for: 04-09-2023 at 0900 am       CCM Expected Outcome:  Monitor, Self-Manage, and Reduce Symptoms of Hypertension       Current Barriers:  Chronic Disease Management support and education needs related to effective management of HTN BP Readings from Last 3 Encounters:  01/19/23 134/84  10/20/22 126/70  10/02/22 134/80     Planned Interventions: Evaluation of current treatment plan related to hypertension self management and patient's adherence to plan as established by provider. The patients blood pressures is stable, denies any new issues with HTN or heart health;   Provided education to patient re: stroke prevention, s/s of heart  attack and stroke; Reviewed prescribed diet heart healthy/ADA diet. Compliant with heart healthy/ADA diet Reviewed medications with patient and discussed importance of compliance. Denies any issues with medications, works with pharm D on a regular basis;  Discussed plans with patient for ongoing care management follow up and provided patient with direct contact information for care management team; Advised patient, providing education and rationale, to monitor blood pressure daily and record, calling PCP for findings outside established parameters;  Reviewed scheduled/upcoming provider appointments including: 05-22-2023 at 0840 am Advised patient to discuss changes in HTN or heart health with provider; Provided education on prescribed diet heart healthy/ADA diet. Will attach information in my Chart for the patient;  Discussed complications of poorly controlled blood pressure such as heart disease, stroke, circulatory complications, vision complications, kidney impairment, sexual dysfunction;  Screening for signs and symptoms of depression related to chronic disease state;  Assessed social determinant of health barriers;   Symptom Management: Take medications as prescribed   Attend all scheduled provider appointments Call provider office for new concerns or questions  call the Suicide and Crisis Lifeline: 988 call the [place] National Suicide Prevention Lifeline:  (540) 058-6722 or TTY: 916-716-0622 TTY 941-639-0138) to talk to a trained counselor call 1-800-273-TALK (toll free, 24 hour hotline) if experiencing a Mental Health or Behavioral Health Crisis  check blood pressure weekly learn about high blood pressure call doctor for signs and symptoms of high blood pressure develop an action plan for high blood pressure keep all doctor appointments take medications for blood pressure exactly as prescribed report new symptoms to your doctor  Follow Up Plan: Telephone follow up appointment with  care management team member scheduled for: 04-09-2023 at 0900 am          Patient verbalizes understanding of instructions and care plan provided today and agrees to view in MyChart. Active MyChart status and patient understanding of how to access instructions and care plan via MyChart confirmed with patient.  Telephone follow up appointment with care management team member scheduled for: 04-09-2023 at 0900

## 2023-02-19 NOTE — Telephone Encounter (Signed)
   CCM RN Visit Note   02-19-2023 Name: Nicholas Black MRN: [id number]      DOB: 06-29-1956  Subjective: Nicholas Black is a 67 y.o. year old male who is a primary care patient of @PCP . The patient was referred to the Chronic Care Management team for assistance with care management needs subsequent to provider initiation of CCM services and plan of care.      An unsuccessful telephone outreach was attempted today to contact the patient about Chronic Care Management needs.  Was able to reach the patient on his cell phone and completed the call. See new encounter.  Plan:The care management team will reach out to the patient again over the next 60 days.  Nicholas Denver RN, MSN, CCM RN Care Manager  Chronic Care Management Direct Number: [phone]

## 2023-02-21 ENCOUNTER — Ambulatory Visit
Admission: RE | Admit: 2023-02-21 | Discharge: 2023-02-21 | Disposition: A | Discharge status: Home / Self Care | Payer: Medicare HMO | Source: Ambulatory Visit | Attending: Urology | Admitting: Urology

## 2023-02-21 ENCOUNTER — Ambulatory Visit
Admission: RE | Admit: 2023-02-21 | Discharge: 2023-02-21 | Disposition: A | Discharge status: Home / Self Care | Payer: Medicare HMO | Attending: Urology | Admitting: Urology

## 2023-02-21 ENCOUNTER — Encounter: Payer: Self-pay | Admitting: Urology

## 2023-02-21 ENCOUNTER — Ambulatory Visit: Payer: Medicare HMO | Admitting: Urology

## 2023-02-21 VITALS — BP 149/88 | HR 69 | Ht 68.0 in | Wt 192.0 lb

## 2023-02-21 DIAGNOSIS — I10 Essential (primary) hypertension: Secondary | ICD-10-CM

## 2023-02-21 DIAGNOSIS — R399 Unspecified symptoms and signs involving the genitourinary system: Secondary | ICD-10-CM

## 2023-02-21 DIAGNOSIS — N401 Enlarged prostate with lower urinary tract symptoms: Secondary | ICD-10-CM | POA: Diagnosis not present

## 2023-02-21 DIAGNOSIS — R972 Elevated prostate specific antigen [PSA]: Secondary | ICD-10-CM | POA: Diagnosis not present

## 2023-02-21 DIAGNOSIS — Z7984 Long term (current) use of oral hypoglycemic drugs: Secondary | ICD-10-CM

## 2023-02-21 DIAGNOSIS — Z87442 Personal history of urinary calculi: Secondary | ICD-10-CM | POA: Diagnosis not present

## 2023-02-21 DIAGNOSIS — R350 Frequency of micturition: Secondary | ICD-10-CM

## 2023-02-21 DIAGNOSIS — E1159 Type 2 diabetes mellitus with other circulatory complications: Secondary | ICD-10-CM | POA: Diagnosis not present

## 2023-02-21 DIAGNOSIS — N390 Urinary tract infection, site not specified: Secondary | ICD-10-CM | POA: Diagnosis not present

## 2023-02-21 LAB — URINALYSIS, COMPLETE
Bilirubin, UA: NEGATIVE
Glucose, UA: NEGATIVE
Ketones, UA: NEGATIVE
Nitrite, UA: NEGATIVE
RBC, UA: NEGATIVE
Specific Gravity, UA: >1.03 — ABNORMAL HIGH (ref 1.005–1.030)
Urobilinogen, Ur: 0.2 mg/dL (ref 0.2–1.0)
pH, UA: 5.5 (ref 5.0–7.5)

## 2023-02-21 LAB — MICROSCOPIC EXAMINATION
Epithelial Cells (non renal): >10 /hpf — AB (ref 0–10)
WBC, UA: >30 /hpf — AB (ref 0–5)

## 2023-02-21 LAB — BLADDER SCAN AMB NON-IMAGING: PVR: 0 WU

## 2023-02-21 NOTE — Progress Notes (Signed)
I,Dina M Abdulla,acting as a scribe for [name], MD.,have documented all relevant documentation on the behalf of [name], MD,as directed by  [name], MD while in the presence of [name], MD.  02/21/2023 2:31 PM   [name] 1956-06-02 [id number]  Referring provider: Smitty Cords, DO [address] [place] [address]  Chief Complaint  Patient presents with   New Patient (Initial Visit)    HPI: Nicholas Black is a 67 y.o. male referred for evaluation of BPH.  Patient states he thought he was here to be evaluated for chest problems and does not have any bothersome LUTS and was unsure why he was referred to urology. He was confused about his medications, stating that he is taking Tamsulosin and not taking Finasteride. Though on review of his medication dispense history, he has been on Finasteride since 2021. He has nocturia several times a night, but states he drinks a considerable amount of water, even up until bedtime and will drink water after getting up tp void at night. Denies dysuria or gross hematuria. No flank, abdominal, or pelvic pain. History of a stone approximately 20 years ago. PSA drawn 01/18/2023 was 3.8 (uncorrected) with a baseline uncorrected PSA in low 1 range.   PMH: Past Medical History:  Diagnosis Date   Hypercholesteremia    Hypertension     Surgical History: Past Surgical History:  Procedure Laterality Date   COLONOSCOPY WITH PROPOFOL N/A 02/10/2020   Procedure: COLONOSCOPY WITH PROPOFOL;  Surgeon: Pasty Spillers, MD;  Location: [facility] ENDOSCOPY;  Service: Endoscopy;  Laterality: N/A;   NO PAST SURGERIES      Home Medications:  Allergies as of 02/21/2023   No Known Allergies      Medication List        Accurate as of February 21, 2023  2:31 PM. If you have any questions, ask your nurse or doctor.          amLODipine 10 MG tablet Commonly known as: NORVASC Take 1 tablet (10 mg total) by  mouth daily.   Bayer Aspirin EC Low Dose 81 MG tablet Generic drug: aspirin EC   Blood Glucose Monitoring Suppl Devi 1 each by Does not apply route in the morning, at noon, and at bedtime. May substitute to any manufacturer covered by patient's insurance.   cyclobenzaprine 10 MG tablet Commonly known as: FLEXERIL Take 1 tablet (10 mg total) by mouth 3 (three) times daily as needed for muscle spasms.   finasteride 5 MG tablet Commonly known as: PROSCAR Take 1 tablet (5 mg total) by mouth daily.   Fish Oil 1000 MG Caps Take by mouth daily.   losartan 50 MG tablet Commonly known as: COZAAR Take 1 tablet (50 mg total) by mouth daily.   metFORMIN 500 MG 24 hr tablet Commonly known as: GLUCOPHAGE-XR Take 2 tablets (1,000 mg total) by mouth daily with breakfast. What changed: how much to take   naproxen 500 MG tablet Commonly known as: NAPROSYN Take 1 tablet (500 mg total) by mouth 2 (two) times daily with a meal. For 2-4 weeks then as needed   Ozempic (0.25 or 0.5 MG/DOSE) 2 MG/3ML Sopn Generic drug: Semaglutide(0.25 or 0.5MG /DOS) Inject 0.5 mg into the skin once a week.   pravastatin 40 MG tablet Commonly known as: PRAVACHOL Take 1 tablet (40 mg total) by mouth daily.   Shingrix injection Generic drug: Zoster Vaccine Adjuvanted Inject 0.5 mL into muscle  for shingles vaccine. Repeat dose in 2-6 months.   Vitamin D High Potency 25 MCG (1000 UT) capsule Generic drug: Cholecalciferol Take 2,000 Units by mouth daily.         Family History: Family History  Problem Relation Age of Onset   Heart disease Mother    Pancreatic cancer Brother     Social History:  reports that he quit smoking about 8 years ago. His smoking use included cigarettes. He started smoking about 28 years ago. He has a 20 pack-year smoking history. He has quit using smokeless tobacco. He reports current alcohol use of about 3.0 standard drinks of alcohol per week. He reports that he does not use  drugs.   Physical Exam: BP (!) 149/88   Pulse 69   Ht 5\' 8"  (1.727 m)   Wt 192 lb (87.1 kg)   BMI 29.19 kg/m   Constitutional:  Alert, No acute distress. HEENT: [place] AT Respiratory: Normal respiratory effort, no increased work of breathing. GI: Abdomen is soft, nontender, nondistended, no abdominal masses Skin: No rashes, bruises or suspicious lesions. Neurologic: Grossly intact, no focal deficits, moving all 4 extremities. Psychiatric: Normal mood and affect.   Laboratory Data:  Urinalysis Dipstick 1+protein 1+leukocytes Microscopy >30 WBC/>10 epis/calcium oxalate crystals.    Assessment & Plan:    1. BPH with LUTS Mild LUTS which are not bothersome.  2. Elevated PSA Most recent PSA corrected for Finasteride is 7.6. Urine today was significant pyuria and bacteriuria. Urine culture was ordered. Will await urine culture results prior to antibiotic therapy and repeat PSA after treatment, as this is a commong cause of PSA elevation.  3. Personal history of urinary calculi UA today with calcium oxalate crystals. KUB ordered.  I have reviewed the above documentation for accuracy and completeness, and I agree with the above.   [name], MD  [facility] [address] [place], [place] [address] [phone]

## 2023-02-22 ENCOUNTER — Encounter: Payer: Self-pay | Admitting: Urology

## 2023-02-26 ENCOUNTER — Other Ambulatory Visit: Payer: Self-pay | Admitting: *Deleted

## 2023-02-26 MED ORDER — CEFUROXIME AXETIL 500 MG PO TABS: 500.0000 mg | ORAL_TABLET | Freq: Two times a day (BID) | ORAL | 0 refills | Status: AC

## 2023-03-12 ENCOUNTER — Telehealth: Payer: Medicare HMO

## 2023-03-12 ENCOUNTER — Telehealth: Payer: Self-pay | Admitting: Pharmacist

## 2023-03-12 NOTE — Telephone Encounter (Signed)
   Outreach Note  03/12/2023 Name: DRELYN ADDY MRN: [id number] DOB: 04/13/1956  Referred by: Smitty Cords, DO Reason for referral : No chief complaint on file.   Was unable to reach patient via telephone today and unable to leave a message as no voicemail picks up   Follow Up Plan: Will collaborate with Care Guide to outreach to schedule follow up with me  Estelle Grumbles, PharmD, [facility] Clinical Pharmacist [facility] [phone]

## 2023-03-20 ENCOUNTER — Encounter: Payer: Self-pay | Admitting: *Deleted

## 2023-03-21 ENCOUNTER — Ambulatory Visit: Payer: Medicare HMO | Admitting: Internal Medicine

## 2023-03-21 ENCOUNTER — Encounter: Payer: Self-pay | Admitting: Internal Medicine

## 2023-03-21 DIAGNOSIS — M545 Low back pain, unspecified: Secondary | ICD-10-CM | POA: Diagnosis not present

## 2023-03-21 MED ORDER — CYCLOBENZAPRINE HCL 10 MG PO TABS: 10.0000 mg | ORAL_TABLET | Freq: Three times a day (TID) | ORAL | 0 refills | Status: DC | PRN

## 2023-03-21 MED ORDER — NAPROXEN 500 MG PO TABS: 500.0000 mg | ORAL_TABLET | Freq: Two times a day (BID) | ORAL | 0 refills | Status: DC

## 2023-03-21 MED ORDER — FINASTERIDE 5 MG PO TABS: 5.0000 mg | ORAL_TABLET | Freq: Every day | ORAL | 0 refills | Status: DC

## 2023-03-21 NOTE — Patient Instructions (Signed)

## 2023-03-21 NOTE — Progress Notes (Signed)
Subjective:    Patient ID: Nicholas Black, male    DOB: [birth year], 67 y.o.   MRN: [id number]  HPI  Patient presents to clinic today with complaint of back pain.  This started over the weekend after moving tables.  He describes the pain as stiff and numb.  The pain does not radiate down his legs.  He denies numbness, tingling or weakness of his lower extremities.  He denies loss of bowel or bladder control.  He has tried Tylenol OTC with minimal relief of symptoms.  He has a history of spondylosis of the lumbar region.  There is no imaging on file.  Review of Systems     Past Medical History:  Diagnosis Date   Hypercholesteremia    Hypertension     Current Outpatient Medications  Medication Sig Dispense Refill   amLODipine (NORVASC) 10 MG tablet Take 1 tablet (10 mg total) by mouth daily. 90 tablet 3   BAYER ASPIRIN EC LOW DOSE 81 MG EC tablet      Blood Glucose Monitoring Suppl DEVI 1 each by Does not apply route in the morning, at noon, and at bedtime. May substitute to any manufacturer covered by patient's insurance. 1 each 0   Cholecalciferol (VITAMIN D HIGH POTENCY) 25 MCG (1000 UT) capsule Take 2,000 Units by mouth daily. (Patient not taking: Reported on 02/21/2023)     cyclobenzaprine (FLEXERIL) 10 MG tablet Take 1 tablet (10 mg total) by mouth 3 (three) times daily as needed for muscle spasms. 30 tablet 2   finasteride (PROSCAR) 5 MG tablet Take 1 tablet (5 mg total) by mouth daily. (Patient not taking: Reported on 02/21/2023) 90 tablet 3   losartan (COZAAR) 50 MG tablet Take 1 tablet (50 mg total) by mouth daily. 90 tablet 3   metFORMIN (GLUCOPHAGE-XR) 500 MG 24 hr tablet Take 2 tablets (1,000 mg total) by mouth daily with breakfast. (Patient taking differently: Take 500 mg by mouth daily with breakfast.) 180 tablet 1   naproxen (NAPROSYN) 500 MG tablet Take 1 tablet (500 mg total) by mouth 2 (two) times daily with a meal. For 2-4 weeks then as needed 60 tablet 2   Omega-3 Fatty  Acids (FISH OIL) 1000 MG CAPS Take by mouth daily.     OZEMPIC, 0.25 OR 0.5 MG/DOSE, 2 MG/3ML SOPN Inject 0.5 mg into the skin once a week. 9 mL 1   pravastatin (PRAVACHOL) 40 MG tablet Take 1 tablet (40 mg total) by mouth daily. 90 tablet 3   SHINGRIX injection Inject 0.5 mL into muscle for shingles vaccine. Repeat dose in 2-6 months. 0.5 mL 1   No current facility-administered medications for this visit.    No Known Allergies  Family History  Problem Relation Age of Onset   Heart disease Mother    Pancreatic cancer Brother     Social History   Socioeconomic History   Marital status: Married    Spouse name: Not on file   Number of children: 1   Years of education: High School   Highest education level: High school graduate  Occupational History   Occupation: Self Employed - Horticulturist, commercial  Tobacco Use   Smoking status: Former    Current packs/day: 0.00    Average packs/day: 1 pack/day for 20.0 years (20.0 ttl pk-yrs)    Types: Cigarettes    Start date: 39    Quit date: 2016    Years since quitting: 8.6   Smokeless tobacco: Former  Advertising account planner  Vaping status: Never Used  Substance and Sexual Activity   Alcohol use: Yes    Alcohol/week: 3.0 standard drinks of alcohol    Types: 3 Cans of beer per week   Drug use: Never   Sexual activity: Not on file  Other Topics Concern   Not on file  Social History Narrative   Not on file   Social Determinants of Health   Financial Resource Strain: Low Risk  (10/24/2022)   Overall Financial Resource Strain (CARDIA)    Difficulty of Paying Living Expenses: Not very hard  Food Insecurity: No Food Insecurity (10/24/2022)   Hunger Vital Sign    Worried About Running Out of Food in the Last Year: Never true    Ran Out of Food in the Last Year: Never true  Transportation Needs: No Transportation Needs (10/24/2022)   PRAPARE - Administrator, Civil Service (Medical): No    Lack of Transportation (Non-Medical): No  Physical  Activity: Sufficiently Active (10/24/2022)   Exercise Vital Sign    Days of Exercise per Week: 5 days    Minutes of Exercise per Session: 60 min  Stress: No Stress Concern Present (10/24/2022)   [employer] of Occupational Health - Occupational Stress Questionnaire    Feeling of Stress : Not at all  Social Connections: Socially Integrated (10/24/2022)   Social Connection and Isolation Panel [NHANES]    Frequency of Communication with Friends and Family: More than three times a week    Frequency of Social Gatherings with Friends and Family: More than three times a week    Attends Religious Services: More than 4 times per year    Active Member of [employer] or Organizations: Yes    Attends Engineer, structural: More than 4 times per year    Marital Status: Married  Catering manager Violence: Not At Risk (10/24/2022)   Humiliation, Afraid, Rape, and Kick questionnaire    Fear of Current or Ex-Partner: No    Emotionally Abused: No    Physically Abused: No    Sexually Abused: No     Constitutional: Denies fever, malaise, fatigue, headache or abrupt weight changes.  Respiratory: Denies difficulty breathing, shortness of breath, cough or sputum production.   Cardiovascular: Denies chest pain, chest tightness, palpitations or swelling in the hands or feet.  Gastrointestinal: Denies abdominal pain, bloating, constipation, diarrhea or blood in the stool.  GU: Denies urgency, frequency, pain with urination, burning sensation, blood in urine, odor or discharge. Musculoskeletal: Patient reports low back pain.  Denies decrease in range of motion, difficulty with gait, or joint swelling.  Skin: Denies redness, rashes, lesions or ulcercations.  Neurological: Denies dizziness, difficulty with memory, difficulty with speech or problems with balance and coordination.  P No other specific complaints in a complete review of systems (except as listed in HPI above).  Objective:   Physical Exam BP (!)  142/86 (BP Location: Right Arm, Patient Position: Sitting, Cuff Size: Normal)   Pulse 92   Temp (!) 96.8 F (36 C) (Temporal)   Wt 197 lb (89.4 kg)   SpO2 97%   BMI 29.95 kg/m   Wt Readings from Last 3 Encounters:  02/21/23 192 lb (87.1 kg)  01/19/23 198 lb (89.8 kg)  10/20/22 196 lb (88.9 kg)    General: Appears his stated age, overweight, in NAD. Cardiovascular: Normal rate and rhythm.  Pulmonary/Chest: Normal effort and positive vesicular breath sounds. No respiratory distress. No wheezes, rales or ronchi noted.  Musculoskeletal: Normal  flexion and extension of the spine secondary to pain.  Normal rotation and lateral bending.  Pain with palpation over the lumbar spine.  Strength 5/5 BLE.  Able to stand on tiptoes and heels.  Gait slow and steady without device. Neurological: Alert and oriented.  Negative SLR bilaterally.    BMET    Component Value Date/Time   NA 144 01/18/2023 0753   NA 139 09/13/2013 1751   K 4.4 01/18/2023 0753   K 4.4 09/13/2013 1751   CL 107 01/18/2023 0753   CL 105 09/13/2013 1751   CO2 28 01/18/2023 0753   CO2 32 09/13/2013 1751   GLUCOSE 105 (H) 01/18/2023 0753   GLUCOSE 139 (H) 09/13/2013 1751   BUN 16 01/18/2023 0753   BUN 16 09/13/2013 1751   CREATININE 0.83 01/18/2023 0753   CALCIUM 9.7 01/18/2023 0753   CALCIUM 9.1 09/13/2013 1751   GFRNONAA >60 09/29/2022 1852   GFRNONAA 89 01/05/2021 0815   GFRAA 103 01/05/2021 0815    Lipid Panel     Component Value Date/Time   CHOL 111 01/18/2023 0753   TRIG 65 01/18/2023 0753   HDL 80 01/18/2023 0753   CHOLHDL 1.4 01/18/2023 0753   LDLCALC 17 01/18/2023 0753    CBC    Component Value Date/Time   WBC 5.3 01/18/2023 0753   RBC 4.58 01/18/2023 0753   HGB 13.3 01/18/2023 0753   HGB 14.8 09/13/2013 1751   HCT 42.0 01/18/2023 0753   HCT 44.7 09/13/2013 1751   PLT 247 01/18/2023 0753   PLT 193 09/13/2013 1751   MCV 91.7 01/18/2023 0753   MCV 94 09/13/2013 1751   MCH 29.0 01/18/2023  0753   MCHC 31.7 (L) 01/18/2023 0753   RDW 13.0 01/18/2023 0753   RDW 14.3 09/13/2013 1751   LYMPHSABS 2,152 01/18/2023 0753   LYMPHSABS 3.5 09/13/2013 1751   MONOABS 0.8 09/13/2013 1751   EOSABS 42 01/18/2023 0753   EOSABS 0.1 09/13/2013 1751   BASOSABS 32 01/18/2023 0753   BASOSABS 0.1 09/13/2013 1751    Hgb A1C Lab Results  Component Value Date   HGBA1C 6.8 (H) 01/18/2023            Assessment & Plan:   Acute low back pain:  Encouraged rest, stretching, heat and massage Rx for naproxen 500 mg twice daily x 7 days-consume with food Rx for Flexeril 10 mg 3 times daily as needed-sedation caution given If no improvement in symptoms, can consider x-ray lumbar spine  Follow-up with your PCP as previously scheduled Nicki Reaper, NP

## 2023-03-29 ENCOUNTER — Encounter: Payer: Self-pay | Admitting: Urology

## 2023-03-29 ENCOUNTER — Other Ambulatory Visit: Payer: Self-pay

## 2023-03-29 ENCOUNTER — Other Ambulatory Visit: Payer: Medicare HMO

## 2023-03-29 DIAGNOSIS — R350 Frequency of micturition: Secondary | ICD-10-CM

## 2023-03-29 DIAGNOSIS — R972 Elevated prostate specific antigen [PSA]: Secondary | ICD-10-CM

## 2023-04-05 ENCOUNTER — Ambulatory Visit (INDEPENDENT_AMBULATORY_CARE_PROVIDER_SITE_OTHER): Payer: Medicare HMO

## 2023-04-05 DIAGNOSIS — Z0000 Encounter for general adult medical examination without abnormal findings: Secondary | ICD-10-CM

## 2023-04-05 NOTE — Patient Instructions (Signed)
Nicholas Black , Thank you for taking time to come for your Medicare Wellness Visit. I appreciate your ongoing commitment to your health goals. Please review the following plan we discussed and let me know if I can assist you in the future.   Referrals/Orders/Follow-Ups/Clinician Recommendations: none  This is a list of the screening recommended for you and due dates:  Health Maintenance  Topic Date Due   Screening for Lung Cancer  Never done   Zoster (Shingles) Vaccine (1 of 2) Never done   Flu Shot  02/22/2023   Hemoglobin A1C  07/20/2023   Eye exam for diabetics  01/03/2024   Yearly kidney function blood test for diabetes  01/18/2024   Yearly kidney health urinalysis for diabetes  01/19/2024   Complete foot exam   01/19/2024   Medicare Annual Wellness Visit  04/04/2024   Colon Cancer Screening  02/09/2025   DTaP/Tdap/Td vaccine (2 - Td or Tdap) 06/14/2029   Pneumonia Vaccine  Completed   Hepatitis C Screening  Completed   HPV Vaccine  Aged Out   COVID-19 Vaccine  Discontinued    Advanced directives: (ACP Link)Information on Advanced Care Planning can be found at [facility] [place] Advance Health Care Directives Advance Health Care Directives ([email or web address]   Next Medicare Annual Wellness Visit scheduled for next year: Yes   04/10/24 @ 10:15 by phone

## 2023-04-05 NOTE — Progress Notes (Signed)
Subjective:   Nicholas Black is a 67 y.o. male who presents for an Initial Medicare Annual Wellness Visit.  Visit Complete: Virtual  I connected with  [name] on 04/05/23 by a audio enabled telemedicine application and verified that I am speaking with the correct person using two identifiers.  Patient Location: Home  Provider Location: Office/Clinic  I discussed the limitations of evaluation and management by telemedicine. The patient expressed understanding and agreed to proceed.  Vital Signs: Unable to obtain new vitals due to this being a telehealth visit.  Review of Systems     Cardiac Risk Factors include: advanced age (>82men, >65 women);diabetes mellitus;dyslipidemia;hypertension;male gender;obesity (BMI >30kg/m2)     Objective:    Today's Vitals   04/05/23 1204  PainSc: 5    There is no height or weight on file to calculate BMI.     04/05/2023   12:08 PM 09/29/2022    6:42 PM 02/10/2020   10:02 AM 06/15/2019    2:59 PM  Advanced Directives  Does Patient Have a Medical Advance Directive? No No Yes Yes  Type of Advance Directive    Living will  Would patient like information on creating a medical advance directive? No - Patient declined No - Patient declined      Current Medications (verified) Outpatient Encounter Medications as of 04/05/2023  Medication Sig   amLODipine (NORVASC) 10 MG tablet Take 1 tablet (10 mg total) by mouth daily.   BAYER ASPIRIN EC LOW DOSE 81 MG EC tablet    Blood Glucose Monitoring Suppl DEVI 1 each by Does not apply route in the morning, at noon, and at bedtime. May substitute to any manufacturer covered by patient's insurance.   cyclobenzaprine (FLEXERIL) 10 MG tablet Take 1 tablet (10 mg total) by mouth 3 (three) times daily as needed for muscle spasms.   finasteride (PROSCAR) 5 MG tablet Take 1 tablet (5 mg total) by mouth daily.   losartan (COZAAR) 50 MG tablet Take 1 tablet (50 mg total) by mouth daily.   metFORMIN  (GLUCOPHAGE-XR) 500 MG 24 hr tablet Take 2 tablets (1,000 mg total) by mouth daily with breakfast. (Patient taking differently: Take 500 mg by mouth daily with breakfast.)   naproxen (NAPROSYN) 500 MG tablet Take 1 tablet (500 mg total) by mouth 2 (two) times daily with a meal.   OZEMPIC, 0.25 OR 0.5 MG/DOSE, 2 MG/3ML SOPN Inject 0.5 mg into the skin once a week.   pravastatin (PRAVACHOL) 40 MG tablet Take 1 tablet (40 mg total) by mouth daily.   SHINGRIX injection Inject 0.5 mL into muscle for shingles vaccine. Repeat dose in 2-6 months.   Cholecalciferol (VITAMIN D HIGH POTENCY) 25 MCG (1000 UT) capsule Take 2,000 Units by mouth daily. (Patient not taking: Reported on 02/21/2023)   Omega-3 Fatty Acids (FISH OIL) 1000 MG CAPS Take by mouth daily. (Patient not taking: Reported on 04/05/2023)   No facility-administered encounter medications on file as of 04/05/2023.    Allergies (verified) Patient has no known allergies.   History: Past Medical History:  Diagnosis Date   Hypercholesteremia    Hypertension    Past Surgical History:  Procedure Laterality Date   COLONOSCOPY WITH PROPOFOL N/A 02/10/2020   Procedure: COLONOSCOPY WITH PROPOFOL;  Surgeon: Pasty Spillers, MD;  Location: [facility] ENDOSCOPY;  Service: Endoscopy;  Laterality: N/A;   NO PAST SURGERIES     Family History  Problem Relation Age of Onset   Heart disease Mother  Pancreatic cancer Brother    Social History   Socioeconomic History   Marital status: Married    Spouse name: Not on file   Number of children: 1   Years of education: High School   Highest education level: High school graduate  Occupational History   Occupation: Self Employed - Horticulturist, commercial  Tobacco Use   Smoking status: Former    Current packs/day: 0.00    Average packs/day: 1 pack/day for 20.0 years (20.0 ttl pk-yrs)    Types: Cigarettes    Start date: 24    Quit date: 2016    Years since quitting: 8.7   Smokeless tobacco: Former  Water quality scientist status: Never Used  Substance and Sexual Activity   Alcohol use: Yes    Alcohol/week: 3.0 standard drinks of alcohol    Types: 3 Cans of beer per week   Drug use: Never   Sexual activity: Not on file  Other Topics Concern   Not on file  Social History Narrative   Not on file   Social Determinants of Health   Financial Resource Strain: Low Risk  (04/05/2023)   Overall Financial Resource Strain (CARDIA)    Difficulty of Paying Living Expenses: Not very hard  Food Insecurity: No Food Insecurity (04/05/2023)   Hunger Vital Sign    Worried About Running Out of Food in the Last Year: Never true    Ran Out of Food in the Last Year: Never true  Transportation Needs: No Transportation Needs (04/05/2023)   PRAPARE - Administrator, Civil Service (Medical): No    Lack of Transportation (Non-Medical): No  Physical Activity: Insufficiently Active (04/05/2023)   Exercise Vital Sign    Days of Exercise per Week: 3 days    Minutes of Exercise per Session: 30 min  Stress: No Stress Concern Present (04/05/2023)   [employer] of Occupational Health - Occupational Stress Questionnaire    Feeling of Stress : Not at all  Social Connections: Moderately Integrated (04/05/2023)   Social Connection and Isolation Panel [NHANES]    Frequency of Communication with Friends and Family: More than three times a week    Frequency of Social Gatherings with Friends and Family: Three times a week    Attends Religious Services: Never    Active Member of Clubs or Organizations: Yes    Attends Engineer, structural: More than 4 times per year    Marital Status: Married    Tobacco Counseling Counseling given: Not Answered   Clinical Intake:  Pre-visit preparation completed: Yes  Pain : 0-10 Pain Score: 5  Pain Type: Chronic pain Pain Location: Back Pain Orientation: Lower Pain Descriptors / Indicators: Aching, Penetrating Pain Onset: More than a month ago Pain  Frequency: Constant Pain Relieving Factors: Tylenol  Pain Relieving Factors: Tylenol  Nutritional Status: BMI > 30  Obese Nutritional Risks: None Diabetes: Yes CBG done?: No Did pt. bring in CBG monitor from home?: No  How often do you need to have someone help you when you read instructions, pamphlets, or other written materials from your doctor or pharmacy?: 1 - Never  Interpreter Needed?: No  Information entered by :: Kennedy Bucker, LPN   Activities of Daily Living    04/05/2023   12:09 PM  In your present state of health, do you have any difficulty performing the following activities:  Hearing? 0  Vision? 0  Difficulty concentrating or making decisions? 0  Walking or climbing stairs? 0  Dressing or bathing? 0  Doing errands, shopping? 0  Preparing Food and eating ? N  Using the Toilet? N  In the past six months, have you accidently leaked urine? N  Do you have problems with loss of bowel control? N  Managing your Medications? N  Managing your Finances? N  Housekeeping or managing your Housekeeping? N    Patient Care Team: Smitty Cords, DO as PCP - General (Family Medicine) Marlowe Sax, RN as Case Manager (General Surgery) Ronney Asters Jackelyn Poling, RPH-CPP as Pharmacist  Indicate any recent Medical Services you may have received from other than [facility] providers in the past year (date may be approximate).     Assessment:   This is a routine wellness examination for [place].  Hearing/Vision screen Hearing Screening - Comments:: No aids Vision Screening - Comments:: Wears readers-    Goals Addressed             This Visit's Progress    DIET - EAT MORE FRUITS AND VEGETABLES         Depression Screen    04/05/2023   12:07 PM 10/20/2022    1:51 PM 10/02/2022    2:58 PM 09/28/2022    9:52 AM 03/28/2022   10:29 AM 01/17/2021   10:58 AM 07/14/2020    3:39 PM  PHQ 2/9 Scores  PHQ - 2 Score 0 0 0 0 1 3 0  PHQ- 9 Score 0  5 0 4 16     Fall Risk     04/05/2023   12:09 PM 10/24/2022   12:21 PM 10/02/2022    2:58 PM 09/28/2022    9:52 AM 03/28/2022   10:29 AM  Fall Risk   Falls in the past year? 1 0 0 0 0  Number falls in past yr: 1 0 0 0 0  Injury with Fall? 1 0 0 0 0  Risk for fall due to : History of fall(s) No Fall Risks No Fall Risks No Fall Risks No Fall Risks  Follow up Falls prevention discussed;Falls evaluation completed Falls evaluation completed Falls evaluation completed Falls evaluation completed Falls evaluation completed    MEDICARE RISK AT HOME: Medicare Risk at Home Any stairs in or around the home?: Yes If so, are there any without handrails?: No Home free of loose throw rugs in walkways, pet beds, electrical cords, etc?: Yes Adequate lighting in your home to reduce risk of falls?: Yes Life alert?: No Use of a cane, walker or w/c?: No Grab bars in the bathroom?: No Shower chair or bench in shower?: No Elevated toilet seat or a handicapped toilet?: No  TIMED UP AND GO:  Was the test performed? No    Cognitive Function:        04/05/2023   12:10 PM  6CIT Screen  What Year? 0 points  What month? 0 points  What time? 0 points  Count back from 20 0 points  Months in reverse 0 points  Repeat phrase 0 points  Total Score 0 points    Immunizations Immunization History  Administered Date(s) Administered   Fluad Quad(high Dose 65+) 07/01/2021   Influenza,inj,Quad PF,6+ Mos 04/29/2018   Influenza-Unspecified 06/17/2019, 06/30/2020   PFIZER(Purple Top)SARS-COV-2 Vaccination 09/16/2019, 10/07/2019, 06/13/2020, 01/03/2021   PNEUMOCOCCAL CONJUGATE-20 03/28/2022   Pneumococcal Conjugate-13 01/15/2020   Tdap 06/15/2019    TDAP status: Up to date  Flu Vaccine status: Declined, Education has been provided regarding the importance of this vaccine but patient still declined. Advised may  receive this vaccine at local pharmacy or Health Dept. Aware to provide a copy of the vaccination record if obtained from local  pharmacy or Health Dept. Verbalized acceptance and understanding.  Pneumococcal vaccine status: Up to date  Covid-19 vaccine status: Completed vaccines  Qualifies for Shingles Vaccine? No   Zostavax completed No   Shingrix Completed?: No.    Education has been provided regarding the importance of this vaccine. Patient has been advised to call insurance company to determine out of pocket expense if they have not yet received this vaccine. Advised may also receive vaccine at local pharmacy or Health Dept. Verbalized acceptance and understanding.  Screening Tests Health Maintenance  Topic Date Due   Lung Cancer Screening  Never done   Zoster Vaccines- Shingrix (1 of 2) Never done   INFLUENZA VACCINE  02/22/2023   HEMOGLOBIN A1C  07/20/2023   OPHTHALMOLOGY EXAM  01/03/2024   Diabetic kidney evaluation - eGFR measurement  01/18/2024   Diabetic kidney evaluation - Urine ACR  01/19/2024   FOOT EXAM  01/19/2024   Medicare Annual Wellness (AWV)  04/04/2024   Colonoscopy  02/09/2025   DTaP/Tdap/Td (2 - Td or Tdap) 06/14/2029   Pneumonia Vaccine 36+ Years old  Completed   Hepatitis C Screening  Completed   HPV VACCINES  Aged Out   COVID-19 Vaccine  Discontinued    Health Maintenance  Health Maintenance Due  Topic Date Due   Lung Cancer Screening  Never done   Zoster Vaccines- Shingrix (1 of 2) Never done   INFLUENZA VACCINE  02/22/2023    Colorectal cancer screening: Type of screening: Colonoscopy. Completed 02/10/20. Repeat every 5 years  Lung Cancer Screening: (Low Dose CT Chest recommended if Age 18-80 years, 20 pack-year currently smoking OR have quit w/in 15years.) does not qualify.    Additional Screening:  Hepatitis C Screening: does qualify; Completed 04/12/18  Vision Screening: Recommended annual ophthalmology exams for early detection of glaucoma and other disorders of the eye. Is the patient up to date with their annual eye exam?  No  Who is the provider or what is the  name of the office in which the patient attends annual eye exams? No one If pt is not established with a provider, would they like to be referred to a provider to establish care? No .   Dental Screening: Recommended annual dental exams for proper oral hygiene  Diabetic Foot Exam: Diabetic Foot Exam: Completed 01/19/23  Community Resource Referral / Chronic Care Management: CRR required this visit?  No   CCM required this visit?  No    Plan:     I have personally reviewed and noted the following in the patient's chart:   Medical and social history Use of alcohol, tobacco or illicit drugs  Current medications and supplements including opioid prescriptions. Patient is not currently taking opioid prescriptions. Functional ability and status Nutritional status Physical activity Advanced directives List of other physicians Hospitalizations, surgeries, and ER visits in previous 12 months Vitals Screenings to include cognitive, depression, and falls Referrals and appointments  In addition, I have reviewed and discussed with patient certain preventive protocols, quality metrics, and best practice recommendations. A written personalized care plan for preventive services as well as general preventive health recommendations were provided to patient.     Hal Hope, LPN   1/61/0960   After Visit Summary: (MyChart) Due to this being a telephonic visit, the after visit summary with patients personalized plan was offered to patient via  MyChart   Nurse Notes: none

## 2023-04-08 ENCOUNTER — Telehealth: Payer: Self-pay | Admitting: Urology

## 2023-04-08 NOTE — Telephone Encounter (Signed)
Patient missed follow-up lab visit for UA and PSA 03/29/2023.  Please reschedule

## 2023-04-09 ENCOUNTER — Other Ambulatory Visit: Payer: Self-pay

## 2023-04-09 ENCOUNTER — Other Ambulatory Visit: Payer: Medicare HMO

## 2023-04-09 NOTE — Telephone Encounter (Signed)
Notified patient as instructed, patient pleased. Discussed follow-up appointments, patient agrees, 04/10/2023

## 2023-04-09 NOTE — Patient Outreach (Signed)
Care Management   Visit Note  04/09/2023 Name: Nicholas Black MRN: [id number] DOB: 1956-06-19  Subjective: Nicholas Black is a 67 y.o. year old male who is a primary care patient of Smitty Cords, DO. The Care Management team was consulted for assistance.      Engaged with patient spoke with patient by telephone.    Goals Addressed             This Visit's Progress    RNCM Care Management Expected Outcome:  Monitor, Self-Manage and Reduce Symptoms of Diabetes       Current Barriers:  Knowledge Deficits related to importance of following the plan of care for effective management of DM Care Coordination needs related to cost constraints for medications, is working with the pharm D for assistance with getting Ozempic in a patient with DM Chronic Disease Management support and education needs related to effective management of DM Lab Results  Component Value Date   HGBA1C 6.8 (H) 01/18/2023  Previous A1c 13.4 in March of 2024   Planned Interventions: Provided education to patient about basic DM disease process. The patient has a good understanding of his DM. Is doing well on the current plan of care. The patient is happy that his blood sugars are doing well; Reviewed medications with patient and discussed importance of medication adherence. Has medications. Ozempic 0.5 mg is working well for managing his DM. Works with pharm D on a regular basis. Follows up with the pharm D on a regular basis.;        Reviewed prescribed diet with patient heart healthy/ADA diet, will attach information to MyChart for the patient for review and education. Discussed foods high in carbohydrates  and to eat in moderation foods like pasta, rice, potatoes. The patient states he has french fries every now and then, but denies any acute changes in dietary habits.  The patient has educational information available and knows to call RNCM for additional information.  Counseled on importance of regular  laboratory monitoring as prescribed. Has on a regular basis. Labs up to date;        Discussed plans with patient for ongoing care management follow up and provided patient with direct contact information for care management team;      Provided patient with written educational materials related to hypo and hyperglycemia and importance of correct treatment;       Reviewed scheduled/upcoming provider appointments including: 01-19-2023 at 3 pm with pcp;         Advised patient, providing education and rationale, to check cbg twice daily and when you have symptoms of low or high blood sugar and record        call provider for findings outside established parameters;       Referral made to pharmacy team for assistance with working with pharm D for assistance with getting Ozempic. The patient is taking Metformin and Ozempic and works with pharm D on a regular basis.  Review of patient status, including review of consultants reports, relevant laboratory and other test results, and medications completed;       Advised patient to discuss changes in his DM, questions or concerns with provider;      Screening for signs and symptoms of depression related to chronic disease state;        Assessed social determinant of health barriers;        Review of online resources to help with DM management. Look at the website American Diabetes Association  at https://www.diabetes.org. The site has free resources and meal planning information The patient is going to the gym sometimes 2 times a day for classes. He tries to go 3 days a week. He also rides his bike 4 miles to the [facility] and back. Takes spin classes and zumba. Is very active.  Continues to do spin classes and had just finished a spin class at the time of the call. Denies any new concerns.  Symptom Management: Take medications as prescribed   Attend all scheduled provider appointments Call provider office for new concerns or questions  call the Suicide and Crisis  Lifeline: 988 call the [place] National Suicide Prevention Lifeline: 603-034-5119 or TTY: 206-330-7922 TTY 308 849 3233) to talk to a trained counselor call 1-800-273-TALK (toll free, 24 hour hotline) if experiencing a Mental Health or Behavioral Health Crisis  check feet daily for cuts, sores or redness trim toenails straight across manage portion size wash and dry feet carefully every day wear comfortable, cotton socks wear comfortable, well-fitting shoes  Follow Up Plan: Telephone follow up appointment with care management team member scheduled for: 06-11-2023 at 0900 am       RNCM Care Management Expected Outcome:  Monitor, Self-Manage, and Reduce Symptoms of Hypertension       Current Barriers:  Chronic Disease Management support and education needs related to effective management of HTN BP Readings from Last 3 Encounters:  03/21/23 (!) 142/86  02/21/23 (!) 149/88  01/19/23 134/84     Planned Interventions: Evaluation of current treatment plan related to hypertension self management and patient's adherence to plan as established by provider. The patients blood pressures a little elevated recently but has been dealing with some back pain and discomfort. He states it is better. He denies any acute changes in his HTN or Heart health.  Provided education to patient re: stroke prevention, s/s of heart attack and stroke; Reviewed prescribed diet heart healthy/ADA diet. Compliant with heart healthy/ADA diet Reviewed medications with patient and discussed importance of compliance. Denies any issues with medications, works with pharm D on a regular basis;  Discussed plans with patient for ongoing care management follow up and provided patient with direct contact information for care management team; Advised patient, providing education and rationale, to monitor blood pressure daily and record, calling PCP for findings outside established parameters. The patient is not monitoring his blood  pressures but feels his blood pressures are good. Education provided. The patient denies any headaches or other sx and sx of headache. He states he gets a  headache sometimes, education provided that this may be a sign of his blood pressures being elevated. Will continue to monitor. ;  Reviewed scheduled/upcoming provider appointments including: 05-22-2023 at 0840 am Advised patient to discuss changes in HTN or heart health with provider; Provided education on prescribed diet heart healthy/ADA diet. Will attach information in my Chart for the patient;  Discussed complications of poorly controlled blood pressure such as heart disease, stroke, circulatory complications, vision complications, kidney impairment, sexual dysfunction;  Screening for signs and symptoms of depression related to chronic disease state;  Assessed social determinant of health barriers;   Symptom Management: Take medications as prescribed   Attend all scheduled provider appointments Call provider office for new concerns or questions  call the Suicide and Crisis Lifeline: 988 call the [place] National Suicide Prevention Lifeline: 413-261-7936 or TTY: 218-150-6244 TTY 970 544 2182) to talk to a trained counselor call 1-800-273-TALK (toll free, 24 hour hotline) if experiencing a Mental Health or Behavioral Health  Crisis  check blood pressure weekly learn about high blood pressure call doctor for signs and symptoms of high blood pressure develop an action plan for high blood pressure keep all doctor appointments take medications for blood pressure exactly as prescribed report new symptoms to your doctor  Follow Up Plan: Telephone follow up appointment with care management team member scheduled for: 06-11-2023 at 0900 am           Consent to Services:  Patient was given information about care management services, agreed to services, and gave verbal consent to participate.   Plan: Telephone follow up appointment with  care management team member scheduled for: 06-11-2023 at 0900 am  Alto Denver RN, MSN, CCM RN Care Manager  [facility]  Ambulatory Care Management  Direct Number: [phone]

## 2023-04-09 NOTE — Patient Instructions (Signed)
Visit Information  Thank you for taking time to visit with me today. Please don't hesitate to contact me if I can be of assistance to you before our next scheduled telephone appointment.  Following are the goals we discussed today:   Goals Addressed             This Visit's Progress    RNCM Care Management Expected Outcome:  Monitor, Self-Manage and Reduce Symptoms of Diabetes       Current Barriers:  Knowledge Deficits related to importance of following the plan of care for effective management of DM Care Coordination needs related to cost constraints for medications, is working with the pharm D for assistance with getting Ozempic in a patient with DM Chronic Disease Management support and education needs related to effective management of DM Lab Results  Component Value Date   HGBA1C 6.8 (H) 01/18/2023  Previous A1c 13.4 in March of 2024   Planned Interventions: Provided education to patient about basic DM disease process. The patient has a good understanding of his DM. Is doing well on the current plan of care. The patient is happy that his blood sugars are doing well; Reviewed medications with patient and discussed importance of medication adherence. Has medications. Ozempic 0.5 mg is working well for managing his DM. Works with pharm D on a regular basis. Follows up with the pharm D on a regular basis.;        Reviewed prescribed diet with patient heart healthy/ADA diet, will attach information to MyChart for the patient for review and education. Discussed foods high in carbohydrates  and to eat in moderation foods like pasta, rice, potatoes. The patient states he has french fries every now and then, but denies any acute changes in dietary habits.  The patient has educational information available and knows to call RNCM for additional information.  Counseled on importance of regular laboratory monitoring as prescribed. Has on a regular basis. Labs up to date;        Discussed plans  with patient for ongoing care management follow up and provided patient with direct contact information for care management team;      Provided patient with written educational materials related to hypo and hyperglycemia and importance of correct treatment;       Reviewed scheduled/upcoming provider appointments including: 01-19-2023 at 3 pm with pcp;         Advised patient, providing education and rationale, to check cbg twice daily and when you have symptoms of low or high blood sugar and record        call provider for findings outside established parameters;       Referral made to pharmacy team for assistance with working with pharm D for assistance with getting Ozempic. The patient is taking Metformin and Ozempic and works with pharm D on a regular basis.  Review of patient status, including review of consultants reports, relevant laboratory and other test results, and medications completed;       Advised patient to discuss changes in his DM, questions or concerns with provider;      Screening for signs and symptoms of depression related to chronic disease state;        Assessed social determinant of health barriers;        Review of online resources to help with DM management. Look at the website American Diabetes Association at https://www.diabetes.org. The site has free resources and meal planning information The patient is going to the gym sometimes 2  times a day for classes. He tries to go 3 days a week. He also rides his bike 4 miles to the [employer] and back. Takes spin classes and zumba. Is very active.  Continues to do spin classes and had just finished a spin class at the time of the call. Denies any new concerns.  Symptom Management: Take medications as prescribed   Attend all scheduled provider appointments Call provider office for new concerns or questions  call the Suicide and Crisis Lifeline: 988 call the [place] National Suicide Prevention Lifeline: 952-190-4704 or TTY: (484)353-5688  TTY 430-097-2484) to talk to a trained counselor call 1-800-273-TALK (toll free, 24 hour hotline) if experiencing a Mental Health or Behavioral Health Crisis  check feet daily for cuts, sores or redness trim toenails straight across manage portion size wash and dry feet carefully every day wear comfortable, cotton socks wear comfortable, well-fitting shoes  Follow Up Plan: Telephone follow up appointment with care management team member scheduled for: 06-11-2023 at 0900 am       RNCM Care Management Expected Outcome:  Monitor, Self-Manage, and Reduce Symptoms of Hypertension       Current Barriers:  Chronic Disease Management support and education needs related to effective management of HTN BP Readings from Last 3 Encounters:  03/21/23 (!) 142/86  02/21/23 (!) 149/88  01/19/23 134/84     Planned Interventions: Evaluation of current treatment plan related to hypertension self management and patient's adherence to plan as established by provider. The patients blood pressures a little elevated recently but has been dealing with some back pain and discomfort. He states it is better. He denies any acute changes in his HTN or Heart health.  Provided education to patient re: stroke prevention, s/s of heart attack and stroke; Reviewed prescribed diet heart healthy/ADA diet. Compliant with heart healthy/ADA diet Reviewed medications with patient and discussed importance of compliance. Denies any issues with medications, works with pharm D on a regular basis;  Discussed plans with patient for ongoing care management follow up and provided patient with direct contact information for care management team; Advised patient, providing education and rationale, to monitor blood pressure daily and record, calling PCP for findings outside established parameters. The patient is not monitoring his blood pressures but feels his blood pressures are good. Education provided. The patient denies any headaches  or other sx and sx of headache. He states he gets a  headache sometimes, education provided that this may be a sign of his blood pressures being elevated. Will continue to monitor. ;  Reviewed scheduled/upcoming provider appointments including: 05-22-2023 at 0840 am Advised patient to discuss changes in HTN or heart health with provider; Provided education on prescribed diet heart healthy/ADA diet. Will attach information in my Chart for the patient;  Discussed complications of poorly controlled blood pressure such as heart disease, stroke, circulatory complications, vision complications, kidney impairment, sexual dysfunction;  Screening for signs and symptoms of depression related to chronic disease state;  Assessed social determinant of health barriers;   Symptom Management: Take medications as prescribed   Attend all scheduled provider appointments Call provider office for new concerns or questions  call the Suicide and Crisis Lifeline: 988 call the [place] National Suicide Prevention Lifeline: 380 143 2921 or TTY: 870-369-1990 TTY 716-875-0988) to talk to a trained counselor call 1-800-273-TALK (toll free, 24 hour hotline) if experiencing a Mental Health or Behavioral Health Crisis  check blood pressure weekly learn about high blood pressure call doctor for signs and symptoms of high blood  pressure develop an action plan for high blood pressure keep all doctor appointments take medications for blood pressure exactly as prescribed report new symptoms to your doctor  Follow Up Plan: Telephone follow up appointment with care management team member scheduled for: 06-11-2023 at 0900 am           Our next appointment is by telephone on 06-11-2023 at 0900 am  Please call the care guide team at [phone] if you need to cancel or reschedule your appointment.   If you are experiencing a Mental Health or Behavioral Health Crisis or need someone to talk to, please call the Suicide and  Crisis Lifeline: 988 call the [place] National Suicide Prevention Lifeline: 910-188-5450 or TTY: 629 610 1323 TTY (762) 264-8640) to talk to a trained counselor call 1-800-273-TALK (toll free, 24 hour hotline)   Patient verbalizes understanding of instructions and care plan provided today and agrees to view in MyChart. Active MyChart status and patient understanding of how to access instructions and care plan via MyChart confirmed with patient.     Telephone follow up appointment with care management team member scheduled for: 06-11-2023 at 0900 am  Alto Denver RN, MSN, CCM RN Care Manager  [facility]  Ambulatory Care Management  Direct Number: [phone]

## 2023-04-10 ENCOUNTER — Other Ambulatory Visit: Payer: Medicare HMO

## 2023-04-10 DIAGNOSIS — R350 Frequency of micturition: Secondary | ICD-10-CM | POA: Diagnosis not present

## 2023-04-10 DIAGNOSIS — R972 Elevated prostate specific antigen [PSA]: Secondary | ICD-10-CM | POA: Diagnosis not present

## 2023-04-10 LAB — MICROSCOPIC EXAMINATION

## 2023-04-10 LAB — URINALYSIS, COMPLETE
Bilirubin, UA: NEGATIVE
Glucose, UA: NEGATIVE
Ketones, UA: NEGATIVE
Nitrite, UA: NEGATIVE
Protein,UA: NEGATIVE
RBC, UA: NEGATIVE
Specific Gravity, UA: 1.025 (ref 1.005–1.030)
Urobilinogen, Ur: 0.2 mg/dL (ref 0.2–1.0)
pH, UA: 5.5 (ref 5.0–7.5)

## 2023-04-13 ENCOUNTER — Telehealth: Payer: Self-pay | Admitting: Pharmacist

## 2023-04-13 ENCOUNTER — Telehealth: Payer: Medicare HMO

## 2023-04-13 NOTE — Telephone Encounter (Signed)
Outreach Note  04/13/2023 Name: DONOVAN LOGEMAN MRN: [id number] DOB: 06/28/1956  Referred by: Smitty Cords, DO  Reach patient by telephone today, but he reports that now is not a good time to talk as he is in a meeting. Patient requests call back to reschedule  Follow Up Plan: Will collaborate with Care Guide to outreach to schedule follow up with me  Estelle Grumbles, PharmD, Patsy Baltimore, CPP Clinical Pharmacist [facility] [phone]

## 2023-04-23 ENCOUNTER — Telehealth: Payer: Self-pay

## 2023-04-23 ENCOUNTER — Ambulatory Visit: Payer: Medicare HMO | Admitting: Pharmacist

## 2023-04-23 DIAGNOSIS — E1169 Type 2 diabetes mellitus with other specified complication: Secondary | ICD-10-CM

## 2023-04-23 DIAGNOSIS — I10 Essential (primary) hypertension: Secondary | ICD-10-CM

## 2023-04-23 MED ORDER — AMLODIPINE BESYLATE 10 MG PO TABS: 10.0000 mg | ORAL_TABLET | Freq: Every day | ORAL | 0 refills | Status: DC

## 2023-04-23 MED ORDER — METFORMIN HCL ER 500 MG PO TB24: 500.0000 mg | ORAL_TABLET | Freq: Every day | ORAL | 0 refills | Status: DC

## 2023-04-23 NOTE — Progress Notes (Signed)
Care Coordination Note  04/23/2023 Name: [name] MRN: [id number] DOB: [birth year]  Nicholas Black is a 67 y.o. year old male who is a primary care patient of Smitty Cords, DO and is actively engaged with the Chronic Care Management team. I reached out to [name] by phone today to assist with re-scheduling a follow up visit with the Pharmacist  Follow up plan: Unsuccessful telephone outreach attempt made. A HIPAA compliant phone message was left for the patient providing contact information and requesting a return call.  If patient returns call to provider office, please advise to call CCM Care Guide Penne Lash  at [phone]  Penne Lash, RMA Care Guide [facility]  [place], [place] [address] Direct Dial: [phone] [email or web address]

## 2023-04-23 NOTE — Patient Instructions (Signed)
Goals Addressed             This Visit's Progress    Pharmacy Goals       Please start using a weekly pillbox to organize your medications/help with taking them consistently.  Our goal A1c is less than 7%. This corresponds with fasting sugars less than 130 and 2 hour after meal sugars less than 180. Please keep a log of your results when checking your blood sugar   Our goal bad cholesterol, or LDL, is less than 70 . This is why it is important to continue taking your pravastatin.  Check your blood pressure twice weekly, and any time you have concerning symptoms like headache, chest pain, dizziness, shortness of breath, or vision changes.   Our goal is less than 130/80.  To appropriately check your blood pressure, make sure you do the following:  1) Avoid caffeine, exercise, or tobacco products for 30 minutes before checking. Empty your bladder. 2) Sit with your back supported in a flat-backed chair. Rest your arm on something flat (arm of the chair, table, etc). 3) Sit still with your feet flat on the floor, resting, for at least 5 minutes.  4) Check your blood pressure. Take 1-2 readings.  5) Write down these readings and bring with you to any provider appointments.  Bring your home blood pressure machine with you to a provider's office for accuracy comparison at least once a year.   Make sure you take your blood pressure medications before you come to any office visit, even if you were asked to fast for labs.   Estelle Grumbles, PharmD, Patsy [place], CPP Clinical Pharmacist [facility] [phone]

## 2023-04-23 NOTE — Progress Notes (Unsigned)
04/23/2023 Name: Nicholas Black MRN: [id number] DOB: [birth year]  Chief Complaint  Patient presents with   Medication Management   Medication Adherence   Medication Assistance    Nicholas Black is a 67 y.o. year old male who presented for a telephone visit.   They were referred to the pharmacist by their PCP for assistance in managing diabetes, hypertension, hyperlipidemia, and medication access.      Subjective:   Care Team: Primary Care Provider: Smitty Cords, Black ; Next Scheduled Visit: 05/22/2023 Nurse Care Manager: Nicholas Lair, RN; Next Scheduled Visit: 06/11/2023 Urology: Nicholas Altes, MD  Medication Access/Adherence  Current Pharmacy:  [facility] - [place], [place] - [address] [place] [address] Phone: [phone] Fax: [phone]   Patient reports affordability concerns with their medications: No  Patient reports access/transportation concerns to their pharmacy: No  Patient reports adherence concerns with their medications:  No   Reports now using weekly pillbox; denies missed doses    Diabetes:   Current medications:  Metformin ER 500 mg daily Ozempic 0.5 mg weekly on Tuesdays.  Reports tolerating well    Medications tried in the past: metformin ER 1000 mg daily - reports was unable to tolerate due to headache   Denies checking home blood sugar recently     Statin: pravastatin 40 mg daily   Current physical activity: attending spin class for 45 minutes x 3 days/week   Current medication access support: Patient enrolled in patient assistance for Ozempic from [employer] through 07/24/2023   Hypertension:   Current medications:  - amlodipine 10 mg daily - losartan 50 mg daily   Reports has access to upper arm blood pressure monitor at home, but denies monitoring home blood pressure recently   Patient denies hypotensive s/sx including dizziness, lightheadedness.    Current physical  activity: attending spin class for 45 minutes x 3 days/week   Objective:  Lab Results  Component Value Date   HGBA1C 6.8 (H) 01/18/2023    Lab Results  Component Value Date   CREATININE 0.83 01/18/2023   BUN 16 01/18/2023   NA 144 01/18/2023   K 4.4 01/18/2023   CL 107 01/18/2023   CO2 28 01/18/2023    Lab Results  Component Value Date   CHOL 111 01/18/2023   HDL 80 01/18/2023   LDLCALC 17 01/18/2023   TRIG 65 01/18/2023   CHOLHDL 1.4 01/18/2023   BP Readings from Last 3 Encounters:  03/21/23 (!) 142/86  02/21/23 (!) 149/88  01/19/23 134/84   Pulse Readings from Last 3 Encounters:  03/21/23 92  02/21/23 69  01/19/23 93     Medications Reviewed Today     Reviewed by Nicholas Black (Pharmacist) on 04/23/23 at 2136  Med List Status: <None>   Medication Order Taking? Sig Documenting Provider Last Dose Status Informant  amLODipine (NORVASC) 10 MG tablet [id number] Yes Take 1 tablet (10 mg total) by mouth daily. Nicholas Black Taking Active   BAYER ASPIRIN EC LOW DOSE 81 MG EC tablet [id number]   [provider]  Active   Blood Glucose Monitoring Suppl DEVI [id number]  1 each by Does not apply route in the morning, at noon, and at bedtime. May substitute to any manufacturer covered by patient's insurance. Nicholas Semen, MD  Active   Cholecalciferol (VITAMIN D HIGH POTENCY) 25 MCG (1000 UT) capsule [id number]  Take 2,000 Units by mouth daily.  Patient not taking:  Reported on 02/21/2023   [provider]  Active   cyclobenzaprine (FLEXERIL) 10 MG tablet [id number]  Take 1 tablet (10 mg total) by mouth 3 (three) times daily as needed for muscle spasms. Nicholas Munroe, NP  Active   finasteride (PROSCAR) 5 MG tablet [id number]  Take 1 tablet (5 mg total) by mouth daily. Nicholas Munroe, NP  Active   losartan (COZAAR) 50 MG tablet [id number] Yes Take 1 tablet (50 mg total) by mouth daily. Nicholas Cords, Black Taking Active    metFORMIN (GLUCOPHAGE-XR) 500 MG 24 hr tablet [id number] Yes Take 2 tablets (1,000 mg total) by mouth daily with breakfast.  Patient taking differently: Take 500 mg by mouth daily with breakfast.   Nicholas Cords, Black Taking Active   naproxen (NAPROSYN) 500 MG tablet [id number]  Take 1 tablet (500 mg total) by mouth 2 (two) times daily with a meal. Nicholas Munroe, NP  Active   Omega-3 Fatty Acids (FISH OIL) 1000 MG CAPS [id number]  Take by mouth daily.  Patient not taking: Reported on 04/05/2023   [provider]  Active   OZEMPIC, 0.25 OR 0.5 MG/DOSE, 2 MG/3ML SOPN [id number] Yes Inject 0.5 mg into the skin once a week. Nicholas Cords, Black Taking Active   pravastatin (PRAVACHOL) 40 MG tablet [id number]  Take 1 tablet (40 mg total) by mouth daily. Nicholas Cords, Black  Active   [facility] injection [id number]  Inject 0.5 mL into muscle for shingles vaccine. Repeat dose in 2-6 months. Nicholas Cords, Black  Active               Assessment/Plan:   Encourage patient to continue using weekly pillbox as adherence aid    Diabetes: - Have reviewed long term cardiovascular and renal outcomes of uncontrolled blood sugar - Reviewed dietary modifications including importance of having regular well-balanced meals throughout the day, while controlling carbohydrate portion sizes             Encourage patient to continue to avoid consumption of sugary beverages             Encourage patient to increase consumption of non-starchy vegetables - Encourage patient to restart monitoring home glucose and have record of results to review during upcoming appointments  - CPP sends updated prescription for patient's metformin ER 500 mg to pharmacy to reflect current dosing (1 tablet daily)   Hypertension: - Have reviewed long term cardiovascular and renal outcomes of uncontrolled blood pressure - Have reviewed appropriate blood pressure monitoring technique and reviewed  goal blood pressure.  - Have recommend to limit consumption of salt/sodium - CPP sends renewal of amlodipine to pharmacy for patient to last until next appointment with PCP - Recommended to check home blood pressure and heart rate and have record of results to review during upcoming appointments      Follow Up Plan: Clinical Pharmacist will follow up with patient by telephone on 05/14/2023 at 3:15 PM    Estelle Grumbles, PharmD, Patsy [place], CPP Clinical Pharmacist [facility] [phone]

## 2023-05-14 ENCOUNTER — Encounter: Payer: Self-pay | Admitting: Pharmacist

## 2023-05-14 ENCOUNTER — Ambulatory Visit: Payer: Medicare HMO | Admitting: Pharmacist

## 2023-05-14 DIAGNOSIS — I10 Essential (primary) hypertension: Secondary | ICD-10-CM

## 2023-05-14 DIAGNOSIS — E1169 Type 2 diabetes mellitus with other specified complication: Secondary | ICD-10-CM

## 2023-05-14 DIAGNOSIS — E785 Hyperlipidemia, unspecified: Secondary | ICD-10-CM

## 2023-05-14 NOTE — Patient Instructions (Signed)
Goals Addressed             This Visit's Progress    Pharmacy Goals       Our goal A1c is less than 7%. This corresponds with fasting sugars less than 130 and 2 hour after meal sugars less than 180. Please keep a log of your results when checking your blood sugar   Our goal bad cholesterol, or LDL, is less than 70 . This is why it is important to continue taking your pravastatin.  Check your blood pressure twice weekly, and any time you have concerning symptoms like headache, chest pain, dizziness, shortness of breath, or vision changes.   Our goal is less than 130/80.  To appropriately check your blood pressure, make sure you do the following:  1) Avoid caffeine, exercise, or tobacco products for 30 minutes before checking. Empty your bladder. 2) Sit with your back supported in a flat-backed chair. Rest your arm on something flat (arm of the chair, table, etc). 3) Sit still with your feet flat on the floor, resting, for at least 5 minutes.  4) Check your blood pressure. Take 1-2 readings.  5) Write down these readings and bring with you to any provider appointments.  Bring your home blood pressure machine with you to a provider's office for accuracy comparison at least once a year.   Make sure you take your blood pressure medications before you come to any office visit, even if you were asked to fast for labs.  Wallace Cullens, PharmD, Para March, CPP Clinical Pharmacist [facility] [phone]

## 2023-05-14 NOTE — Progress Notes (Signed)
05/14/2023 Name: Nicholas Black MRN: [id number] DOB: [birth year]  Chief Complaint  Patient presents with   Medication Assistance   Medication Management   Medication Adherence    Nicholas Black is a 67 y.o. year old male who presented for a telephone visit.   They were referred to the pharmacist by their PCP for assistance in managing diabetes, hypertension, hyperlipidemia, and medication access.      Subjective:   Care Team: Primary Care Provider: Smitty Cords, DO ; Next Scheduled Visit: 05/22/2023 Nurse Care Manager: Carma Lair, RN; Next Scheduled Visit: 06/11/2023 Urology: [name], MD  Medication Access/Adherence  Current Pharmacy:  [facility] - [place], [place] - [address] [place] [address] Phone: [phone] Fax: [phone]   Patient reports affordability concerns with their medications: No  Patient reports access/transportation concerns to their pharmacy: No  Patient reports adherence concerns with their medications:  No   Reports now using weekly pillbox; denies missed doses recently    Diabetes:   Current medications:  Metformin ER 500 mg daily Ozempic 0.5 mg weekly on Tuesdays.  Reports tolerating well    Medications tried in the past: metformin ER 1000 mg daily - reports was unable to tolerate due to headache   Denies checking home blood sugar recently     Statin: pravastatin 40 mg daily   Current physical activity: attending spin class for 45 minutes x 3 days/week   Current medication access support: Patient enrolled in patient assistance for Ozempic from [employer] through 07/24/2023 - Reports has sufficient supply to last through end of calendar year   Hypertension:   Current medications:  - amlodipine 10 mg daily - losartan 50 mg daily   Reports has access to upper arm blood pressure monitor at home, but denies monitoring home blood pressure recently   Patient  denies hypotensive s/sx including dizziness, lightheadedness.    Current physical activity: attending spin class for 45 minutes x 3 days/week   Objective:  Lab Results  Component Value Date   HGBA1C 6.8 (H) 01/18/2023    Lab Results  Component Value Date   CREATININE 0.83 01/18/2023   BUN 16 01/18/2023   NA 144 01/18/2023   K 4.4 01/18/2023   CL 107 01/18/2023   CO2 28 01/18/2023    Lab Results  Component Value Date   CHOL 111 01/18/2023   HDL 80 01/18/2023   LDLCALC 17 01/18/2023   TRIG 65 01/18/2023   CHOLHDL 1.4 01/18/2023   BP Readings from Last 3 Encounters:  03/21/23 (!) 142/86  02/21/23 (!) 149/88  01/19/23 134/84   Pulse Readings from Last 3 Encounters:  03/21/23 92  02/21/23 69  01/19/23 93     Medications Reviewed Today     Reviewed by Manuela Neptune, RPH-CPP (Pharmacist) on 05/14/23 at 1526  Med List Status: <None>   Medication Order Taking? Sig Documenting Provider Last Dose Status Informant  amLODipine (NORVASC) 10 MG tablet [id number] Yes Take 1 tablet (10 mg total) by mouth daily. Karamalegos, Netta Neat, DO Taking Active   BAYER ASPIRIN EC LOW DOSE 81 MG EC tablet [id number]   [provider]  Active   Blood Glucose Monitoring Suppl DEVI [id number]  1 each by Does not apply route in the morning, at noon, and at bedtime. May substitute to any manufacturer covered by patient's insurance. Phineas Semen, MD  Active   Cholecalciferol (VITAMIN D HIGH POTENCY) 25 MCG (1000 UT)  capsule [id number]  Take 2,000 Units by mouth daily.  Patient not taking: Reported on 02/21/2023   [provider]  Active   cyclobenzaprine (FLEXERIL) 10 MG tablet [id number]  Take 1 tablet (10 mg total) by mouth 3 (three) times daily as needed for muscle spasms. Lorre Munroe, NP  Active   finasteride (PROSCAR) 5 MG tablet [id number]  Take 1 tablet (5 mg total) by mouth daily. Lorre Munroe, NP  Active   losartan (COZAAR) 50 MG tablet [id number] Yes Take 1  tablet (50 mg total) by mouth daily. Smitty Cords, DO Taking Active   metFORMIN (GLUCOPHAGE-XR) 500 MG 24 hr tablet [id number] Yes Take 1 tablet (500 mg total) by mouth daily with breakfast. Smitty Cords, DO Taking Active   naproxen (NAPROSYN) 500 MG tablet [id number]  Take 1 tablet (500 mg total) by mouth 2 (two) times daily with a meal. Lorre Munroe, NP  Active   Omega-3 Fatty Acids (FISH OIL) 1000 MG CAPS [id number]  Take by mouth daily.  Patient not taking: Reported on 04/05/2023   [provider]  Active   OZEMPIC, 0.25 OR 0.5 MG/DOSE, 2 MG/3ML SOPN [id number] Yes Inject 0.5 mg into the skin once a week. Smitty Cords, DO Taking Active   pravastatin (PRAVACHOL) 40 MG tablet [id number] Yes Take 1 tablet (40 mg total) by mouth daily. Smitty Cords, DO Taking Active   [facility] injection [id number]  Inject 0.5 mL into muscle for shingles vaccine. Repeat dose in 2-6 months. Smitty Cords, DO  Active               Assessment/Plan:   Encourage patient to continue using weekly pillbox as adherence aid   Encourage patient to follow up with pharmacy regarding obtaining Shingrix vaccination series   Diabetes: - Have reviewed long term cardiovascular and renal outcomes of uncontrolled blood sugar - Encourage patient to restart monitoring home glucose and have record of results to review during upcoming appointments  - Will collaborate with PCP and CPhT to aid patient with re-enrollment for Ozempic patient assistance program from [employer] for 2025 calendar year    Hypertension: - Have reviewed long term cardiovascular and renal outcomes of uncontrolled blood pressure - Have reviewed appropriate blood pressure monitoring technique and reviewed goal blood pressure.  - Have recommend to limit consumption of salt/sodium - Recommended to check home blood pressure and heart rate and have record of results to review during upcoming  appointments      Follow Up Plan: Clinical Pharmacist will follow up with patient by telephone on 08/13/2023 at 3:00 PM    Estelle Grumbles, PharmD, P[place]y [place], CPP Clinical Pharmacist [facility] [phone]

## 2023-05-22 ENCOUNTER — Ambulatory Visit (INDEPENDENT_AMBULATORY_CARE_PROVIDER_SITE_OTHER): Payer: Medicare HMO | Admitting: Family Medicine

## 2023-05-22 ENCOUNTER — Encounter: Payer: Self-pay | Admitting: Family Medicine

## 2023-05-22 VITALS — BP 120/70 | HR 73 | Ht 68.0 in | Wt 199.0 lb

## 2023-05-22 DIAGNOSIS — E7800 Pure hypercholesterolemia, unspecified: Secondary | ICD-10-CM

## 2023-05-22 DIAGNOSIS — I10 Essential (primary) hypertension: Secondary | ICD-10-CM | POA: Diagnosis not present

## 2023-05-22 DIAGNOSIS — M545 Low back pain, unspecified: Secondary | ICD-10-CM | POA: Diagnosis not present

## 2023-05-22 DIAGNOSIS — E1169 Type 2 diabetes mellitus with other specified complication: Secondary | ICD-10-CM | POA: Diagnosis not present

## 2023-05-22 DIAGNOSIS — Z23 Encounter for immunization: Secondary | ICD-10-CM | POA: Diagnosis not present

## 2023-05-22 LAB — POCT GLYCOSYLATED HEMOGLOBIN (HGB A1C): Hemoglobin A1C: 6.2 % — AB (ref 4.0–5.6)

## 2023-05-22 MED ORDER — LOSARTAN POTASSIUM 50 MG PO TABS: 50.0000 mg | ORAL_TABLET | Freq: Every day | ORAL | 3 refills | Status: DC

## 2023-05-22 MED ORDER — PRAVASTATIN SODIUM 40 MG PO TABS: 40.0000 mg | ORAL_TABLET | Freq: Every day | ORAL | 3 refills | Status: DC

## 2023-05-22 MED ORDER — AMLODIPINE BESYLATE 10 MG PO TABS: 10.0000 mg | ORAL_TABLET | Freq: Every day | ORAL | 3 refills | Status: DC

## 2023-05-22 MED ORDER — FINASTERIDE 5 MG PO TABS: 5.0000 mg | ORAL_TABLET | Freq: Every day | ORAL | 3 refills | Status: DC

## 2023-05-22 NOTE — Progress Notes (Unsigned)
Subjective:    Patient ID: Nicholas Black, male    DOB: Dec 20, 1955, 67 y.o.   MRN: [id number]  Nicholas Black is a 67 y.o. male presenting on 05/22/2023 for Diabetes   HPI  Discussed the use of AI scribe software for clinical note transcription with the patient, who gave verbal consent to proceed.     The patient, with a history of diabetes and hypertension, presented for a four-month follow-up visit primarily to monitor blood sugar levels.   CHRONIC DM, Type 2: Improved control on GLP1 and lifestyle changes He receives medicine through the patient assistance program working with clinical pharmacy Meds: Metformin XR 500mg  daily, Ozempic 0.5mg  weekly inj (on Tues) Reports  good compliance. Tolerating well w/o side-effects Currently on ARB Diet improved lower carb sugar now, less tea, less fried Denies hypoglycemia, polyuria, visual changes  CHRONIC HTN: Home BP readings controlled. Today normal BP Current Meds - Amlodipine 10mg  daily, Losartan 50mg  daily Tolerating well, w/o complaints. Denies HA, chest pain, syncope, lightheaded, dizzy    The patient also reported a crawling sensation in the left arm, described as a feeling of something moving inside, occasionally accompanied by a pins and needles sensation. This symptom was more noticeable in the evenings and did not present any associated pain, weakness, or rash. 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 The patient reported some difficulty with this due to arm weakness, suspected to be related to the previously diagnosed arthritis. The patient expressed interest in exploring surgical options for this issue in the future.   Health Maintenance:  Flu Shot today  Future Shingrix vaccine.      04/05/2023   12:07 PM 10/20/2022    1:51 PM 10/02/2022    2:58 PM  Depression screen PHQ 2/9  Decreased Interest 0 0 0  Down, Depressed, Hopeless 0 0 0  PHQ - 2 Score 0 0 0  Altered sleeping 0  2  Tired, decreased energy 0  2  Change in appetite 0  0  Feeling bad or failure about yourself  0  0  Trouble concentrating 0  0  Moving slowly or fidgety/restless 0  1  Suicidal thoughts 0  0  PHQ-9 Score 0  5  Difficult doing work/chores Not difficult at all  Somewhat difficult    Social History   Tobacco Use   Smoking status: Former    Current packs/day: 0.00    Average packs/day: 1 pack/day for 20.0 years (20.0 ttl pk-yrs)    Types: Cigarettes    Start date: 62    Quit date: 2016    Years since quitting: 8.8   Smokeless tobacco: Former  Building services engineer status: Never Used  Substance Use Topics   Alcohol use: Yes    Alcohol/week: 3.0 standard drinks of alcohol    Types: 3 Cans of beer per week   Drug use: Never    Review of Systems Per HPI unless specifically indicated above     Objective:    BP 120/70   Pulse 73   Ht 5\' 8"  (1.727 m)   Wt 199 lb (90.3 kg)   SpO2 100%   BMI 30.26 kg/m   Wt Readings from Last 3 Encounters:  05/22/23 199 lb (90.3 kg)  03/21/23 197 lb (89.4 kg)  02/21/23 192 lb (87.1 kg)    Physical Exam Vitals and nursing note reviewed.  Constitutional:      General: He is not in acute distress.    Appearance: He is well-developed. He is not diaphoretic.     Comments: Well-appearing, comfortable, cooperative  HENT:     Head: Normocephalic and atraumatic.  Eyes:     General:        Right eye: No discharge.        Left eye: No discharge.     Conjunctiva/sclera: Conjunctivae normal.  Neck:     Thyroid: No thyromegaly.  Cardiovascular:     Rate and Rhythm: Normal rate and regular rhythm.     Pulses: Normal pulses.     Heart sounds: Normal heart sounds. No murmur heard. Pulmonary:     Effort: Pulmonary effort is normal. No respiratory  distress.     Breath sounds: Normal breath sounds. No wheezing or rales.  Musculoskeletal:        General: Normal range of motion.     Cervical back: Normal range of motion and neck supple.  Lymphadenopathy:     Cervical: No cervical adenopathy.  Skin:    General: Skin is warm and dry.     Findings: No erythema or rash.  Neurological:     Mental Status: He is alert and oriented to person, place, and time. Mental status is at baseline.  Psychiatric:        Behavior: Behavior normal.     Comments: Well groomed, good eye contact, normal speech and thoughts      Results for orders placed or performed in visit on 05/22/23  POCT glycosylated hemoglobin (Hb A1C)  Result Value Ref Range   Hemoglobin A1C 6.2 (A) 4.0 - 5.6 %   HbA1c POC (<> result, manual entry)     HbA1c, POC (prediabetic range)     HbA1c, POC (controlled diabetic range)        Assessment & Plan:   Problem List Items Addressed This Visit     Essential hypertension   Relevant Medications   pravastatin (PRAVACHOL) 40 MG tablet   losartan (COZAAR) 50 MG tablet   amLODipine (NORVASC) 10 MG tablet   BAYER ASPIRIN EC LOW DOSE 81 MG tablet   Type 2 diabetes mellitus with other specified complication (HCC) - Primary   Relevant Medications   pravastatin (PRAVACHOL) 40 MG tablet   losartan (COZAAR) 50 MG tablet   BAYER ASPIRIN EC LOW DOSE 81 MG tablet   Other Relevant Orders   POCT glycosylated hemoglobin (Hb A1C) (Completed)   Other Visit Diagnoses     Need for influenza vaccination       Relevant Orders   Flu Vaccine Trivalent High Dose (Fluad)   Elevated cholesterol       Relevant Medications   pravastatin (PRAVACHOL) 40 MG tablet   losartan (COZAAR) 50 MG tablet   amLODipine (NORVASC) 10 MG tablet   BAYER ASPIRIN EC LOW DOSE 81 MG tablet   Acute midline low back pain without sciatica       Relevant Medications   finasteride (PROSCAR) 5 MG tablet   BAYER ASPIRIN EC LOW DOSE 81 MG tablet        Assessment and Plan    Type 2 Diabetes Mellitus Significant improvement in glycemic control with A1c of 6.2, down from 13.4 seven months ago. Currently on Ozempic 0.5mg  weekly. Discussed potential future dose increase if appetite increases,  weight gain occurs, or blood sugar elevates. Patient expressed desire to discontinue Metformin. -Discontinue Metformin. -Continue Ozempic 0.5mg  weekly. -Check A1c in 4 months.  Shoulder Arthritis Reports of crawling sensation in left arm, possibly due to nerve impingement. No pain or weakness. Discussed potential causes and management strategies. -Consider modifications in body positions and activities to reduce nerve impingement. -Discuss further management options in future visits if symptoms persist.  General Health Maintenance -Administer influenza vaccine today. -Patient to continue efforts to receive shingles vaccine. -Renew prescriptions for blood pressure medications. -Check blood sugar with finger prick at next visit in 4 months.        Meds ordered this encounter  Medications   pravastatin (PRAVACHOL) 40 MG tablet    Sig: Take 1 tablet (40 mg total) by mouth daily.    Dispense:  90 tablet    Refill:  3    Keep any additional refills on file   losartan (COZAAR) 50 MG tablet    Sig: Take 1 tablet (50 mg total) by mouth daily.    Dispense:  90 tablet    Refill:  3    Keep any additional refills on file   finasteride (PROSCAR) 5 MG tablet    Sig: Take 1 tablet (5 mg total) by mouth daily.    Dispense:  90 tablet    Refill:  3    Keep any additional refills on file   amLODipine (NORVASC) 10 MG tablet    Sig: Take 1 tablet (10 mg total) by mouth daily.    Dispense:  90 tablet    Refill:  3    Keep any additional refills on file      Follow up plan: Return in about 4 months (around 09/21/2023) for 4 month DM A1c, R Shoulder bursitis.   Saralyn Pilar, DO [id number] 05/22/2023, 9:15 AM

## 2023-05-22 NOTE — Patient Instructions (Addendum)
Thank you for coming to the office today.  Keep on Ozempic 0.5mg  weekly  Recent Labs    10/02/22 1517 01/18/23 0753 05/22/23 0913  HGBA1C 13.4* 6.8* 6.2*   BP is controlled  Refilled all medications.  Likely pinched nerve in neck or shoulder or elbow, Possibly elbow. I suggest wrapping a bulky towel around elbow at night to avoid compressing a nerve that can cause the pins and needles tingling.  -------------  Please schedule a Follow-up Appointment to: Return in about 4 months (around 09/21/2023) for 4 month DM A1c, R Shoulder bursitis.  If you have any other questions or concerns, please feel free to call the office or send a message through MyChart. You may also schedule an earlier appointment if necessary.  Additionally, you may be receiving a survey about your experience at our office within a few days to 1 week by e-mail or mail. We value your feedback.  Saralyn Pilar, DO [facility], [place]

## 2023-06-11 ENCOUNTER — Other Ambulatory Visit: Payer: Self-pay

## 2023-06-11 NOTE — Patient Outreach (Signed)
Care Management   Visit Note  06/11/2023 Name: Nicholas Black MRN: [id number] DOB: 10-05-1955  Subjective: Nicholas Black is a 67 y.o. year old male who is a primary care patient of Smitty Cords, DO. The Care Management team was consulted for assistance.      Engaged with patient spoke with patient by telephone.    Goals Addressed             This Visit's Progress    RNCM Care Management Expected Outcome:  Monitor, Self-Manage and Reduce Symptoms of Diabetes       Current Barriers:  Knowledge Deficits related to importance of following the plan of care for effective management of DM Care Coordination needs related to cost constraints for medications, is working with the pharm D for assistance with getting Ozempic in a patient with DM Chronic Disease Management support and education needs related to effective management of DM Lab Results  Component Value Date   HGBA1C 6.2 (A) 05/22/2023  Previous A1c 13.4 in March of 2024   Planned Interventions: Provided education to patient about basic DM disease process. The patient has a good understanding of his DM. Is doing well on the current plan of care. The patient is happy that his blood sugars are doing well.  His A1C is WNL and he is happy about this. Praised the patient for doing a great job at getting his levels down. ; Reviewed medications with patient and discussed importance of medication adherence. Has medications. Ozempic 0.5 mg is working well for managing his DM. Works with pharm D on a regular basis.  Reviewed prescribed diet with patient heart healthy/ADA diet, will attach information to MyChart for the patient for review and education. Discussed foods high in carbohydrates  and to eat in moderation foods like pasta, rice, potatoes. The patient states he has french fries every now and then, but denies any acute changes in dietary habits.  The patient has educational information available and knows to call RNCM for  additional information.  Counseled on importance of regular laboratory monitoring as prescribed. Has on a regular basis. Labs up to date;        Discussed plans with patient for ongoing care management follow up and provided patient with direct contact information for care management team;      Provided patient with written educational materials related to hypo and hyperglycemia and importance of correct treatment. Denies any highs or lows. The patient is doing well and happy his blood sugars are doing well. ;       Reviewed scheduled/upcoming provider appointments including: 09-18-2023 at 0840 am;         Advised patient, providing education and rationale, to check cbg twice daily and when you have symptoms of low or high blood sugar and record. Checks his blood sugars on a regular basis. Denies any acute findings today.      call provider for findings outside established parameters;       Referral made to pharmacy team for assistance with working with pharm D for assistance with getting Ozempic. The patient is taking Metformin and Ozempic and works with pharm D on a regular basis.  Review of patient status, including review of consultants reports, relevant laboratory and other test results, and medications completed;       Advised patient to discuss changes in his DM, questions or concerns with provider;      Screening for signs and symptoms of depression related to chronic disease  state;        Assessed social determinant of health barriers;        Review of online resources to help with DM management. Look at the website American Diabetes Association at https://www.diabetes.org. The site has free resources and meal planning information The patient is going to the gym sometimes 2 times a day for classes. He tries to go 3 days a week. He also rides his bike 4 miles to the [facility] and back. Takes spin classes and zumba. Is very active.  Continues to do spin classes and had just finished a spin class at the  time of the call. Denies any new concerns.  Symptom Management: Take medications as prescribed   Attend all scheduled provider appointments Call provider office for new concerns or questions  call the Suicide and Crisis Lifeline: 988 call the [place] National Suicide Prevention Lifeline: 217 023 0198 or TTY: 475-144-0111 TTY 6467800810) to talk to a trained counselor call 1-800-273-TALK (toll free, 24 hour hotline) if experiencing a Mental Health or Behavioral Health Crisis  check feet daily for cuts, sores or redness trim toenails straight across manage portion size wash and dry feet carefully every day wear comfortable, cotton socks wear comfortable, well-fitting shoes  Follow Up Plan: Telephone follow up appointment with care management team member scheduled for: 08-13-2023 at 0900 am       RNCM Care Management Expected Outcome:  Monitor, Self-Manage, and Reduce Symptoms of Hypertension       Current Barriers:  Chronic Disease Management support and education needs related to effective management of HTN BP Readings from Last 3 Encounters:  05/22/23 120/70  03/21/23 (!) 142/86  02/21/23 (!) 149/88     Planned Interventions: Evaluation of current treatment plan related to hypertension self management and patient's adherence to plan as established by provider. The patients blood pressures are more stable recently but has been dealing with some shoulder pain and has some spurs in his shoulders. He denies any acute changes in his HTN or Heart health.  Provided education to patient re: stroke prevention, s/s of heart attack and stroke; Reviewed prescribed diet heart healthy/ADA diet. Compliant with heart healthy/ADA diet Reviewed medications with patient and discussed importance of compliance. Denies any issues with medications, works with pharm D on a regular basis;  Discussed plans with patient for ongoing care management follow up and provided patient with direct contact information  for care management team; Advised patient, providing education and rationale, to monitor blood pressure daily and record, calling PCP for findings outside established parameters. The patient is not monitoring his blood pressures but feels his blood pressures are good. Education provided. The patient denies any headaches or other sx and sx of headache. He states he gets a  headache sometimes, education provided that this may be a sign of his blood pressures being elevated. Will continue to monitor. ;  Reviewed scheduled/upcoming provider appointments including: 09-18-2023 at 0840 am Advised patient to discuss changes in HTN or heart health with provider; Provided education on prescribed diet heart healthy/ADA diet. Will attach information in my Chart for the patient;  Discussed complications of poorly controlled blood pressure such as heart disease, stroke, circulatory complications, vision complications, kidney impairment, sexual dysfunction;  Screening for signs and symptoms of depression related to chronic disease state;  Assessed social determinant of health barriers;  Wants recommendations from the pcp on what is the best course of action for the bone spurs he has in his shoulders. Will send an inbasket message  and ask for recommendations and will follow up accordingly with the patient.   Symptom Management: Take medications as prescribed   Attend all scheduled provider appointments Call provider office for new concerns or questions  call the Suicide and Crisis Lifeline: 988 call the [place] National Suicide Prevention Lifeline: (213)096-4723 or TTY: 954-718-0898 TTY 248-305-7686) to talk to a trained counselor call 1-800-273-TALK (toll free, 24 hour hotline) if experiencing a Mental Health or Behavioral Health Crisis  check blood pressure weekly learn about high blood pressure call doctor for signs and symptoms of high blood pressure develop an action plan for high blood pressure keep all  doctor appointments take medications for blood pressure exactly as prescribed report new symptoms to your doctor  Follow Up Plan: Telephone follow up appointment with care management team member scheduled for: [id number] at 0900 am           Consent to Services:  Patient was given information about care management services, agreed to services, and gave verbal consent to participate.   Plan: Telephone follow up appointment with care management team member scheduled for: 08-13-2023 at 9 am  Alto Denver RN, MSN, CCM RN Care Manager  [facility]  Ambulatory Care Management  Direct Number: [phone]

## 2023-06-11 NOTE — Patient Instructions (Signed)
Visit Information  Thank you for taking time to visit with me today. Please don't hesitate to contact me if I can be of assistance to you before our next scheduled telephone appointment.  Following are the goals we discussed today:   Goals Addressed             This Visit's Progress    RNCM Care Management Expected Outcome:  Monitor, Self-Manage and Reduce Symptoms of Diabetes       Current Barriers:  Knowledge Deficits related to importance of following the plan of care for effective management of DM Care Coordination needs related to cost constraints for medications, is working with the pharm D for assistance with getting Ozempic in a patient with DM Chronic Disease Management support and education needs related to effective management of DM Lab Results  Component Value Date   HGBA1C 6.2 (A) 05/22/2023  Previous A1c 13.4 in March of 2024   Planned Interventions: Provided education to patient about basic DM disease process. The patient has a good understanding of his DM. Is doing well on the current plan of care. The patient is happy that his blood sugars are doing well.  His A1C is WNL and he is happy about this. Praised the patient for doing a great job at getting his levels down. ; Reviewed medications with patient and discussed importance of medication adherence. Has medications. Ozempic 0.5 mg is working well for managing his DM. Works with pharm D on a regular basis.  Reviewed prescribed diet with patient heart healthy/ADA diet, will attach information to MyChart for the patient for review and education. Discussed foods high in carbohydrates  and to eat in moderation foods like pasta, rice, potatoes. The patient states he has french fries every now and then, but denies any acute changes in dietary habits.  The patient has educational information available and knows to call RNCM for additional information.  Counseled on importance of regular laboratory monitoring as prescribed. Has on  a regular basis. Labs up to date;        Discussed plans with patient for ongoing care management follow up and provided patient with direct contact information for care management team;      Provided patient with written educational materials related to hypo and hyperglycemia and importance of correct treatment. Denies any highs or lows. The patient is doing well and happy his blood sugars are doing well. ;       Reviewed scheduled/upcoming provider appointments including: 09-18-2023 at 0840 am;         Advised patient, providing education and rationale, to check cbg twice daily and when you have symptoms of low or high blood sugar and record. Checks his blood sugars on a regular basis. Denies any acute findings today.      call provider for findings outside established parameters;       Referral made to pharmacy team for assistance with working with pharm D for assistance with getting Ozempic. The patient is taking Metformin and Ozempic and works with pharm D on a regular basis.  Review of patient status, including review of consultants reports, relevant laboratory and other test results, and medications completed;       Advised patient to discuss changes in his DM, questions or concerns with provider;      Screening for signs and symptoms of depression related to chronic disease state;        Assessed social determinant of health barriers;  Review of online resources to help with DM management. Look at the website American Diabetes Association at https://www.diabetes.org. The site has free resources and meal planning information The patient is going to the gym sometimes 2 times a day for classes. He tries to go 3 days a week. He also rides his bike 4 miles to the [facility] and back. Takes spin classes and zumba. Is very active.  Continues to do spin classes and had just finished a spin class at the time of the call. Denies any new concerns.  Symptom Management: Take medications as prescribed    Attend all scheduled provider appointments Call provider office for new concerns or questions  call the Suicide and Crisis Lifeline: 988 call the [place] National Suicide Prevention Lifeline: 201-266-5894 or TTY: 435-637-1686 TTY (604) 585-6587) to talk to a trained counselor call 1-800-273-TALK (toll free, 24 hour hotline) if experiencing a Mental Health or Behavioral Health Crisis  check feet daily for cuts, sores or redness trim toenails straight across manage portion size wash and dry feet carefully every day wear comfortable, cotton socks wear comfortable, well-fitting shoes  Follow Up Plan: Telephone follow up appointment with care management team member scheduled for: 08-13-2023 at 0900 am       RNCM Care Management Expected Outcome:  Monitor, Self-Manage, and Reduce Symptoms of Hypertension       Current Barriers:  Chronic Disease Management support and education needs related to effective management of HTN BP Readings from Last 3 Encounters:  05/22/23 120/70  03/21/23 (!) 142/86  02/21/23 (!) 149/88     Planned Interventions: Evaluation of current treatment plan related to hypertension self management and patient's adherence to plan as established by provider. The patients blood pressures are more stable recently but has been dealing with some shoulder pain and has some spurs in his shoulders. He denies any acute changes in his HTN or Heart health.  Provided education to patient re: stroke prevention, s/s of heart attack and stroke; Reviewed prescribed diet heart healthy/ADA diet. Compliant with heart healthy/ADA diet Reviewed medications with patient and discussed importance of compliance. Denies any issues with medications, works with pharm D on a regular basis;  Discussed plans with patient for ongoing care management follow up and provided patient with direct contact information for care management team; Advised patient, providing education and rationale, to monitor blood  pressure daily and record, calling PCP for findings outside established parameters. The patient is not monitoring his blood pressures but feels his blood pressures are good. Education provided. The patient denies any headaches or other sx and sx of headache. He states he gets a  headache sometimes, education provided that this may be a sign of his blood pressures being elevated. Will continue to monitor. ;  Reviewed scheduled/upcoming provider appointments including: 09-18-2023 at 0840 am Advised patient to discuss changes in HTN or heart health with provider; Provided education on prescribed diet heart healthy/ADA diet. Will attach information in my Chart for the patient;  Discussed complications of poorly controlled blood pressure such as heart disease, stroke, circulatory complications, vision complications, kidney impairment, sexual dysfunction;  Screening for signs and symptoms of depression related to chronic disease state;  Assessed social determinant of health barriers;  Wants recommendations from the pcp on what is the best course of action for the bone spurs he has in his shoulders. Will send an inbasket message and ask for recommendations and will follow up accordingly with the patient.   Symptom Management: Take medications as prescribed  Attend all scheduled provider appointments Call provider office for new concerns or questions  call the Suicide and Crisis Lifeline: 988 call the [place] National Suicide Prevention Lifeline: 856-581-5106 or TTY: (505)440-7889 TTY 774-028-4769) to talk to a trained counselor call 1-800-273-TALK (toll free, 24 hour hotline) if experiencing a Mental Health or Behavioral Health Crisis  check blood pressure weekly learn about high blood pressure call doctor for signs and symptoms of high blood pressure develop an action plan for high blood pressure keep all doctor appointments take medications for blood pressure exactly as prescribed report new symptoms  to your doctor  Follow Up Plan: Telephone follow up appointment with care management team member scheduled for: 11-18-20241-20-2025 at 0900 am           Our next appointment is by telephone on 08-13-2023 at 9 am  Please call the care guide team at [phone] if you need to cancel or reschedule your appointment.   If you are experiencing a Mental Health or Behavioral Health Crisis or need someone to talk to, please call the Suicide and Crisis Lifeline: 988 call the [place] National Suicide Prevention Lifeline: 612 168 8314 or TTY: 616-689-2251 TTY (430)368-8073) to talk to a trained counselor call 1-800-273-TALK (toll free, 24 hour hotline)   Patient verbalizes understanding of instructions and care plan provided today and agrees to view in MyChart. Active MyChart status and patient understanding of how to access instructions and care plan via MyChart confirmed with patient.       Alto Denver RN, MSN, CCM RN Care Manager  [facility]  Ambulatory Care Management  Direct Number: [phone]

## 2023-06-18 ENCOUNTER — Encounter: Payer: Self-pay | Admitting: Pharmacist

## 2023-06-22 ENCOUNTER — Other Ambulatory Visit: Payer: Self-pay

## 2023-06-22 ENCOUNTER — Encounter: Payer: Self-pay | Admitting: Emergency Medicine

## 2023-06-22 ENCOUNTER — Emergency Department: Payer: Medicare HMO

## 2023-06-22 ENCOUNTER — Emergency Department
Admission: EM | Admit: 2023-06-22 | Discharge: 2023-06-22 | Disposition: A | Discharge status: Home / Self Care | Payer: Medicare HMO | Attending: Emergency Medicine | Admitting: Emergency Medicine

## 2023-06-22 DIAGNOSIS — Y92410 Unspecified street and highway as the place of occurrence of the external cause: Secondary | ICD-10-CM | POA: Diagnosis not present

## 2023-06-22 DIAGNOSIS — S62323A Displaced fracture of shaft of third metacarpal bone, left hand, initial encounter for closed fracture: Secondary | ICD-10-CM | POA: Insufficient documentation

## 2023-06-22 DIAGNOSIS — S6992XA Unspecified injury of left wrist, hand and finger(s), initial encounter: Secondary | ICD-10-CM | POA: Diagnosis present

## 2023-06-22 DIAGNOSIS — E119 Type 2 diabetes mellitus without complications: Secondary | ICD-10-CM | POA: Diagnosis not present

## 2023-06-22 DIAGNOSIS — S161XXA Strain of muscle, fascia and tendon at neck level, initial encounter: Secondary | ICD-10-CM | POA: Diagnosis not present

## 2023-06-22 DIAGNOSIS — M79642 Pain in left hand: Secondary | ICD-10-CM | POA: Insufficient documentation

## 2023-06-22 DIAGNOSIS — S39012A Strain of muscle, fascia and tendon of lower back, initial encounter: Secondary | ICD-10-CM | POA: Diagnosis not present

## 2023-06-22 DIAGNOSIS — Z041 Encounter for examination and observation following transport accident: Secondary | ICD-10-CM | POA: Diagnosis not present

## 2023-06-22 DIAGNOSIS — I10 Essential (primary) hypertension: Secondary | ICD-10-CM | POA: Diagnosis not present

## 2023-06-22 DIAGNOSIS — S62329A Displaced fracture of shaft of unspecified metacarpal bone, initial encounter for closed fracture: Secondary | ICD-10-CM

## 2023-06-22 DIAGNOSIS — S62321A Displaced fracture of shaft of second metacarpal bone, left hand, initial encounter for closed fracture: Secondary | ICD-10-CM | POA: Diagnosis not present

## 2023-06-22 MED ORDER — OXYCODONE HCL 5 MG PO TABS: 5.0000 mg | ORAL_TABLET | Freq: Three times a day (TID) | ORAL | 0 refills | Status: DC | PRN

## 2023-06-22 MED ORDER — LIDOCAINE 5 % EX PTCH
2.0000 | MEDICATED_PATCH | CUTANEOUS | Status: DC
  Administered 2023-06-22: 2 via TRANSDERMAL
  Filled 2023-06-22: qty 2

## 2023-06-22 MED ORDER — LIDOCAINE 5 % EX PTCH: 1.0000 | MEDICATED_PATCH | Freq: Two times a day (BID) | CUTANEOUS | 0 refills | Status: AC

## 2023-06-22 NOTE — Discharge Instructions (Addendum)
Take acetaminophen 650 mg and ibuprofen 400 mg every 6 hours for pain.  Take with food. Use Lidoderm patches daily as prescribed. Use oxycodone as prescribed for severe pain.  Keep your hand in the splint until you are able to see orthopedist Dr. Audelia Acton.  Give them a call first thing on Monday morning to schedule an appointment.  Thank you for choosing [place] for your health care today!  Please see your primary doctor this week for a follow up appointment.   If you have any new, worsening, or unexpected symptoms call your doctor right away or come back to the emergency department for reevaluation.  It was my pleasure to care for you today.   Daneil Dan Modesto Charon, MD
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
## 2023-07-02 DIAGNOSIS — S6992XD Unspecified injury of left wrist, hand and finger(s), subsequent encounter: Secondary | ICD-10-CM | POA: Diagnosis not present

## 2023-07-02 DIAGNOSIS — S60032D Contusion of left middle finger without damage to nail, subsequent encounter: Secondary | ICD-10-CM | POA: Diagnosis not present

## 2023-07-02 DIAGNOSIS — E1169 Type 2 diabetes mellitus with other specified complication: Secondary | ICD-10-CM | POA: Diagnosis not present

## 2023-08-08 ENCOUNTER — Telehealth: Payer: Self-pay

## 2023-08-08 NOTE — Telephone Encounter (Signed)
 Received  pt application today for Novo Nordisk (Ozempic ) and fax provider portion 08/07/23. RPH Nellie Banas ask to fax it to [place] at Dr office.

## 2023-08-08 NOTE — Telephone Encounter (Signed)
 Receive pt's PAP for Novo Nordisk(Novolog ) left a HIPAA VM.

## 2023-08-09 NOTE — Telephone Encounter (Signed)
Received Dr portion,faxed pt portion and Dr portion to [employer] today 08/09/23 will follow up.

## 2023-08-13 ENCOUNTER — Other Ambulatory Visit: Payer: Self-pay

## 2023-08-13 ENCOUNTER — Other Ambulatory Visit: Payer: Medicare HMO | Admitting: Pharmacist

## 2023-08-13 ENCOUNTER — Telehealth: Payer: Self-pay

## 2023-08-13 ENCOUNTER — Other Ambulatory Visit: Payer: Medicare HMO

## 2023-08-13 DIAGNOSIS — E1169 Type 2 diabetes mellitus with other specified complication: Secondary | ICD-10-CM

## 2023-08-13 DIAGNOSIS — M79642 Pain in left hand: Secondary | ICD-10-CM | POA: Diagnosis not present

## 2023-08-13 DIAGNOSIS — I10 Essential (primary) hypertension: Secondary | ICD-10-CM

## 2023-08-13 DIAGNOSIS — R2232 Localized swelling, mass and lump, left upper limb: Secondary | ICD-10-CM | POA: Diagnosis not present

## 2023-08-13 NOTE — Progress Notes (Signed)
08/13/2023 Name: Nicholas Black MRN: [id number] DOB: [birth year]  Chief Complaint  Patient presents with   Medication Management   Medication Assistance    Nicholas Black is a 68 y.o. year old male who presented for a telephone visit.   They were referred to the pharmacist by their PCP for assistance in managing diabetes, hypertension, hyperlipidemia, and medication access.      Subjective:   Care Team: Primary Care Provider: Smitty Cords, DO ; Next Scheduled Visit: 09/18/2023 Nurse Care Manager: Juanell Fairly, RN Urology: [name], MD; Next Scheduled Visit: 10/12/2023  Medication Access/Adherence  Current Pharmacy:  [facility] - [place], [place] - [address] [place] [address] Phone: [phone] Fax: [phone]  [facility] [phone] [place], [place] - [address] [place] [address] Phone: [phone] Fax: [phone]   Patient reports affordability concerns with their medications: No  Patient reports access/transportation concerns to their pharmacy: No  Patient reports adherence concerns with their medications:  No   Patient using weekly pillbox; denies missed doses recently    Diabetes:   Current medications:  Ozempic 0.5 mg weekly on Tuesdays.  Reports tolerating well    Medications tried in the past: metformin ER   Denies checking home blood sugar recently   Statin: pravastatin 40 mg daily   Current physical activity: attending spin class for 45 minutes x 5 days/week   Current medication access support: Patient enrolled in patient assistance for Ozempic from [employer] through 07/23/2024   Hypertension:   Current medications:  - amlodipine 10 mg daily - losartan 50 mg daily   Reports has access to upper arm blood pressure monitor at home, but denies monitoring home blood pressure recently   Patient denies hypotensive s/sx including dizziness,  lightheadedness.    Current physical activity: attending spin class for 45 minutes x 5 days/week     Objective:  Lab Results  Component Value Date   HGBA1C 6.2 (A) 05/22/2023    Lab Results  Component Value Date   CREATININE 0.83 01/18/2023   BUN 16 01/18/2023   NA 144 01/18/2023   K 4.4 01/18/2023   CL 107 01/18/2023   CO2 28 01/18/2023    Lab Results  Component Value Date   CHOL 111 01/18/2023   HDL 80 01/18/2023   LDLCALC 17 01/18/2023   TRIG 65 01/18/2023   CHOLHDL 1.4 01/18/2023   BP Readings from Last 3 Encounters:  06/22/23 (!) 146/68  05/22/23 120/70  03/21/23 (!) 142/86   Pulse Readings from Last 3 Encounters:  06/22/23 83  05/22/23 73  03/21/23 92     Medications Reviewed Today     Reviewed by Manuela Neptune, RPH-CPP (Pharmacist) on 08/13/23 at 1519  Med List Status: <None>   Medication Order Taking? Sig Documenting Provider Last Dose Status Informant  amLODipine (NORVASC) 10 MG tablet [id number] Yes Take 1 tablet (10 mg total) by mouth daily. Smitty Cords, DO Taking Active   BAYER ASPIRIN EC LOW DOSE 81 MG tablet [id number]  1 tablet (81 mg total). Smitty Cords, DO  Active   Blood Glucose Monitoring Suppl DEVI [id number]  1 each by Does not apply route in the morning, at noon, and at bedtime. May substitute to any manufacturer covered by patient's insurance. Phineas Semen, MD  Active   finasteride (PROSCAR) 5 MG tablet [id number]  Take 1 tablet (5 mg total) by mouth daily. Karamalegos,  Netta Neat, DO  Active   lidocaine (LIDODERM) 5 % [id number]  Place 1 patch onto the skin every 12 (twelve) hours. Remove & Discard patch within 12 hours or as directed by MD Pilar Jarvis, MD  Active   losartan (COZAAR) 50 MG tablet [id number] Yes Take 1 tablet (50 mg total) by mouth daily. Smitty Cords, DO Taking Active   oxyCODONE (ROXICODONE) 5 MG immediate release tablet [id number]  Take 1 tablet (5 mg total) by mouth every 8  (eight) hours as needed for up to 12 doses. Pilar Jarvis, MD  Active   OZEMPIC, 0.25 OR 0.5 MG/DOSE, 2 MG/3ML SOPN [id number] Yes Inject 0.5 mg into the skin once a week. Smitty Cords, DO Taking Active   pravastatin (PRAVACHOL) 40 MG tablet [id number] Yes Take 1 tablet (40 mg total) by mouth daily. Smitty Cords, DO Taking Active   [facility] injection [id number]  Inject 0.5 mL into muscle for shingles vaccine. Repeat dose in 2-6 months. Smitty Cords, DO  Active               Assessment/Plan:   Encourage patient to continue using weekly pillbox as adherence aid   Again encourage patient to follow up with pharmacy regarding obtaining Shingrix vaccination series   Diabetes: - Have reviewed long term cardiovascular and renal outcomes of uncontrolled blood sugar - Encourage patient to restart monitoring home glucose and have record of results to review during upcoming appointments      Hypertension: - Have reviewed long term cardiovascular and renal outcomes of uncontrolled blood pressure - Have reviewed appropriate blood pressure monitoring technique and reviewed goal blood pressure.  - Have recommend to limit consumption of salt/sodium - Recommended to check home blood pressure and heart rate and have record of results to review during upcoming appointments      Follow Up Plan: Clinical Pharmacist will follow up with patient by telephone on 10/08/2023 at 10:00 AM    Estelle Grumbles, PharmD, Patsy [place], CPP Clinical Pharmacist [facility] [phone]

## 2023-08-13 NOTE — Patient Outreach (Signed)
  Care Management   Outreach Note  08/13/2023 Name: Nicholas Black MRN: [id number] DOB: 06-07-1956  An unsuccessful outreach attempt was made today for a scheduled Care Management visit.   Follow Up Plan:  Received automated voice prompt that caller is not available. Unable to leave a voice message. A member of the care team will make additional outreach attempts.  Katina Degree [facility] RN Care Manager Direct Dial: [phone] Website: [email or web address]

## 2023-08-13 NOTE — Patient Instructions (Signed)
Goals Addressed             This Visit's Progress    Pharmacy Goals       Our goal A1c is less than 7%. This corresponds with fasting sugars less than 130 and 2 hour after meal sugars less than 180. Please keep a log of your results when checking your blood sugar   Our goal bad cholesterol, or LDL, is less than 70 . This is why it is important to continue taking your pravastatin.  Check your blood pressure twice weekly, and any time you have concerning symptoms like headache, chest pain, dizziness, shortness of breath, or vision changes.   Our goal is less than 130/80.  To appropriately check your blood pressure, make sure you do the following:  1) Avoid caffeine, exercise, or tobacco products for 30 minutes before checking. Empty your bladder. 2) Sit with your back supported in a flat-backed chair. Rest your arm on something flat (arm of the chair, table, etc). 3) Sit still with your feet flat on the floor, resting, for at least 5 minutes.  4) Check your blood pressure. Take 1-2 readings.  5) Write down these readings and bring with you to any provider appointments.  Bring your home blood pressure machine with you to a provider's office for accuracy comparison at least once a year.   Make sure you take your blood pressure medications before you come to any office visit, even if you were asked to fast for labs.  Wallace Cullens, PharmD, Para March, CPP Clinical Pharmacist [facility] [phone]

## 2023-08-13 NOTE — Telephone Encounter (Signed)
Pt has been APPROVE for [employer] (Ozempic),send my chart msg letting him know it was approve.

## 2023-08-23 DIAGNOSIS — S62303D Unspecified fracture of third metacarpal bone, left hand, subsequent encounter for fracture with routine healing: Secondary | ICD-10-CM | POA: Diagnosis not present

## 2023-08-30 DIAGNOSIS — M25642 Stiffness of left hand, not elsewhere classified: Secondary | ICD-10-CM | POA: Diagnosis not present

## 2023-09-04 DIAGNOSIS — J101 Influenza due to other identified influenza virus with other respiratory manifestations: Secondary | ICD-10-CM | POA: Diagnosis not present

## 2023-09-04 DIAGNOSIS — Z03818 Encounter for observation for suspected exposure to other biological agents ruled out: Secondary | ICD-10-CM | POA: Diagnosis not present

## 2023-09-14 DIAGNOSIS — M25642 Stiffness of left hand, not elsewhere classified: Secondary | ICD-10-CM | POA: Diagnosis not present

## 2023-09-18 ENCOUNTER — Ambulatory Visit (INDEPENDENT_AMBULATORY_CARE_PROVIDER_SITE_OTHER): Payer: Medicare HMO | Admitting: Family Medicine

## 2023-09-18 ENCOUNTER — Other Ambulatory Visit: Payer: Self-pay | Admitting: Family Medicine

## 2023-09-18 ENCOUNTER — Encounter: Payer: Self-pay | Admitting: Family Medicine

## 2023-09-18 VITALS — BP 132/80 | HR 78 | Ht 68.0 in | Wt 197.0 lb

## 2023-09-18 DIAGNOSIS — G8929 Other chronic pain: Secondary | ICD-10-CM | POA: Diagnosis not present

## 2023-09-18 DIAGNOSIS — M25511 Pain in right shoulder: Secondary | ICD-10-CM

## 2023-09-18 DIAGNOSIS — N401 Enlarged prostate with lower urinary tract symptoms: Secondary | ICD-10-CM

## 2023-09-18 DIAGNOSIS — E1169 Type 2 diabetes mellitus with other specified complication: Secondary | ICD-10-CM

## 2023-09-18 DIAGNOSIS — I10 Essential (primary) hypertension: Secondary | ICD-10-CM

## 2023-09-18 DIAGNOSIS — M19012 Primary osteoarthritis, left shoulder: Secondary | ICD-10-CM

## 2023-09-18 DIAGNOSIS — M19011 Primary osteoarthritis, right shoulder: Secondary | ICD-10-CM

## 2023-09-18 DIAGNOSIS — Z7985 Long-term (current) use of injectable non-insulin antidiabetic drugs: Secondary | ICD-10-CM

## 2023-09-18 DIAGNOSIS — M25512 Pain in left shoulder: Secondary | ICD-10-CM | POA: Diagnosis not present

## 2023-09-18 DIAGNOSIS — Z0000 Encounter for general adult medical examination without abnormal findings: Secondary | ICD-10-CM

## 2023-09-18 LAB — POCT GLYCOSYLATED HEMOGLOBIN (HGB A1C): Hemoglobin A1C: 6.4 % — AB (ref 4.0–5.6)
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
## 2023-09-18 NOTE — Patient Instructions (Addendum)
 Thank you for coming to the office today.  Recent Labs    01/18/23 0753 05/22/23 0913 09/18/23 0851  HGBA1C 6.8* 6.2* 6.4*   Ozempic 0.5mg  weekly for now in future I will ask Gentry Fitz about dose increase to 1mg   Referral to Orthopedics for shoulders  [facility] [employer] [facility] Address: [address] [place], [place], [place] [address] Hours:  9AM-5PM Phone: [phone]  DUE for FASTING BLOOD WORK (no food or drink after midnight before the lab appointment, only water or coffee without cream/sugar on the morning of)  SCHEDULE "Lab Only" visit in the morning at the clinic for lab draw in 4 MONTHS   - Make sure Lab Only appointment is at about 1 week before your next appointment, so that results will be available  For Lab Results, once available within 2-3 days of blood draw, you can can log in to MyChart online to view your results and a brief explanation. Also, we can discuss results at next follow-up visit.   Please schedule a Follow-up Appointment to: Return in about 4 months (around 01/16/2024) for 4 month fasting lab > 1 week later Annual Physical.  If you have any other questions or concerns, please feel free to call the office or send a message through MyChart. You may also schedule an earlier appointment if necessary.  Additionally, you may be receiving a survey about your experience at our office within a few days to 1 week by e-mail or mail. We value your feedback.  Saralyn Pilar, DO [facility], [place]

## 2023-09-19 ENCOUNTER — Other Ambulatory Visit: Payer: Self-pay | Admitting: Family Medicine

## 2023-09-19 DIAGNOSIS — E1169 Type 2 diabetes mellitus with other specified complication: Secondary | ICD-10-CM

## 2023-09-20 ENCOUNTER — Telehealth: Payer: Self-pay | Admitting: *Deleted

## 2023-09-20 DIAGNOSIS — M25642 Stiffness of left hand, not elsewhere classified: Secondary | ICD-10-CM | POA: Diagnosis not present

## 2023-09-20 DIAGNOSIS — S62303D Unspecified fracture of third metacarpal bone, left hand, subsequent encounter for fracture with routine healing: Secondary | ICD-10-CM | POA: Diagnosis not present

## 2023-09-20 NOTE — Progress Notes (Signed)
 Complex Care Management Care Guide Note  09/20/2023 Name: ROBERTH BERLING MRN: [id number] DOB: 1956-06-20  BRAYLAN FAUL is a 68 y.o. year old male who is a primary care patient of Smitty Cords, DO and is actively engaged with the care management team. I reached out to [name] by phone today to assist with re-scheduling  with the RN Case Manager.  Follow up plan: Unsuccessful telephone outreach attempt made. A HIPAA compliant phone message was left for the patient providing contact information and requesting a return call.  [name], CMA, Care Guide [facility]  [facility], [facility] Guide Direct Dial: [phone]  Fax: [phone] Website: [email or web address]

## 2023-09-26 DIAGNOSIS — M25511 Pain in right shoulder: Secondary | ICD-10-CM | POA: Diagnosis not present

## 2023-09-26 NOTE — Progress Notes (Signed)
 Complex Care Management Care Guide Note  09/26/2023 Name: Nicholas Black MRN: [id number] DOB: 15-Mar-1956  NATION CRADLE is a 68 y.o. year old male who is a primary care patient of Smitty Cords, DO and is actively engaged with the care management team. I reached out to [name] by phone today to assist with re-scheduling  with the RN Case Manager.  Follow up plan: Unsuccessful telephone outreach attempt made. A HIPAA compliant phone message was left for the patient providing contact information and requesting a return call.No additional outreach attempts will be made due to inability to maintain patient contact.   [name], CMA, Care Guide [facility]  [facility], [facility] Guide Direct Dial: [phone]  Fax: [phone] Website: [email or web address]

## 2023-10-08 ENCOUNTER — Encounter: Payer: Self-pay | Admitting: Pharmacist

## 2023-10-08 ENCOUNTER — Other Ambulatory Visit: Payer: Self-pay | Admitting: *Deleted

## 2023-10-08 ENCOUNTER — Other Ambulatory Visit: Payer: Medicare HMO | Admitting: Pharmacist

## 2023-10-08 DIAGNOSIS — E1169 Type 2 diabetes mellitus with other specified complication: Secondary | ICD-10-CM

## 2023-10-08 DIAGNOSIS — Z7985 Long-term (current) use of injectable non-insulin antidiabetic drugs: Secondary | ICD-10-CM

## 2023-10-08 DIAGNOSIS — I10 Essential (primary) hypertension: Secondary | ICD-10-CM

## 2023-10-08 DIAGNOSIS — R972 Elevated prostate specific antigen [PSA]: Secondary | ICD-10-CM

## 2023-10-08 NOTE — Patient Instructions (Addendum)
 Goals Addressed             This Visit's Progress    Pharmacy Goals       If you need to reach out to the Ozempic patient assistance program regarding refills or to find out the status of your application, you can do so by calling:   [employer] at [phone]     Also, if you have not already done so, please consider following up with your pharmacy regarding getting the Shingrix vaccine for prevention of shingles.  Our goal A1c is less than 7%. This corresponds with fasting sugars less than 130 and 2 hour after meal sugars less than 180. Please keep a log of your results when checking your blood sugar   Our goal bad cholesterol, or LDL, is less than 70 . This is why it is important to continue taking your pravastatin.  Check your blood pressure twice weekly, and any time you have concerning symptoms like headache, chest pain, dizziness, shortness of breath, or vision changes.   Our goal is less than 130/80.  To appropriately check your blood pressure, make sure you do the following:  1) Avoid caffeine, exercise, or tobacco products for 30 minutes before checking. Empty your bladder. 2) Sit with your back supported in a flat-backed chair. Rest your arm on something flat (arm of the chair, table, etc). 3) Sit still with your feet flat on the floor, resting, for at least 5 minutes.  4) Check your blood pressure. Take 1-2 readings.  5) Write down these readings and bring with you to any provider appointments.  Bring your home blood pressure machine with you to a provider's office for accuracy comparison at least once a year.   Make sure you take your blood pressure medications before you come to any office visit, even if you were asked to fast for labs.   Estelle Grumbles, PharmD, Patsy [place], CPP Clinical Pharmacist [facility] [phone]

## 2023-10-08 NOTE — Progress Notes (Signed)
 10/08/2023 Name: Nicholas Black MRN: [id number] DOB: 1956/02/16  Chief Complaint  Patient presents with   Medication Management   Medication Assistance    Nicholas Black is a 68 y.o. year old male who presented for a telephone visit.   They were referred to the pharmacist by their PCP for assistance in managing diabetes, hypertension, hyperlipidemia, and medication access.      Subjective:   Care Team: Primary Care Provider: Smitty Cords, DO ; Next Scheduled Visit: 01/23/2024 Nurse Care Manager: Juanell Fairly, RN Urology: [name], MD; Next Scheduled Visit: 10/12/2023  Medication Access/Adherence  Current Pharmacy:  [facility] - [place], [place] - [address] [place] [address] Phone: [phone] Fax: [phone]  [facility] [phone] [place], [place] - [address] [place] [address] Phone: [phone] Fax: [phone]   Patient reports affordability concerns with their medications: No  Patient reports access/transportation concerns to their pharmacy: No  Patient reports adherence concerns with their medications:  No   Patient using weekly pillbox; denies missed doses recently    Diabetes:   Current medications:  Ozempic 0.5 mg weekly on Tuesdays.  Reports tolerating well; planning to increase to Ozempic 1 mg weekly as soon as he receives this dose from patient assistance program. Note PCP sent new order to program ~09/21/2023   Medications tried in the past: metformin ER   Denies checking home blood sugar recently   Statin: pravastatin 40 mg daily   Current physical activity: attending spin class for 45 minutes x 4-5 days/week   Current medication access support: Patient enrolled in patient assistance for Ozempic from [employer] through 07/23/2024   Hypertension:   Current medications:  - amlodipine 10 mg daily - losartan 50 mg daily   Reports has access to upper  arm blood pressure monitor at home, but denies monitoring home blood pressure recently   Patient denies hypotensive s/sx including dizziness, lightheadedness.    Current physical activity: attending spin class for 45 minutes x 4-5 days/week     Objective:  Lab Results  Component Value Date   HGBA1C 6.4 (A) 09/18/2023    Lab Results  Component Value Date   CREATININE 0.83 01/18/2023   BUN 16 01/18/2023   NA 144 01/18/2023   K 4.4 01/18/2023   CL 107 01/18/2023   CO2 28 01/18/2023    Lab Results  Component Value Date   CHOL 111 01/18/2023   HDL 80 01/18/2023   LDLCALC 17 01/18/2023   TRIG 65 01/18/2023   CHOLHDL 1.4 01/18/2023   BP Readings from Last 3 Encounters:  09/18/23 132/80  06/22/23 (!) 146/68  05/22/23 120/70   Pulse Readings from Last 3 Encounters:  09/18/23 78  06/22/23 83  05/22/23 73    Medications Reviewed Today     Reviewed by Manuela Neptune, RPH-CPP (Pharmacist) on 10/08/23 at 1009  Med List Status: <None>   Medication Order Taking? Sig Documenting Provider Last Dose Status Informant  amLODipine (NORVASC) 10 MG tablet [id number] Yes Take 1 tablet (10 mg total) by mouth daily. Smitty Cords, DO Taking Active   BAYER ASPIRIN EC LOW DOSE 81 MG tablet [id number]  1 tablet (81 mg total). Smitty Cords, DO  Active   Blood Glucose Monitoring Suppl DEVI [id number]  1 each by Does not apply route in the morning, at noon, and at bedtime. May substitute to any manufacturer covered by patient's insurance.  Phineas Semen, MD  Active   finasteride (PROSCAR) 5 MG tablet [id number]  Take 1 tablet (5 mg total) by mouth daily. Karamalegos, Netta Neat, DO  Active   lidocaine (LIDODERM) 5 % [id number]  Place 1 patch onto the skin every 12 (twelve) hours. Remove & Discard patch within 12 hours or as directed by MD Pilar Jarvis, MD  Active   losartan (COZAAR) 50 MG tablet [id number] Yes Take 1 tablet (50 mg total) by mouth daily. Smitty Cords, DO Taking Active   oxyCODONE (ROXICODONE) 5 MG immediate release tablet [id number]  Take 1 tablet (5 mg total) by mouth every 8 (eight) hours as needed for up to 12 doses. Pilar Jarvis, MD  Active   [facility], 1 MG/DOSE, 4 MG/3ML Namon Cirri [id number]  Inject 1 mg into the skin once a week. Smitty Cords, DO  Active   pravastatin (PRAVACHOL) 40 MG tablet [id number] Yes Take 1 tablet (40 mg total) by mouth daily. Smitty Cords, DO Taking Active   [facility] injection [id number]  Inject 0.5 mL into muscle for shingles vaccine. Repeat dose in 2-6 months. Smitty Cords, DO  Active               Assessment/Plan:   Encourage patient to continue using weekly pillbox as adherence aid   Again encourage patient to follow up with pharmacy regarding obtaining Shingrix vaccination series   Diabetes: - Have reviewed long term cardiovascular and renal outcomes of uncontrolled blood sugar - Encourage patient to restart monitoring home glucose and have record of results to review during upcoming appointments      Hypertension: - Have reviewed long term cardiovascular and renal outcomes of uncontrolled blood pressure - Have reviewed appropriate blood pressure monitoring technique and reviewed goal blood pressure.  - Have recommend to limit consumption of salt/sodium - Recommended to check home blood pressure and heart rate and have record of results to review during upcoming appointments      Follow Up Plan: Clinical Pharmacist will follow up with patient by telephone on 12/03/2023 at 10:00 AM    Estelle Grumbles, PharmD, Patsy Baltimore, CPP Clinical Pharmacist [facility] [phone]

## 2023-10-11 ENCOUNTER — Other Ambulatory Visit: Payer: Medicare HMO

## 2023-10-11 DIAGNOSIS — R972 Elevated prostate specific antigen [PSA]: Secondary | ICD-10-CM | POA: Diagnosis not present

## 2023-10-12 ENCOUNTER — Ambulatory Visit: Admitting: Urology

## 2023-10-12 ENCOUNTER — Ambulatory Visit: Payer: Self-pay | Admitting: Urology

## 2023-10-12 ENCOUNTER — Encounter: Payer: Self-pay | Admitting: Urology

## 2023-10-12 VITALS — BP 133/86 | HR 89 | Ht 69.0 in | Wt 192.0 lb

## 2023-10-12 DIAGNOSIS — Z125 Encounter for screening for malignant neoplasm of prostate: Secondary | ICD-10-CM

## 2023-10-12 DIAGNOSIS — N401 Enlarged prostate with lower urinary tract symptoms: Secondary | ICD-10-CM | POA: Diagnosis not present

## 2023-10-12 LAB — PSA: Prostate Specific Ag, Serum: 2 ng/mL (ref 0.0–4.0)

## 2023-10-12 NOTE — Patient Instructions (Signed)
 Prostate Cancer Screening  Prostate cancer screening is testing that is done to check for the presence of prostate cancer in men. The prostate gland is a walnut-sized gland that is located below the bladder and in front of the rectum in males. The function of the prostate is to add fluid to semen during ejaculation. Prostate cancer is one of the most common types of cancer in men. Who should have prostate cancer screening? Screening recommendations vary based on age and other risk factors, as well as between the professional organizations who make the recommendations. In general, screening is recommended if: You are age 59 to 46 and have an average risk for prostate cancer. You should talk with your health care provider about your need for screening and how often screening should be done. Because most prostate cancers are slow growing and will not cause death, screening in this age group is generally reserved for men who have a 10- to 15-year life expectancy. You are younger than age 64, and you have these risk factors: Having a father, brother, or uncle who has been diagnosed with prostate cancer. The risk is higher if your family member's cancer occurred at an early age or if you have multiple family members with prostate cancer at an early age. Being a male who is [place] or is of [place] or sub-Saharan African descent. In general, screening is not recommended if: You are younger than age 71. You are between the ages of 93 and 30 and you have no risk factors. You are 74 years of age or older. At this age, the risks that screening can cause are greater than the benefits that it may provide. If you are at high risk for prostate cancer, your health care provider may recommend that you have screenings more often or that you start screening at a younger age. How is screening for prostate cancer done? The recommended prostate cancer screening test is a blood test called the prostate-specific antigen  (PSA) test. PSA is a protein that is made in the prostate. As you age, your prostate naturally produces more PSA. Abnormally high PSA levels may be caused by: Prostate cancer. An enlarged prostate that is not caused by cancer (benign prostatic hyperplasia, or BPH). This condition is very common in older men. A prostate gland infection (prostatitis) or urinary tract infection. Certain medicines such as male hormones (like testosterone) or other medicines that raise testosterone levels. A rectal exam may be done as part of prostate cancer screening to help provide information about the size of your prostate gland. When a rectal exam is performed, it should be done after the PSA level is drawn to avoid any effect on the results. Depending on the PSA results, you may need more tests, such as: A physical exam to check the size of your prostate gland, if not done as part of screening. Blood and imaging tests. A procedure to remove tissue samples from your prostate gland for testing (biopsy). This is the only way to know for certain if you have prostate cancer. What are the benefits of prostate cancer screening? Screening can help to identify cancer at an early stage, before symptoms start and when the cancer can be treated more easily. There is a small chance that screening may lower your risk of dying from prostate cancer. The chance is small because prostate cancer is a slow-growing cancer, and most men with prostate cancer die from a different cause. What are the risks of prostate cancer screening? The  main risk of prostate cancer screening is diagnosing and treating prostate cancer that would never have caused any symptoms or problems. This is called overdiagnosisand overtreatment. PSA screening cannot tell you if your PSA is high due to cancer or a different cause. A prostate biopsy is the only procedure to diagnose prostate cancer. Even the results of a biopsy may not tell you if your cancer needs to  be treated. Slow-growing prostate cancer may not need any treatment other than monitoring, so diagnosing and treating it may cause unnecessary stress or other side effects. Questions to ask your health care provider When should I start prostate cancer screening? What is my risk for prostate cancer? How often do I need screening? What type of screening tests do I need? How do I get my test results? What do my results mean? Do I need treatment? Where to find more information The American Cancer Society: www.cancer.org American Urological Association: www.auanet.org Contact a health care provider if: You have difficulty urinating. You have pain when you urinate or ejaculate. You have blood in your urine or semen. You have pain in your back or in the area of your prostate. Summary Prostate cancer is a common type of cancer in men. The prostate gland is located below the bladder and in front of the rectum. This gland adds fluid to semen during ejaculation. Prostate cancer screening may identify cancer at an early stage, when the cancer can be treated more easily and is less likely to have spread to other areas of the body. The prostate-specific antigen (PSA) test is the recommended screening test for prostate cancer, but it has associated risks. Discuss the risks and benefits of prostate cancer screening with your health care provider. If you are age 73 or older, the risks that screening can cause are greater than the benefits that it may provide. This information is not intended to replace advice given to you by your health care provider. Make sure you discuss any questions you have with your health care provider. Document Revised: 01/03/2021 Document Reviewed: 01/03/2021 Elsevier Patient Education  2024 [employer].

## 2023-10-12 NOTE — Progress Notes (Signed)
 I, Nicholas Black, acting as a scribe for Nicholas Altes, MD., have documented all relevant documentation on the behalf of Nicholas Altes, MD, as directed by Nicholas Altes, MD while in the presence of Nicholas Altes, MD.  10/12/2023 10:19 AM   Nicholas Black 20-Jun-1956 [id number]  Referring provider: Smitty Cords, DO [address] [place],  [place] [address]  Chief Complaint  Patient presents with   Follow-up   Urologic history: 1. BPH with LUTS   2. Elevated PSA  HPI: Nicholas Black is a 68 y.o. male presents for a 6 month follow-up visit.   Initially seen 02/11/2023 for BPH He has been on finasteride since 2021 and a uncorrected PSA was elevated at 3.8. This was repeated in September 2024 and was normal at 2.0. A 6 month follow-up visit was recommended. Since his last visit, he states he is doing well and has no complaints.  PSA drawn 10/11/23 remains stable at 2.0 (uncorrected)   PSA trend  Prostate Specific Ag, Serum  Latest Ref Rng 0.0 - 4.0 ng/mL  04/10/2023 2.0   10/11/2023 2.0      PMH: Past Medical History:  Diagnosis Date   Hypercholesteremia    Hypertension     Surgical History: Past Surgical History:  Procedure Laterality Date   COLONOSCOPY WITH PROPOFOL N/A 02/10/2020   Procedure: COLONOSCOPY WITH PROPOFOL;  Surgeon: Pasty Spillers, MD;  Location: [facility] ENDOSCOPY;  Service: Endoscopy;  Laterality: N/A;   NO PAST SURGERIES      Home Medications:  Allergies as of 10/12/2023   No Known Allergies      Medication List        Accurate as of October 12, 2023 10:19 AM. If you have any questions, ask your nurse or doctor.          amLODipine 10 MG tablet Commonly known as: NORVASC Take 1 tablet (10 mg total) by mouth daily.   Bayer Aspirin EC Low Dose 81 MG tablet Generic drug: aspirin EC 1 tablet (81 mg total).   Blood Glucose Monitoring Suppl Devi 1 each by Does not apply route in the morning, at noon, and at bedtime. May  substitute to any manufacturer covered by patient's insurance.   finasteride 5 MG tablet Commonly known as: PROSCAR Take 1 tablet (5 mg total) by mouth daily.   lidocaine 5 % Commonly known as: Lidoderm Place 1 patch onto the skin every 12 (twelve) hours. Remove & Discard patch within 12 hours or as directed by MD   losartan 50 MG tablet Commonly known as: COZAAR Take 1 tablet (50 mg total) by mouth daily.   oxyCODONE 5 MG immediate release tablet Commonly known as: Roxicodone Take 1 tablet (5 mg total) by mouth every 8 (eight) hours as needed for up to 12 doses.   Ozempic (1 MG/DOSE) 4 MG/3ML Sopn Generic drug: Semaglutide (1 MG/DOSE) Inject 1 mg into the skin once a week.   pravastatin 40 MG tablet Commonly known as: PRAVACHOL Take 1 tablet (40 mg total) by mouth daily.   Shingrix injection Generic drug: Zoster Vaccine Adjuvanted Inject 0.5 mL into muscle for shingles vaccine. Repeat dose in 2-6 months.        Allergies: No Known Allergies  Family History: Family History  Problem Relation Age of Onset   Heart disease Mother    Pancreatic cancer Brother     Social History:  reports that he quit smoking about 9 years  ago. His smoking use included cigarettes. He started smoking about 29 years ago. He has a 20 pack-year smoking history. He has quit using smokeless tobacco. He reports current alcohol use of about 3.0 standard drinks of alcohol per week. He reports that he does not use drugs.   Physical Exam: BP 133/86   Pulse 89   Ht 5\' 9"  (1.753 m)   Wt 192 lb (87.1 kg)   BMI 28.35 kg/m   Constitutional:  Alert and oriented, No acute distress. HEENT: [place] AT Respiratory: Normal respiratory effort, no increased work of breathing. Psychiatric: Normal mood and affect.   Assessment & Plan:    1. BPH with LUTS Stable lower urinary tract symptoms on finasteride, which is prescribed by his PCP.   2. Prostate cancer screening Corrected PSA stable at 4.0 1 year  followed with PSA.  I have reviewed the above documentation for accuracy and completeness, and I agree with the above.   Nicholas Altes, MD  [facility] [address], [address] [place], [place] [address] [phone]

## 2023-12-03 ENCOUNTER — Other Ambulatory Visit: Admitting: Pharmacist

## 2023-12-03 DIAGNOSIS — E1169 Type 2 diabetes mellitus with other specified complication: Secondary | ICD-10-CM

## 2023-12-03 DIAGNOSIS — I10 Essential (primary) hypertension: Secondary | ICD-10-CM

## 2023-12-03 DIAGNOSIS — Z7985 Long-term (current) use of injectable non-insulin antidiabetic drugs: Secondary | ICD-10-CM

## 2023-12-03 NOTE — Patient Instructions (Signed)
 Goals Addressed             This Visit's Progress    Pharmacy Goals       If you need to reach out to the Ozempic  patient assistance program regarding refills or to find out the status of your application, you can do so by calling:   [employer] at [phone]    Our goal A1c is less than 7%. This corresponds with fasting sugars less than 130 and 2 hour after meal sugars less than 180. Please keep a log of your results when checking your blood sugar   Our goal bad cholesterol, or LDL, is less than 70 . This is why it is important to continue taking your pravastatin .  Check your blood pressure twice weekly, and any time you have concerning symptoms like headache, chest pain, dizziness, shortness of breath, or vision changes.   Our goal is less than 130/80.  To appropriately check your blood pressure, make sure you do the following:  1) Avoid caffeine, exercise, or tobacco products for 30 minutes before checking. Empty your bladder. 2) Sit with your back supported in a flat-backed chair. Rest your arm on something flat (arm of the chair, table, etc). 3) Sit still with your feet flat on the floor, resting, for at least 5 minutes.  4) Check your blood pressure. Take 1-2 readings.  5) Write down these readings and bring with you to any provider appointments.  Bring your home blood pressure machine with you to a provider's office for accuracy comparison at least once a year.   Make sure you take your blood pressure medications before you come to any office visit, even if you were asked to fast for labs.   Arthur Lash, PharmD, Becky Bowels, CPP Clinical Pharmacist [facility] [phone]

## 2023-12-03 NOTE — Progress Notes (Signed)
 12/03/2023 Name: Nicholas Black MRN: [id number] DOB: 1955/11/11  Chief Complaint  Patient presents with   Medication Management   Medication Assistance   Medication Adherence    KHRISTOPHER GONNERMAN is a 68 y.o. year old male who presented for a telephone visit.   They were referred to the pharmacist by their PCP for assistance in managing diabetes, hypertension, hyperlipidemia, and medication access.      Subjective:   Care Team: Primary Care Provider: Raina Bunting, DO ; Next Scheduled Visit: 01/23/2024 Urology: Geraline Knapp, MD  Medication Access/Adherence  Current Pharmacy:  [facility] - [place], [place] - [address] [place] [address] Phone: [phone] Fax: [phone]  [id number] [place], [place] - [address] [place] [address] Phone: [phone] Fax: [phone]   Patient reports affordability concerns with their medications: No  Patient reports access/transportation concerns to their pharmacy: No  Patient reports adherence concerns with their medications:  No   Patient using weekly pillbox; denies missed doses recently    Diabetes:   Current medications:  Ozempic  1 mg weekly on Tuesdays    Medications tried in the past: metformin  ER   Denies checking home blood sugar recently   Statin: pravastatin  40 mg daily   Current physical activity: attending spin class for 45 minutes x 4-5 days/week   Current medication access support: Patient enrolled in patient assistance for Ozempic  from [employer] through 07/23/2024   Hypertension:   Current medications:  - amlodipine  10 mg daily - losartan  50 mg daily   Reports has access to upper arm blood pressure monitor at home, but denies monitoring home blood pressure recently   Patient denies hypotensive s/sx including dizziness, lightheadedness.    Current physical activity: attending spin class for 45 minutes x 4-5  days/week       Objective:  Lab Results  Component Value Date   HGBA1C 6.4 (A) 09/18/2023    Lab Results  Component Value Date   CREATININE 0.83 01/18/2023   BUN 16 01/18/2023   NA 144 01/18/2023   K 4.4 01/18/2023   CL 107 01/18/2023   CO2 28 01/18/2023    Lab Results  Component Value Date   CHOL 111 01/18/2023   HDL 80 01/18/2023   LDLCALC 17 01/18/2023   TRIG 65 01/18/2023   CHOLHDL 1.4 01/18/2023   BP Readings from Last 3 Encounters:  10/12/23 133/86  09/18/23 132/80  06/22/23 (!) 146/68   Pulse Readings from Last 3 Encounters:  10/12/23 89  09/18/23 78  06/22/23 83    Medications Reviewed Today     Reviewed by Ardis Becton, RPH-CPP (Pharmacist) on 12/03/23 at 1005  Med List Status: <None>   Medication Order Taking? Sig Documenting Provider Last Dose Status Informant  amLODipine  (NORVASC ) 10 MG tablet [id number] Yes Take 1 tablet (10 mg total) by mouth daily. Raina Bunting, DO Taking Active   BAYER ASPIRIN EC LOW DOSE 81 MG tablet [id number]  1 tablet (81 mg total). Raina Bunting, DO  Active   Blood Glucose Monitoring Suppl DEVI [id number]  1 each by Does not apply route in the morning, at noon, and at bedtime. May substitute to any manufacturer covered by patient's insurance. Marylynn Soho, MD  Active   finasteride  (PROSCAR ) 5 MG tablet [id number] Yes Take 1 tablet (5 mg total) by mouth daily. Raina Bunting, DO Taking Active   lidocaine  (LIDODERM ) 5 %  [id number]  Place 1 patch onto the skin every 12 (twelve) hours. Remove & Discard patch within 12 hours or as directed by MD Buell Carmin, MD  Active   losartan  (COZAAR ) 50 MG tablet [id number] Yes Take 1 tablet (50 mg total) by mouth daily. Raina Bunting, DO Taking Active   OZEMPIC , 1 MG/DOSE, 4 MG/3ML SOPN [id number] Yes Inject 1 mg into the skin once a week. Raina Bunting, DO Taking Active   pravastatin  (PRAVACHOL ) 40 MG tablet [id number] Yes Take 1  tablet (40 mg total) by mouth daily. Raina Bunting, DO Taking Active   SHINGRIX  injection [id number]  Inject 0.5 mL into muscle for shingles vaccine. Repeat dose in 2-6 months. Raina Bunting, DO  Active               Assessment/Plan:   Encourage patient to continue using weekly pillbox as adherence aid   Reports received 1st dose of Shingrix  vaccination series and scheduled to receive 2nd dose this month   Diabetes: - Have reviewed long term cardiovascular and renal outcomes of uncontrolled blood sugar - Encourage patient to restart monitoring home glucose and have record of results to review during upcoming appointments      Hypertension: - Have reviewed long term cardiovascular and renal outcomes of uncontrolled blood pressure - Have reviewed appropriate blood pressure monitoring technique and reviewed goal blood pressure.  - Have recommend to limit consumption of salt/sodium - Recommended to check home blood pressure and heart rate and have record of results to review during upcoming appointments      Follow Up Plan: Clinical Pharmacist will follow up with patient by telephone on 03/10/2024 at 10:00 AM    Arthur Lash, PharmD, Becky Bowels, CPP Clinical Pharmacist [facility] [phone]

## 2024-01-16 ENCOUNTER — Other Ambulatory Visit: Payer: Medicare HMO

## 2024-01-16 DIAGNOSIS — N138 Other obstructive and reflux uropathy: Secondary | ICD-10-CM

## 2024-01-16 DIAGNOSIS — I10 Essential (primary) hypertension: Secondary | ICD-10-CM

## 2024-01-16 DIAGNOSIS — Z0000 Encounter for general adult medical examination without abnormal findings: Secondary | ICD-10-CM

## 2024-01-16 DIAGNOSIS — E1169 Type 2 diabetes mellitus with other specified complication: Secondary | ICD-10-CM

## 2024-01-23 ENCOUNTER — Encounter: Payer: Medicare HMO | Admitting: Family Medicine

## 2024-01-29 ENCOUNTER — Telehealth: Payer: Self-pay

## 2024-01-29 ENCOUNTER — Encounter: Payer: Self-pay | Admitting: Family Medicine

## 2024-01-29 ENCOUNTER — Ambulatory Visit (INDEPENDENT_AMBULATORY_CARE_PROVIDER_SITE_OTHER): Admitting: Family Medicine

## 2024-01-29 VITALS — BP 128/80 | HR 75 | Ht 69.0 in | Wt 190.8 lb

## 2024-01-29 DIAGNOSIS — N401 Enlarged prostate with lower urinary tract symptoms: Secondary | ICD-10-CM | POA: Diagnosis not present

## 2024-01-29 DIAGNOSIS — R972 Elevated prostate specific antigen [PSA]: Secondary | ICD-10-CM

## 2024-01-29 DIAGNOSIS — Z7985 Long-term (current) use of injectable non-insulin antidiabetic drugs: Secondary | ICD-10-CM

## 2024-01-29 DIAGNOSIS — I10 Essential (primary) hypertension: Secondary | ICD-10-CM

## 2024-01-29 DIAGNOSIS — N138 Other obstructive and reflux uropathy: Secondary | ICD-10-CM

## 2024-01-29 DIAGNOSIS — Z0000 Encounter for general adult medical examination without abnormal findings: Secondary | ICD-10-CM | POA: Diagnosis not present

## 2024-01-29 DIAGNOSIS — E785 Hyperlipidemia, unspecified: Secondary | ICD-10-CM

## 2024-01-29 DIAGNOSIS — E1169 Type 2 diabetes mellitus with other specified complication: Secondary | ICD-10-CM | POA: Diagnosis not present

## 2024-01-29 NOTE — Progress Notes (Signed)
 Subjective:    Patient ID: Nicholas Black, male    DOB: 04/06/1956, 68 y.o.   MRN: [id number]  Nicholas Black is a 68 y.o. male presenting on 01/29/2024 for Annual Exam   HPI  Discussed the use of AI scribe software for clinical note transcription with the patient, who gave verbal consent to proceed.  History of Present Illness   [name] is a 68 year old male who presents for an annual physical exam.  HYPERLIPIDEMIA: - Reports no concerns. Last lipid panel 2024, controlled LDL 17 - Currently taking Pravastatin  40mg , tolerating well without side effects or myalgias  Prostate cancer surveillance - PSA monitored by urology - Next PSA check scheduled for March 2026  Tobacco use history - Former smoker; quit in 2016 after smoking one pack per day for twenty years  CHRONIC DM, Type 2: Doing well He receives medicine through the patient assistance program working with clinical pharmacy Meds: Ozempic  1mg  weekly inj - Off Metformin   Reports  good compliance. Tolerating well w/o side-effects Currently on ARB Diet improved lower carb sugar now, less tea, less fried - Attends spin classes six days per week - Weight loss of seven pounds since February 2025 Denies hypoglycemia, polyuria, visual changes   CHRONIC HTN: Home BP readings controlled. Today normal BP Current Meds - Amlodipine  10mg  daily, Losartan  50mg  daily Tolerating well, w/o complaints. Denies HA, chest pain, syncope, lightheaded, dizzy  BPH, LUTS Doing well on Finasteride  5mg  Followed by [facility]   PMH Generalized Anxiety with Panic    Health Maintenance:   Colon CA Screening: Never had colonoscopy or screening. Currently asymptomatic. No known family history of colon CA. Completed Colonoscopy 02/10/20 - next due 2026 (5 years)   Prostate CA Screening: PSA elevated to 3.8 (12/2022), prior range 1.1 (2 yr ago), he is on Finasteride  for past few years since 2021.    Considering CT screening / Need Lung  Screening, Former smoker 1ppd 20 years, quit 2016  Shingrix  completed 2 doses.     04/05/2023   12:07 PM 10/20/2022    1:51 PM 10/02/2022    2:58 PM  Depression screen PHQ 2/9  Decreased Interest 0 0 0  Down, Depressed, Hopeless 0 0 0  PHQ - 2 Score 0 0 0  Altered sleeping 0  2  Tired, decreased energy 0  2  Change in appetite 0  0  Feeling bad or failure about yourself  0  0  Trouble concentrating 0  0  Moving slowly or fidgety/restless 0  1  Suicidal thoughts 0  0  PHQ-9 Score 0  5  Difficult doing work/chores Not difficult at all  Somewhat difficult       10/02/2022    2:58 PM 09/28/2022    9:52 AM 03/28/2022   10:30 AM 01/17/2021   10:57 AM  GAD 7 : Generalized Anxiety Score  Nervous, Anxious, on Edge 1 1 0 1  Control/stop worrying 0 0 0 2  Worry too much - different things 1 0 0 1  Trouble relaxing 0 0 1 2  Restless 0 0 1 2  Easily annoyed or irritable 0 1 1 2   Afraid - awful might happen 0 1 0 1  Total GAD 7 Score 2 3 3 11   Anxiety Difficulty Not difficult at all Not difficult at all Not difficult at all Not difficult at all     Past Medical History:  Diagnosis Date   Hypercholesteremia  Hypertension    Past Surgical History:  Procedure Laterality Date   COLONOSCOPY WITH PROPOFOL  N/A 02/10/2020   Procedure: COLONOSCOPY WITH PROPOFOL ;  Surgeon: Janalyn Keene NOVAK, MD;  Location: [facility] ENDOSCOPY;  Service: Endoscopy;  Laterality: N/A;   NO PAST SURGERIES     Social History   Socioeconomic History   Marital status: Married    Spouse name: Not on file   Number of children: 1   Years of education: High School   Highest education level: High school graduate  Occupational History   Occupation: Self Employed - Horticulturist, commercial  Tobacco Use   Smoking status: Former    Current packs/day: 0.00    Average packs/day: 1 pack/day for 20.0 years (20.0 ttl pk-yrs)    Types: Cigarettes    Start date: 22    Quit date: 2016    Years since quitting: 9.5   Smokeless  tobacco: Former  Building services engineer status: Never Used  Substance and Sexual Activity   Alcohol use: Yes    Alcohol/week: 3.0 standard drinks of alcohol    Types: 3 Cans of beer per week   Drug use: Never   Sexual activity: Not on file  Other Topics Concern   Not on file  Social History Narrative   Not on file   Social Drivers of Health   Financial Resource Strain: Low Risk  (09/04/2023)   Received from [facility]   Overall Financial Resource Strain (CARDIA)    Difficulty of Paying Living Expenses: Not hard at all  Food Insecurity: No Food Insecurity (09/04/2023)   [facility] from [facility]   Hunger Vital Sign    Within the past 12 months, you worried that your food would run out before you got the money to buy more.: Never true    Within the past 12 months, the food you bought just didn't last and you didn't have money to get more.: Never true  Transportation Needs: Unknown (09/04/2023)   Received from [facility] - Transportation    In the past 12 months, has lack of transportation kept you from medical appointments or from getting medications?: No    Lack of Transportation (Non-Medical): Not on file  Physical Activity: Sufficiently Active (06/11/2023)   Exercise Vital Sign    Days of Exercise per Week: 5 days    Minutes of Exercise per Session: 60 min  Recent Concern: Physical Activity - Insufficiently Active (04/05/2023)   Exercise Vital Sign    Days of Exercise per Week: 3 days    Minutes of Exercise per Session: 30 min  Stress: No Stress Concern Present (04/05/2023)   [employer] of Occupational Health - Occupational Stress Questionnaire    Feeling of Stress : Not at all  Social Connections: Moderately Integrated (04/05/2023)   Social Connection and Isolation Panel    Frequency of Communication with Friends and Family: More than three times a week    Frequency of Social Gatherings with Friends and  Family: Three times a week    Attends Religious Services: Never    Active Member of Clubs or Organizations: Yes    Attends Banker Meetings: More than 4 times per year    Marital Status: Married  Catering manager Violence: Not At Risk (04/05/2023)   Humiliation, Afraid, Rape, and Kick questionnaire    Fear of Current or Ex-Partner: No    Emotionally Abused: No    Physically Abused: No  Sexually Abused: No   Family History  Problem Relation Age of Onset   Heart disease Mother    Pancreatic cancer Brother    Current Outpatient Medications on File Prior to Visit  Medication Sig   amLODipine  (NORVASC ) 10 MG tablet Take 1 tablet (10 mg total) by mouth daily.   Blood Glucose Monitoring Suppl DEVI 1 each by Does not apply route in the morning, at noon, and at bedtime. May substitute to any manufacturer covered by patient's insurance.   finasteride  (PROSCAR ) 5 MG tablet Take 1 tablet (5 mg total) by mouth daily.   lidocaine  (LIDODERM ) 5 % Place 1 patch onto the skin every 12 (twelve) hours. Remove & Discard patch within 12 hours or as directed by MD   losartan  (COZAAR ) 50 MG tablet Take 1 tablet (50 mg total) by mouth daily.   OZEMPIC , 1 MG/DOSE, 4 MG/3ML SOPN Inject 1 mg into the skin once a week.   pravastatin  (PRAVACHOL ) 40 MG tablet Take 1 tablet (40 mg total) by mouth daily.   BAYER ASPIRIN EC LOW DOSE 81 MG tablet 1 tablet (81 mg total). (Patient not taking: Reported on 01/29/2024)   No current facility-administered medications on file prior to visit.    Review of Systems  Constitutional:  Negative for activity change, appetite change, chills, diaphoresis, fatigue and fever.  HENT:  Negative for congestion and hearing loss.   Eyes:  Negative for visual disturbance.  Respiratory:  Negative for cough, chest tightness, shortness of breath and wheezing.   Cardiovascular:  Negative for chest pain, palpitations and leg swelling.  Gastrointestinal:  Negative for abdominal  pain, constipation, diarrhea, nausea and vomiting.  Genitourinary:  Negative for dysuria, frequency and hematuria.  Musculoskeletal:  Negative for arthralgias and neck pain.  Skin:  Negative for rash.  Neurological:  Negative for dizziness, weakness, light-headedness, numbness and headaches.  Hematological:  Negative for adenopathy.  Psychiatric/Behavioral:  Negative for behavioral problems, dysphoric mood and sleep disturbance.    Per HPI unless specifically indicated above     Objective:    BP 128/80 (BP Location: Right Arm, Patient Position: Sitting, Cuff Size: Normal)   Pulse 75   Ht 5' 9 (1.753 m)   Wt 190 lb 12.8 oz (86.5 kg)   SpO2 97%   BMI 28.18 kg/m   Wt Readings from Last 3 Encounters:  01/29/24 190 lb 12.8 oz (86.5 kg)  10/12/23 192 lb (87.1 kg)  09/18/23 197 lb (89.4 kg)    Physical Exam Vitals and nursing note reviewed.  Constitutional:      General: He is not in acute distress.    Appearance: He is well-developed. He is not diaphoretic.     Comments: Well-appearing, comfortable, cooperative  HENT:     Head: Normocephalic and atraumatic.  Eyes:     General:        Right eye: No discharge.        Left eye: No discharge.     Conjunctiva/sclera: Conjunctivae normal.     Pupils: Pupils are equal, round, and reactive to light.  Neck:     Thyroid: No thyromegaly.  Cardiovascular:     Rate and Rhythm: Normal rate and regular rhythm.     Pulses: Normal pulses.     Heart sounds: Normal heart sounds. No murmur heard. Pulmonary:     Effort: Pulmonary effort is normal. No respiratory distress.     Breath sounds: Normal breath sounds. No wheezing or rales.  Abdominal:  General: Bowel sounds are normal. There is no distension.     Palpations: Abdomen is soft. There is no mass.     Tenderness: There is no abdominal tenderness.  Musculoskeletal:        General: No tenderness. Normal range of motion.     Cervical back: Normal range of motion and neck supple.      Comments: Upper / Lower Extremities: - Normal muscle tone, strength bilateral upper extremities 5/5, lower extremities 5/5  Lymphadenopathy:     Cervical: No cervical adenopathy.  Skin:    General: Skin is warm and dry.     Findings: No erythema or rash.  Neurological:     Mental Status: He is alert and oriented to person, place, and time.     Comments: Distal sensation intact to light touch all extremities  Psychiatric:        Mood and Affect: Mood normal.        Behavior: Behavior normal.        Thought Content: Thought content normal.     Comments: Well groomed, good eye contact, normal speech and thoughts     Diabetic Foot Exam - Simple   Simple Foot Form Diabetic Foot exam was performed with the following findings: Yes 01/29/2024  2:03 PM  Visual Inspection See comments: Yes Sensation Testing Intact to touch and monofilament testing bilaterally: Yes Pulse Check Posterior Tibialis and Dorsalis pulse intact bilaterally: Yes Comments Left foot mild callus inner aspect great toe. No ulceration. Intact monofilament sensation.        Results for orders placed or performed in visit on 10/11/23  PSA   Collection Time: 10/11/23  1:40 PM  Result Value Ref Range   Prostate Specific Ag, Serum 2.0 0.0 - 4.0 ng/mL      Assessment & Plan:   Problem List Items Addressed This Visit     BPH with obstruction/lower urinary tract symptoms   Essential hypertension   Relevant Orders   CBC with Differential/Platelet   Comprehensive metabolic panel with GFR   CT CARDIAC SCORING (SELF PAY ONLY)   Hyperlipidemia associated with type 2 diabetes mellitus (HCC)   Relevant Orders   Lipid panel   TSH   CT CARDIAC SCORING (SELF PAY ONLY)   Type 2 diabetes mellitus with other specified complication (HCC)   Relevant Orders   Hemoglobin A1c   Microalbumin / creatinine urine ratio   CT CARDIAC SCORING (SELF PAY ONLY)   Other Visit Diagnoses       Annual physical exam    -  Primary    Relevant Orders   Lipid panel   Hemoglobin A1c   CBC with Differential/Platelet   TSH   Comprehensive metabolic panel with GFR     Long-term current use of injectable noninsulin antidiabetic medication         Elevated PSA, less than 10 ng/ml            Updated Health Maintenance information Reviewed recent lab results with patient Encouraged improvement to lifestyle with diet and exercise Goal of weight loss  Type 2 Diabetes Mellitus Managed with Ozempic  1 mg. No neuropathy or foot issues. Blood glucose monitoring scheduled. - Continue Ozempic  1 mg (Patient Assistance Program, PAP) picked up meds today - A1c today - Schedule follow-up blood glucose test in January.  Hypertension Well-controlled with current management. Today's reading was 128/80 mmHg.  Hyperlipidemia Well-controlled with current medication. Cholesterol levels are good. - Perform blood test today to check cholesterol  levels.  General Health Maintenance Vaccinations up to date. Regular screenings and preventive measures discussed. Recommended heart CT scan for coronary artery disease screening due to age and history. - Perform urine screening today. Urine microalbumin - Order heart CT scan for coronary artery disease screening. - Provide list of optometrists for diabetic eye exam. - Encourage continuation of physical activity.        Orders Placed This Encounter  Procedures   CT CARDIAC SCORING (SELF PAY ONLY)    Standing Status:   Future    Expiration Date:   01/28/2025    Preferred imaging location?:   [facility]   Lipid panel    Has the patient fasted?:   Yes   Hemoglobin A1c   CBC with Differential/Platelet   Microalbumin / creatinine urine ratio   TSH   Comprehensive metabolic panel with GFR    Has the patient fasted?:   Yes    No orders of the defined types were placed in this encounter.    Follow up plan: Return for 6 month DM A1c.  Marsa Officer, DO [facility] 01/29/2024, 1:52 PM

## 2024-01-29 NOTE — Patient Instructions (Addendum)
 Thank you for coming to the office today.  Based on the scan we may add back the baby aspirin 81mg  daily  Keep on Ozempic  1mg   Labs and Urine today Stay tuned for results  Diabetic Eye Exam (1 x per year)  [facility]   Address: [address] [place], [place] [address] Phone: [phone]  Website: [email or web address]   [facility] [address], [place], [place] [address] Phone: [phone] [email or web address]  [facility]  Address: [address] [place], [place], [place] [address] Phone: [phone]   [facility] [address] [place], [place] [address] Phone: [phone]  [facility] Address: [address] [place], [place], [place] [address]  Phone: [phone]  ---------------   You have been referred for a Coronary Calcium Score Cardiac CT Scan. This is a screening test for patients aged 11-50+ with cardiovascular risk factors or who are healthy but would be interested in Cardiovascular Screening for heart disease. Even if there is a family history of heart disease, this imaging can be useful. Typically it can be done every 5+ years or at a different timeline we agree on  The scan will look at the chest and mainly focus on the heart and identify early signs of calcium build up or blockages within the heart arteries. It is not 100% accurate for identifying blockages or heart disease, but it is useful to help us  predict who may have some early changes or be at risk in the future for a heart attack or cardiovascular problem.  The results are reviewed by a Cardiologist and they will document the results. It should become available on MyChart. Typically the results are divided into percentiles based on other patients of the same demographic and age. So it will compare your risk to others similar to you. If you have a higher score >99 or higher percentile >75%tile, it is recommended to consider Statin cholesterol therapy and or referral to  Cardiologist. I will try to help explain your results and if we have questions we can contact the Cardiologist.  You will be contacted for scheduling. Usually it is done at any imaging facility through [facility], [facility] or [facility].  The cost is $99 flat fee total and it does not go through insurance, so no authorization is required.   Please schedule a Follow-up Appointment to: Return for 6 month DM A1c.  If you have any other questions or concerns, please feel free to call the office or send a message through MyChart. You may also schedule an earlier appointment if necessary.  Additionally, you may be receiving a survey about your experience at our office within a few days to 1 week by e-mail or mail. We value your feedback.  Marsa Officer, DO [facility], [place]

## 2024-01-29 NOTE — Telephone Encounter (Signed)
 Tried calling patient no answer or VM     Ozempic  shipment in office he can pick up at earliest convenience

## 2024-01-30 LAB — MICROALBUMIN / CREATININE URINE RATIO
Creatinine, Urine: 200 mg/dL (ref 20–320)
Microalb, Ur: <0.2 mg/dL

## 2024-01-30 LAB — HEMOGLOBIN A1C
Hgb A1c MFr Bld: 6.2 % — ABNORMAL HIGH (ref <5.7)
Mean Plasma Glucose: 131 mg/dL
eAG (mmol/L): 7.3 mmol/L

## 2024-01-30 LAB — COMPREHENSIVE METABOLIC PANEL WITH GFR
AG Ratio: 1.9 (calc) (ref 1.0–2.5)
ALT: 19 U/L (ref 9–46)
AST: 21 U/L (ref 10–35)
Albumin: 4.3 g/dL (ref 3.6–5.1)
Alkaline phosphatase (APISO): 60 U/L (ref 35–144)
BUN: 15 mg/dL (ref 7–25)
CO2: 30 mmol/L (ref 20–32)
Calcium: 9.6 mg/dL (ref 8.6–10.3)
Chloride: 105 mmol/L (ref 98–110)
Creat: 0.9 mg/dL (ref 0.70–1.35)
Globulin: 2.3 g/dL (ref 1.9–3.7)
Glucose, Bld: 82 mg/dL (ref 65–99)
Potassium: 4.4 mmol/L (ref 3.5–5.3)
Sodium: 143 mmol/L (ref 135–146)
Total Bilirubin: 0.3 mg/dL (ref 0.2–1.2)
Total Protein: 6.6 g/dL (ref 6.1–8.1)
eGFR: 92 mL/min/1.73m2 (ref >=60)

## 2024-01-30 LAB — CBC WITH DIFFERENTIAL/PLATELET
Absolute Lymphocytes: 2104 {cells}/uL (ref 850–3900)
Absolute Monocytes: 525 {cells}/uL (ref 200–950)
Basophils Absolute: 21 {cells}/uL (ref 0–200)
Basophils Relative: 0.4 %
Eosinophils Absolute: 48 {cells}/uL (ref 15–500)
Eosinophils Relative: 0.9 %
HCT: 42.6 % (ref 38.5–50.0)
Hemoglobin: 13.7 g/dL (ref 13.2–17.1)
MCH: 30.2 pg (ref 27.0–33.0)
MCHC: 32.2 g/dL (ref 32.0–36.0)
MCV: 93.8 fL (ref 80.0–100.0)
MPV: 10.9 fL (ref 7.5–12.5)
Monocytes Relative: 9.9 %
Neutro Abs: 2602 {cells}/uL (ref 1500–7800)
Neutrophils Relative %: 49.1 %
Platelets: 211 Thousand/uL (ref 140–400)
RBC: 4.54 Million/uL (ref 4.20–5.80)
RDW: 13 % (ref 11.0–15.0)
Total Lymphocyte: 39.7 %
WBC: 5.3 Thousand/uL (ref 3.8–10.8)

## 2024-01-30 LAB — LIPID PANEL
Cholesterol: 91 mg/dL (ref <200)
HDL: 65 mg/dL (ref >=40)
LDL Cholesterol (Calc): 12 mg/dL
Non-HDL Cholesterol (Calc): 26 mg/dL (ref <130)
Total CHOL/HDL Ratio: 1.4 (calc) (ref <5.0)
Triglycerides: 64 mg/dL (ref <150)

## 2024-01-30 LAB — PSA: PSA: 2.37 ng/mL (ref <=4.00)

## 2024-01-30 LAB — TSH: TSH: 0.43 m[IU]/L (ref 0.40–4.50)

## 2024-02-01 ENCOUNTER — Ambulatory Visit: Payer: Self-pay | Admitting: Family Medicine

## 2024-02-06 ENCOUNTER — Other Ambulatory Visit

## 2024-02-06 ENCOUNTER — Encounter: Payer: Self-pay | Admitting: Pharmacist

## 2024-02-06 ENCOUNTER — Other Ambulatory Visit: Payer: Self-pay | Admitting: Pharmacist

## 2024-02-06 DIAGNOSIS — E1169 Type 2 diabetes mellitus with other specified complication: Secondary | ICD-10-CM

## 2024-02-06 DIAGNOSIS — Z7985 Long-term (current) use of injectable non-insulin antidiabetic drugs: Secondary | ICD-10-CM

## 2024-02-06 NOTE — Progress Notes (Signed)
   02/06/2024  Patient ID: Nicholas Black Agent, male   DOB: [birth year], 68 y.o.   MRN: [id number]  Patient enrolled in Ozempic  patient assistance program from [employer].  As of 7/25, Ozempic  will no longer be eligible for automatic refill from [employer]. To request refills after this date, program must receive a refill request form from office up to 30 days prior to refill being due.  Outreach to patient today and provide this update. Advise patient to monitor her supply of Ozempic  at home and when has only a 1 month supply remaining, to contact Nicholas Black: Phone: [phone] to request CPhT start refill form with PCP to be sent to program.  Nicholas Black, PharmD, Nicholas Black, CPP Clinical Pharmacist [facility] [phone]

## 2024-02-06 NOTE — Patient Instructions (Signed)
 As of 7/25, Ozempic  will no longer be automatically refilled from [employer] patient assistance program. To request refills after this date, program must receive a refill request form from office up to 30 days prior to refill being due.   Please monitor your supply of Ozempic  at home and when you have only a 1 month supply remaining, contact Suzen Mall at [phone] to request she start refill process for you.   Thank you!   Sharyle Sia, PharmD, [facility] Clinical Pharmacist [facility] [phone]

## 2024-02-15 ENCOUNTER — Encounter: Payer: Self-pay | Admitting: Pharmacist

## 2024-02-15 NOTE — Progress Notes (Signed)
   02/15/2024  Patient ID: Nicholas Black, male   DOB: [birth year], 68 y.o.   MRN: [id number]  This patient is appearing on a report for the adherence measure for cholesterol (statin) and hypertension (ACEi/ARB) medications this calendar year.   Medication: pravastatin  40 mg Last fill date: 01/03/2024 for 90 day supply  Medication: losartan  50 mg Last fill date: 01/03/2024 for 90 day supply  Insurance report was not up to date. No action needed at this time.   Sharyle Sia, PharmD, [name], CPP Clinical Pharmacist [facility] [phone]

## 2024-03-04 ENCOUNTER — Telehealth: Payer: Self-pay

## 2024-03-04 NOTE — Telephone Encounter (Signed)
 Received request from [employer] for re-order for Ozempic . completed form and faxed to provider for signature.

## 2024-03-10 ENCOUNTER — Other Ambulatory Visit (INDEPENDENT_AMBULATORY_CARE_PROVIDER_SITE_OTHER)

## 2024-03-10 DIAGNOSIS — I10 Essential (primary) hypertension: Secondary | ICD-10-CM

## 2024-03-10 DIAGNOSIS — E1169 Type 2 diabetes mellitus with other specified complication: Secondary | ICD-10-CM

## 2024-03-10 DIAGNOSIS — Z7985 Long-term (current) use of injectable non-insulin antidiabetic drugs: Secondary | ICD-10-CM

## 2024-03-10 NOTE — Progress Notes (Signed)
 03/10/2024 Name: Nicholas Black MRN: [id number] DOB: February 26, 1956  Chief Complaint  Patient presents with   Medication Management   Medication Assistance    Nicholas Black is a 68 y.o. year old male who presented for a telephone visit.   They were referred to the pharmacist by their PCP for assistance in managing diabetes, hypertension, hyperlipidemia, and medication access.      Subjective:   Care Team: Primary Care Provider: Edman Marsa PARAS, DO ; Next Scheduled Visit: 07/31/2024 Urology: Twylla Glendia BROCKS, MD  Medication Access/Adherence  Current Pharmacy:  [facility] - [place], [place] - [address] [place] [address] Phone: [phone] Fax: [phone]  [id number] - [place], [place] - [address] [place] [address] Phone: [phone] Fax: [phone]   Patient reports affordability concerns with their medications: No  Patient reports access/transportation concerns to their pharmacy: No  Patient reports adherence concerns with their medications:  No   Patient uses weekly pillbox    Diabetes:   Current medications:  Ozempic  1 mg weekly on Tuesdays    Medications tried in the past: metformin  ER   Denies checking home blood sugar recently   Statin: pravastatin  40 mg daily   Current physical activity: attending spin class for 45 minutes x 3-4 days/week   Current medication access support: Patient enrolled in patient assistance for Ozempic  from [employer] through 07/23/2024   Hypertension:   Current medications:  - amlodipine  10 mg daily - losartan  50 mg daily   Reports has access to upper arm blood pressure monitor at home, but denies monitoring home blood pressure recently   Patient denies hypotensive s/sx including dizziness, lightheadedness.    Current physical activity: attending spin class for 45 minutes x 3-4 days/week       Objective:  Lab Results   Component Value Date   HGBA1C 6.2 (H) 01/29/2024    Lab Results  Component Value Date   CREATININE 0.90 01/29/2024   BUN 15 01/29/2024   NA 143 01/29/2024   K 4.4 01/29/2024   CL 105 01/29/2024   CO2 30 01/29/2024    Lab Results  Component Value Date   CHOL 91 01/29/2024   HDL 65 01/29/2024   LDLCALC 12 01/29/2024   TRIG 64 01/29/2024   CHOLHDL 1.4 01/29/2024   BP Readings from Last 3 Encounters:  01/29/24 128/80  10/12/23 133/86  09/18/23 132/80   Pulse Readings from Last 3 Encounters:  01/29/24 75  10/12/23 89  09/18/23 78     Medications Reviewed Today     Reviewed by Alana Sharyle LABOR, RPH-CPP (Pharmacist) on 03/10/24 at 1009  Med List Status: <None>   Medication Order Taking? Sig Documenting Provider Last Dose Status Informant  amLODipine  (NORVASC ) 10 MG tablet [id number] Yes Take 1 tablet (10 mg total) by mouth daily. Edman Marsa PARAS, DO  Active   BAYER ASPIRIN EC LOW DOSE 81 MG tablet [id number]  1 tablet (81 mg total).  Patient not taking: Reported on 01/29/2024   Edman Marsa PARAS, DO  Active   Blood Glucose Monitoring Suppl DEVI [id number]  1 each by Does not apply route in the morning, at noon, and at bedtime. May substitute to any manufacturer covered by patient's insurance. Floy Roberts, MD  Active   finasteride  (PROSCAR ) 5 MG tablet [id number]  Take 1 tablet (5 mg total) by mouth daily. Karamalegos, Marsa PARAS, DO  Active   lidocaine  (LIDODERM )  5 % [id number]  Place 1 patch onto the skin every 12 (twelve) hours. Remove & Discard patch within 12 hours or as directed by MD Cyrena Mylar, MD  Active   losartan  (COZAAR ) 50 MG tablet [id number] Yes Take 1 tablet (50 mg total) by mouth daily. Edman Marsa PARAS, DO  Active   OZEMPIC , 1 MG/DOSE, 4 MG/3ML SOPN [id number] Yes Inject 1 mg into the skin once a week. Edman Marsa PARAS, DO  Active   pravastatin  (PRAVACHOL ) 40 MG tablet [id number]  Take 1 tablet (40 mg total) by mouth daily.  Edman Marsa PARAS, DO  Active               Assessment/Plan:   Encourage patient to continue using weekly pillbox as adherence aid  Patient to contact [facility] [phone]) to reschedule missed Coronary Calcium Score Cardiac CT Scan as ordered by PCP  Encourage patient to call to schedule annual eye exam   Diabetes: - Have reviewed long term cardiovascular and renal outcomes of uncontrolled blood sugar - Encourage patient to restart monitoring home glucose and have record of results to review during upcoming appointments      Hypertension: - Have reviewed long term cardiovascular and renal outcomes of uncontrolled blood pressure - Have reviewed appropriate blood pressure monitoring technique and reviewed goal blood pressure.  - Have recommend to limit consumption of salt/sodium - Recommended to check home blood pressure and heart rate and have record of results to review during upcoming appointments      Follow Up Plan: Clinical Pharmacist will follow up with patient by telephone on 05/16/2024 at 9:30 AM    Sharyle Sia, PharmD, [name], CPP Clinical Pharmacist [facility] [phone]

## 2024-03-10 NOTE — Patient Instructions (Signed)
 Goals Addressed             This Visit's Progress    Pharmacy Goals       If you need to reach out to the Ozempic  patient assistance program regarding refills or to find out the status of your application, you can do so by calling:   [employer] at [phone]    Our goal A1c is less than 7%. This corresponds with fasting sugars less than 130 and 2 hour after meal sugars less than 180. Please keep a log of your results when checking your blood sugar   Our goal bad cholesterol, or LDL, is less than 70 . This is why it is important to continue taking your pravastatin .  Check your blood pressure twice weekly, and any time you have concerning symptoms like headache, chest pain, dizziness, shortness of breath, or vision changes.   Our goal is less than 130/80.  To appropriately check your blood pressure, make sure you do the following:  1) Avoid caffeine, exercise, or tobacco products for 30 minutes before checking. Empty your bladder. 2) Sit with your back supported in a flat-backed chair. Rest your arm on something flat (arm of the chair, table, etc). 3) Sit still with your feet flat on the floor, resting, for at least 5 minutes.  4) Check your blood pressure. Take 1-2 readings.  5) Write down these readings and bring with you to any provider appointments.  Bring your home blood pressure machine with you to a provider's office for accuracy comparison at least once a year.   Make sure you take your blood pressure medications before you come to any office visit, even if you were asked to fast for labs.   Arthur Lash, PharmD, Becky Bowels, CPP Clinical Pharmacist [facility] [phone]

## 2024-03-19 ENCOUNTER — Telehealth: Payer: Self-pay

## 2024-03-19 NOTE — Telephone Encounter (Signed)
 Spoke with patient, received ozempic 

## 2024-04-10 ENCOUNTER — Ambulatory Visit: Payer: Medicare HMO

## 2024-04-10 VITALS — Ht 69.0 in | Wt 181.0 lb

## 2024-04-10 DIAGNOSIS — Z0000 Encounter for general adult medical examination without abnormal findings: Secondary | ICD-10-CM

## 2024-04-10 NOTE — Progress Notes (Signed)
 Subjective:   Nicholas Black is a 68 y.o. who presents for a Medicare Wellness preventive visit.  As a reminder, Annual Wellness Visits don't include a physical exam, and some assessments may be limited, especially if this visit is performed virtually. We may recommend an in-person follow-up visit with your provider if needed.  Visit Complete: Virtual I connected with  Nicholas Black on 04/10/24 by a audio enabled telemedicine application and verified that I am speaking with the correct person using two identifiers.  Patient Location: Home  Provider Location: Home Office  I discussed the limitations of evaluation and management by telemedicine. The patient expressed understanding and agreed to proceed.  Vital Signs: Because this visit was a virtual/telehealth visit, some criteria may be missing or patient reported. Any vitals not documented were not able to be obtained and vitals that have been documented are patient reported.    Persons Participating in Visit: Patient.  AWV Questionnaire: No: Patient Medicare AWV questionnaire was not completed prior to this visit.  Cardiac Risk Factors include: advanced age (>52men, >71 women);diabetes mellitus;male gender;hypertension     Objective:    Today's Vitals   04/10/24 1027  Weight: 181 lb (82.1 kg)  Height: 5' 9 (1.753 m)   Body mass index is 26.73 kg/m.     04/10/2024   10:35 AM 04/05/2023   12:08 PM 09/29/2022    6:42 PM 02/10/2020   10:02 AM 06/15/2019    2:59 PM  Advanced Directives  Does Patient Have a Medical Advance Directive? No No No Yes Yes  Type of Advance Directive     Living will  Would patient like information on creating a medical advance directive? No - Patient declined No - Patient declined No - Patient declined      Current Medications (verified) Outpatient Encounter Medications as of 04/10/2024  Medication Sig   amLODipine  (NORVASC ) 10 MG tablet Take 1 tablet (10 mg total) by mouth daily.   BAYER  ASPIRIN EC LOW DOSE 81 MG tablet 1 tablet (81 mg total). (Patient not taking: Reported on 01/29/2024)   Blood Glucose Monitoring Suppl DEVI 1 each by Does not apply route in the morning, at noon, and at bedtime. May substitute to any manufacturer covered by patient's insurance.   finasteride  (PROSCAR ) 5 MG tablet Take 1 tablet (5 mg total) by mouth daily.   lidocaine  (LIDODERM ) 5 % Place 1 patch onto the skin every 12 (twelve) hours. Remove & Discard patch within 12 hours or as directed by MD   losartan  (COZAAR ) 50 MG tablet Take 1 tablet (50 mg total) by mouth daily.   OZEMPIC , 1 MG/DOSE, 4 MG/3ML SOPN Inject 1 mg into the skin once a week.   pravastatin  (PRAVACHOL ) 40 MG tablet Take 1 tablet (40 mg total) by mouth daily.   No facility-administered encounter medications on file as of 04/10/2024.    Allergies (verified) Patient has no known allergies.   History: Past Medical History:  Diagnosis Date   Hypercholesteremia    Hypertension    Past Surgical History:  Procedure Laterality Date   COLONOSCOPY WITH PROPOFOL  N/A 02/10/2020   Procedure: COLONOSCOPY WITH PROPOFOL ;  Surgeon: Janalyn Keene NOVAK, MD;  Location: [facility] ENDOSCOPY;  Service: Endoscopy;  Laterality: N/A;   NO PAST SURGERIES     Family History  Problem Relation Age of Onset   Heart disease Mother    Pancreatic cancer Brother    Social History   Socioeconomic History   Marital status: Married  Spouse name: Not on file   Number of children: 1   Years of education: High School   Highest education level: Associate degree: occupational, Scientist, product/process development, or vocational program  Occupational History   Occupation: Self Employed - Horticulturist, commercial  Tobacco Use   Smoking status: Former    Current packs/day: 0.00    Average packs/day: 1 pack/day for 20.0 years (20.0 ttl pk-yrs)    Types: Cigarettes    Start date: 48    Quit date: 2016    Years since quitting: 9.7   Smokeless tobacco: Former  Building services engineer status:  Never Used  Substance and Sexual Activity   Alcohol use: Yes    Alcohol/week: 3.0 standard drinks of alcohol    Types: 3 Cans of beer per week   Drug use: Never   Sexual activity: Not on file  Other Topics Concern   Not on file  Social History Narrative   Not on file   Social Drivers of Health   Financial Resource Strain: Low Risk  (04/10/2024)   Overall Financial Resource Strain (CARDIA)    Difficulty of Paying Living Expenses: Not hard at all  Food Insecurity: No Food Insecurity (04/10/2024)   Hunger Vital Sign    Worried About Running Out of Food in the Last Year: Never true    Ran Out of Food in the Last Year: Never true  Transportation Needs: No Transportation Needs (04/10/2024)   PRAPARE - Administrator, Civil Service (Medical): No    Lack of Transportation (Non-Medical): No  Physical Activity: Sufficiently Active (04/10/2024)   Exercise Vital Sign    Days of Exercise per Week: 4 days    Minutes of Exercise per Session: 150+ min  Stress: No Stress Concern Present (04/10/2024)   [employer] of Occupational Health - Occupational Stress Questionnaire    Feeling of Stress: Not at all  Social Connections: Socially Integrated (04/10/2024)   Social Connection and Isolation Panel    Frequency of Communication with Friends and Family: More than three times a week    Frequency of Social Gatherings with Friends and Family: More than three times a week    Attends Religious Services: More than 4 times per year    Active Member of [employer] or Organizations: Yes    Attends Engineer, structural: More than 4 times per year    Marital Status: Married    Tobacco Counseling Counseling given: Not Answered    Clinical Intake:  Pre-visit preparation completed: Yes  Pain : No/denies pain     BMI - recorded: 26.73 Nutritional Status: BMI 25 -29 Overweight Nutritional Risks: None Diabetes: Yes CBG done?: No Did pt. bring in CBG monitor from home?: No  Lab  Results  Component Value Date   HGBA1C 6.2 (H) 01/29/2024   HGBA1C 6.4 (A) 09/18/2023   HGBA1C 6.2 (A) 05/22/2023     How often do you need to have someone help you when you read instructions, pamphlets, or other written materials from your doctor or pharmacy?: 1 - Never  Interpreter Needed?: No  Information entered by :: Rojelio Files LPN   Activities of Daily Living     04/10/2024   10:34 AM  In your present state of health, do you have any difficulty performing the following activities:  Hearing? 0  Vision? 0  Difficulty concentrating or making decisions? 0  Walking or climbing stairs? 0  Dressing or bathing? 0  Doing errands, shopping? 0  Preparing  Food and eating ? N  Using the Toilet? N  In the past six months, have you accidently leaked urine? N  Do you have problems with loss of bowel control? N  Managing your Medications? N  Managing your Finances? N  Housekeeping or managing your Housekeeping? N    Patient Care Team: Edman Marsa PARAS, DO as PCP - General (Family Medicine) Alana, Sharyle LABOR, RPH-CPP as Pharmacist  I have updated your Care Teams any recent Medical Services you may have received from other providers in the past year.     Assessment:   This is a routine wellness examination for Nicholas Black.  Hearing/Vision screen Hearing Screening - Comments:: Denies hearing difficulties   Vision Screening - Comments:: Wears rx glasses - Not up to date with routine eye exams. Patient pending appointment with referral.    Goals Addressed               This Visit's Progress     Continue physical activity (pt-stated)        Lose weight       Depression Screen     04/10/2024   10:31 AM 04/05/2023   12:07 PM 10/20/2022    1:51 PM 10/02/2022    2:58 PM 09/28/2022    9:52 AM 03/28/2022   10:29 AM 01/17/2021   10:58 AM  PHQ 2/9 Scores  PHQ - 2 Score 0 0 0 0 0 1 3  PHQ- 9 Score  0  5 0 4 16    Fall Risk     04/10/2024   10:34 AM 04/09/2023    9:08  AM 04/05/2023   12:09 PM 10/24/2022   12:21 PM 10/02/2022    2:58 PM  Fall Risk   Falls in the past year? 0 1 1 0 0  Number falls in past yr: 0 0 1 0 0  Injury with Fall? 0 1 1 0 0  Risk for fall due to : No Fall Risks History of fall(s) History of fall(s) No Fall Risks No Fall Risks  Follow up Falls evaluation completed Falls evaluation completed;Education provided;Falls prevention discussed Falls prevention discussed;Falls evaluation completed Falls evaluation completed Falls evaluation completed    MEDICARE RISK AT HOME:  Medicare Risk at Home Any stairs in or around the home?: Yes If so, are there any without handrails?: No Home free of loose throw rugs in walkways, pet beds, electrical cords, etc?: Yes Adequate lighting in your home to reduce risk of falls?: Yes Life alert?: No Use of a cane, walker or w/c?: No Grab bars in the bathroom?: Yes Shower chair or bench in shower?: No Elevated toilet seat or a handicapped toilet?: No  TIMED UP AND GO:  Was the test performed?  No  Cognitive Function: 6CIT completed        04/10/2024   10:35 AM 04/05/2023   12:10 PM  6CIT Screen  What Year? 0 points 0 points  What month? 0 points 0 points  What time? 0 points 0 points  Count back from 20 0 points 0 points  Months in reverse 0 points 0 points  Repeat phrase 0 points 0 points  Total Score 0 points 0 points    Immunizations Immunization History  Administered Date(s) Administered   Fluad Quad(high Dose 65+) 07/01/2021   Fluad Trivalent(High Dose 65+) 05/22/2023   Influenza,inj,Quad PF,6+ Mos 04/29/2018   Influenza-Unspecified 06/17/2019, 06/30/2020   PFIZER(Purple Top)SARS-COV-2 Vaccination 09/16/2019, 10/07/2019, 06/13/2020, 01/03/2021   PNEUMOCOCCAL CONJUGATE-20 03/28/2022  Pneumococcal Conjugate-13 01/15/2020   Tdap 06/15/2019   Zoster Recombinant(Shingrix ) 11/22/2023, 01/22/2024    Screening Tests Health Maintenance  Topic Date Due   Lung Cancer Screening  Never  done   OPHTHALMOLOGY EXAM  01/03/2024   Influenza Vaccine  02/22/2024   HEMOGLOBIN A1C  07/31/2024   Diabetic kidney evaluation - eGFR measurement  01/28/2025   Diabetic kidney evaluation - Urine ACR  01/28/2025   FOOT EXAM  01/28/2025   Colonoscopy  02/09/2025   Medicare Annual Wellness (AWV)  04/10/2025   DTaP/Tdap/Td (2 - Td or Tdap) 06/14/2029   Pneumococcal Vaccine: 50+ Years  Completed   Hepatitis C Screening  Completed   Zoster Vaccines- Shingrix   Completed   HPV VACCINES  Aged Out   Meningococcal B Vaccine  Aged Out   COVID-19 Vaccine  Discontinued    Health Maintenance Items Addressed:   Additional Screening:  Vision Screening: Recommended annual ophthalmology exams for early detection of glaucoma and other disorders of the eye. Is the patient up to date with their annual eye exam?  No  Who is the provider or what is the name of the office in which the patient attends annual eye exams? Patient pending appointment with referral  Dental Screening: Recommended annual dental exams for proper oral hygiene  Community Resource Referral / Chronic Care Management: CRR required this visit?  No   CCM required this visit?  No   Plan:    I have personally reviewed and noted the following in the patient's chart:   Medical and social history Use of alcohol, tobacco or illicit drugs  Current medications and supplements including opioid prescriptions. Patient is not currently taking opioid prescriptions. Functional ability and status Nutritional status Physical activity Advanced directives List of other physicians Hospitalizations, surgeries, and ER visits in previous 12 months Vitals Screenings to include cognitive, depression, and falls Referrals and appointments  In addition, I have reviewed and discussed with patient certain preventive protocols, quality metrics, and best practice recommendations. A written personalized care plan for preventive services as well as  general preventive health recommendations were provided to patient.   Rojelio LELON Blush, LPN   0/81/[id number]   After Visit Summary: (MyChart) Due to this being a telephonic visit, the after visit summary with patients personalized plan was offered to patient via MyChart   Notes: Nothing significant to report at this time.

## 2024-04-10 NOTE — Patient Instructions (Addendum)
 [name],  Thank you for taking the time for your Medicare Wellness Visit. I appreciate your continued commitment to your health goals. Please review the care plan we discussed, and feel free to reach out if I can assist you further.  Medicare recommends these wellness visits once per year to help you and your care team stay ahead of potential health issues. These visits are designed to focus on prevention, allowing your provider to concentrate on managing your acute and chronic conditions during your regular appointments.  Please note that Annual Wellness Visits do not include a physical exam. Some assessments may be limited, especially if the visit was conducted virtually. If needed, we may recommend a separate in-person follow-up with your provider.  Ongoing Care Seeing your primary care provider every 3 to 6 months helps us  monitor your health and provide consistent, personalized care.   Referrals If a referral was made during today's visit and you haven't received any updates within two weeks, please contact the referred provider directly to check on the status.  Recommended Screenings:  Health Maintenance  Topic Date Due   Screening for Lung Cancer  Never done   Eye exam for diabetics  01/03/2024   Flu Shot  02/22/2024   Hemoglobin A1C  07/31/2024   Yearly kidney function blood test for diabetes  01/28/2025   Yearly kidney health urinalysis for diabetes  01/28/2025   Complete foot exam   01/28/2025   Colon Cancer Screening  02/09/2025   Medicare Annual Wellness Visit  04/10/2025   DTaP/Tdap/Td vaccine (2 - Td or Tdap) 06/14/2029   Pneumococcal Vaccine for age over 57  Completed   Hepatitis C Screening  Completed   Zoster (Shingles) Vaccine  Completed   HPV Vaccine  Aged Out   Meningitis B Vaccine  Aged Out   COVID-19 Vaccine  Discontinued       04/10/2024   10:35 AM  Advanced Directives  Does Patient Have a Medical Advance Directive? No  Would patient like information  on creating a medical advance directive? No - Patient declined   Advance Care Planning is important because it: Ensures you receive medical care that aligns with your values, goals, and preferences. Provides guidance to your family and loved ones, reducing the emotional burden of decision-making during critical moments.  Vision: Annual vision screenings are recommended for early detection of glaucoma, cataracts, and diabetic retinopathy. These exams can also reveal signs of chronic conditions such as diabetes and high blood pressure.  Dental: Annual dental screenings help detect early signs of oral cancer, gum disease, and other conditions linked to overall health, including heart disease and diabetes.  Please see the attached documents for additional preventive care recommendations.

## 2024-04-15 ENCOUNTER — Telehealth: Payer: Self-pay

## 2024-04-15 NOTE — Telephone Encounter (Signed)
 Completed refill form for Ozempic  (Novo Nordisk) and faxed to provider's office for review and signature.

## 2024-04-22 ENCOUNTER — Encounter: Payer: Self-pay | Admitting: Pharmacist

## 2024-04-22 ENCOUNTER — Other Ambulatory Visit: Payer: Self-pay | Admitting: Pharmacist

## 2024-04-22 DIAGNOSIS — Z7985 Long-term (current) use of injectable non-insulin antidiabetic drugs: Secondary | ICD-10-CM

## 2024-04-22 DIAGNOSIS — E1169 Type 2 diabetes mellitus with other specified complication: Secondary | ICD-10-CM

## 2024-04-22 NOTE — Patient Instructions (Signed)
 Novo Nordisk, the maker of Ozempic  and Rybelsus, has announced that they will no longer offer these medications to Medicare beneficiaries through their Patient Assistance Program in 2026.    This means that if you currently receive Ozempic  or Rybelsus at no cost through the program, starting in January 2026 you'll need to use your insurance to continue on these medicines.      Choosing a Medicare Plan   With Medicare's Annual Enrollment Period starting on October 15th, we encourage you to review formulary coverage and copay amounts for Ozempic  or Rybelsus, as costs can vary widely between plans. Starting on Wednesday, October 1st you can compare your Medicare plan options by going to the Plan Finder tool at [employer].gov ( TeleconferenceOnDemand.fr ).    There are Medicare Specialists through the [facility] Insurance Information Program ([facility]) that can help you shop for Medicare plans.    [place] [facility] toll free number: [phone] (Monday - Friday 8 am - 5 pm)   [place] [facility] [address]     [place]  [address] [phone] Call for an appointment with [facility]   Maximum Out-of-Pocket and Prescription Payment Plan   In 2026, the maximum yearly out-of-pocket cost for medications is $2,100 for all Medicare beneficiaries. This means you will not have to pay more than $2,100 in total for all prescription medications filled on your Medicare plan in 2026. You may also choose to enroll in a Medicare Prescription Payment Plan through your chosen 2026 Medicare plan. This lets you spread out your prescription drug costs over the year instead of laying a large amount all at once at the pharmacy.    Sharyle Sia, PharmD, [name], CPP Clinical Pharmacist [facility] [phone]

## 2024-04-22 NOTE — Progress Notes (Signed)
   04/22/2024  Patient ID: Nicholas Black Agent, male   DOB: 1956/03/28, 68 y.o.   MRN: [id number]  Patient enrolled in Ozempic  patient assistance program from Novo [employer] through 07/23/2024  Novo [employer] has announced that they will no longer offer Ozempic  to [employer] beneficiaries through their Patient Assistance Program in 2026.   Outreach to patient today and provide this update.   Based on reported income, patient does not meet criteria for Extra Help subsidy  Counsel patient that Medicare's Annual Enrollment Period for 2026 starts 05/07/2024. Encourage patient to review deductibles formulary coverage and copay amounts (including for Ozempic ) between plans. Also share with patient the contact information for the [facility] Information Program [facility]) that can help with reviewing these Medicare plans As requested will share this information with patient via MyChart message  Sharyle Sia, PharmD, [name], CPP Clinical Pharmacist [facility] [phone]

## 2024-05-12 ENCOUNTER — Other Ambulatory Visit: Payer: Self-pay | Admitting: Pharmacist

## 2024-05-12 DIAGNOSIS — E1169 Type 2 diabetes mellitus with other specified complication: Secondary | ICD-10-CM

## 2024-05-12 DIAGNOSIS — Z7985 Long-term (current) use of injectable non-insulin antidiabetic drugs: Secondary | ICD-10-CM

## 2024-05-12 NOTE — Progress Notes (Unsigned)
   05/12/2024  Patient ID: Nicholas Black, male   DOB: [birth year], 68 y.o.   MRN: [id number]  Receive a voicemail from patient requesting a call back. Return call to patient.  Advises that he recently picked up a supply of Ozempic  from the office as delivered by Novo Nordisk patient assistance program and found when he got home that it was the Ozempic  0.5 mg strength, rather than Ozempic  1 mg that he is currently taking. Note patient has been on Ozempic  1 mg weekly since April.  Patient reports that he currently has a 3 week supply of Ozempic  1 mg remaining at home.  Collaborate with CPhT [place] and CMA [place] to initiate new patient assistance program refill request form for Ozempic  1 mg strength for patient.  Nicholas Black, PharmD, [name], CPP Clinical Pharmacist [facility] [phone]

## 2024-05-16 ENCOUNTER — Other Ambulatory Visit

## 2024-05-30 ENCOUNTER — Encounter: Payer: Self-pay | Admitting: Pharmacist

## 2024-05-30 ENCOUNTER — Other Ambulatory Visit: Admitting: Pharmacist

## 2024-05-30 DIAGNOSIS — E1169 Type 2 diabetes mellitus with other specified complication: Secondary | ICD-10-CM

## 2024-05-30 DIAGNOSIS — I10 Essential (primary) hypertension: Secondary | ICD-10-CM

## 2024-05-30 DIAGNOSIS — Z7985 Long-term (current) use of injectable non-insulin antidiabetic drugs: Secondary | ICD-10-CM

## 2024-05-30 NOTE — Progress Notes (Signed)
 05/30/2024 Name: Nicholas Black MRN: [id number] DOB: [birth year]  Chief Complaint  Patient presents with   Medication Management    Nicholas Black is a 68 y.o. year old male who presented for a telephone visit.   They were referred to the pharmacist by their PCP for assistance in managing diabetes, hypertension, hyperlipidemia, and medication access.      Subjective:   Care Team: Primary Care Provider: Edman Marsa PARAS, DO ; Next Scheduled Visit: 07/31/2024 Urology: Twylla Glendia BROCKS, MD  Medication Access/Adherence  Current Pharmacy:  [facility] - [place], [place] - [address] [place] [address] Phone: [phone] Fax: [phone]  [id number] - [place], [place] - [name] [place] [address] Phone: [phone] Fax: [phone]   Patient reports affordability concerns with their medications: No  Patient reports access/transportation concerns to their pharmacy: No  Patient reports adherence concerns with their medications:  No   Patient uses weekly pillbox    Diabetes:   Current medications:  Ozempic  1 mg weekly on Tuesdays    Medications tried in the past: metformin  ER   Denies checking home blood sugar recently   Statin: pravastatin  40 mg daily   Current physical activity: attending spin class for 45 minutes x 3-4 days/week   Current medication access support: Patient enrolled in patient assistance for Ozempic  from [employer] through 07/23/2024 Nov[employer] has announced that they will no longer offer Ozempic  to Medicare beneficiaries through their Patient Assistance Program in 2026.    Hypertension:   Current medications:  - amlodipine  10 mg daily - losartan  50 mg daily   Reports has access to upper arm blood pressure monitor at home, but denies monitoring home blood pressure recently   Patient denies hypotensive s/sx including dizziness, lightheadedness.     Current physical activity: attending spin class for 45 minutes x 3-4 days/week   Objective:  Lab Results  Component Value Date   HGBA1C 6.2 (H) 01/29/2024    Lab Results  Component Value Date   CREATININE 0.90 01/29/2024   BUN 15 01/29/2024   NA 143 01/29/2024   K 4.4 01/29/2024   CL 105 01/29/2024   CO2 30 01/29/2024    Lab Results  Component Value Date   CHOL 91 01/29/2024   HDL 65 01/29/2024   LDLCALC 12 01/29/2024   TRIG 64 01/29/2024   CHOLHDL 1.4 01/29/2024   BP Readings from Last 3 Encounters:  01/29/24 128/80  10/12/23 133/86  09/18/23 132/80   Pulse Readings from Last 3 Encounters:  01/29/24 75  10/12/23 89  09/18/23 78     Current Outpatient Medications on File Prior to Visit  Medication Sig Dispense Refill   amLODipine  (NORVASC ) 10 MG tablet Take 1 tablet (10 mg total) by mouth daily. 90 tablet 3   BAYER ASPIRIN EC LOW DOSE 81 MG tablet 1 tablet (81 mg total). (Patient not taking: Reported on 01/29/2024)     Blood Glucose Monitoring Suppl DEVI 1 each by Does not apply route in the morning, at noon, and at bedtime. May substitute to any manufacturer covered by patient's insurance. 1 each 0   finasteride  (PROSCAR ) 5 MG tablet Take 1 tablet (5 mg total) by mouth daily. 90 tablet 3   lidocaine  (LIDODERM ) 5 % Place 1 patch onto the skin every 12 (twelve) hours. Remove & Discard patch within 12 hours or as directed by MD 10 patch 0   losartan  (  COZAAR ) 50 MG tablet Take 1 tablet (50 mg total) by mouth daily. 90 tablet 3   OZEMPIC , 1 MG/DOSE, 4 MG/3ML SOPN Inject 1 mg into the skin once a week.     pravastatin  (PRAVACHOL ) 40 MG tablet Take 1 tablet (40 mg total) by mouth daily. 90 tablet 3   No current facility-administered medications on file prior to visit.      Assessment/Plan:   Encourage patient to continue using weekly pillbox as adherence aid   Remind patient to contact [facility] [phone]) to reschedule missed Coronary  Calcium Score Cardiac CT Scan as ordered by PCP   Again encourage patient to call to schedule annual eye exam  Patient plans to call me back next week once has decided on his Medicare plan for 2026, as requests to review pharmacy benefits information, particularly related to Ozempic , once he knows the plan name   Diabetes: - Have reviewed long term cardiovascular and renal outcomes of uncontrolled blood sugar - Encourage patient to restart monitoring home glucose and have record of results to review during upcoming appointments      Hypertension: - Have reviewed long term cardiovascular and renal outcomes of uncontrolled blood pressure - Have reviewed appropriate blood pressure monitoring technique and reviewed goal blood pressure.  - Have recommend to limit consumption of salt/sodium - Recommended to check home blood pressure and heart rate and have record of results to review during upcoming appointments      Follow Up Plan: Clinical Pharmacist will follow up with patient by telephone next month    Sharyle Sia, PharmD, [name], CPP Clinical Pharmacist [facility] [phone]

## 2024-06-03 NOTE — Patient Instructions (Signed)
 Goals Addressed             This Visit's Progress    Pharmacy Goals       If you need to reach out to the Ozempic  patient assistance program regarding refills, you can do so by calling:   [employer] at [phone]    Our goal A1c is less than 7%. This corresponds with fasting sugars less than 130 and 2 hour after meal sugars less than 180. Please keep a log of your results when checking your blood sugar   Our goal bad cholesterol, or LDL, is less than 70 . This is why it is important to continue taking your pravastatin .  Check your blood pressure twice weekly, and any time you have concerning symptoms like headache, chest pain, dizziness, shortness of breath, or vision changes.   Our goal is less than 130/80.  To appropriately check your blood pressure, make sure you do the following:  1) Avoid caffeine, exercise, or tobacco products for 30 minutes before checking. Empty your bladder. 2) Sit with your back supported in a flat-backed chair. Rest your arm on something flat (arm of the chair, table, etc). 3) Sit still with your feet flat on the floor, resting, for at least 5 minutes.  4) Check your blood pressure. Take 1-2 readings.  5) Write down these readings and bring with you to any provider appointments.  Bring your home blood pressure machine with you to a provider's office for accuracy comparison at least once a year.   Make sure you take your blood pressure medications before you come to any office visit, even if you were asked to fast for labs.  Sharyle Sia, PharmD, [name], CPP Clinical Pharmacist [facility] [phone]

## 2024-06-09 DIAGNOSIS — M7541 Impingement syndrome of right shoulder: Secondary | ICD-10-CM | POA: Diagnosis not present

## 2024-06-18 DIAGNOSIS — M25511 Pain in right shoulder: Secondary | ICD-10-CM | POA: Diagnosis not present

## 2024-06-20 ENCOUNTER — Other Ambulatory Visit: Payer: Self-pay | Admitting: Family Medicine

## 2024-06-20 DIAGNOSIS — I10 Essential (primary) hypertension: Secondary | ICD-10-CM

## 2024-06-24 NOTE — Telephone Encounter (Signed)
 Requested Prescriptions  Pending Prescriptions Disp Refills   amLODipine  (NORVASC ) 10 MG tablet [Pharmacy Med Name: AMLODIPINE  BESYLATE 10 MG TAB] 90 tablet 3    Sig: TAKE 1 TABLET BY MOUTH DAILY     Cardiovascular: Calcium Channel Blockers 2 Passed - 06/24/2024  5:47 PM      Passed - Last BP in normal range    BP Readings from Last 1 Encounters:  01/29/24 128/80         Passed - Last Heart Rate in normal range    Pulse Readings from Last 1 Encounters:  01/29/24 75         Passed - Valid encounter within last 6 months    Recent Outpatient Visits           4 months ago Annual physical exam   [facility] [place], Marsa PARAS, DO   9 months ago Type 2 diabetes mellitus with other specified complication, without long-term current use of insulin  [facility])   [facility] [place], Marsa PARAS, DO       Future Appointments             In 3 months Stoioff, Glendia BROCKS, MD [facility]

## 2024-06-26 ENCOUNTER — Telehealth: Payer: Self-pay | Admitting: Family Medicine

## 2024-06-26 ENCOUNTER — Other Ambulatory Visit: Payer: Self-pay

## 2024-06-26 DIAGNOSIS — M545 Low back pain, unspecified: Secondary | ICD-10-CM

## 2024-06-26 DIAGNOSIS — E1169 Type 2 diabetes mellitus with other specified complication: Secondary | ICD-10-CM

## 2024-06-26 DIAGNOSIS — M75121 Complete rotator cuff tear or rupture of right shoulder, not specified as traumatic: Secondary | ICD-10-CM | POA: Diagnosis not present

## 2024-06-26 DIAGNOSIS — E7800 Pure hypercholesterolemia, unspecified: Secondary | ICD-10-CM

## 2024-06-26 DIAGNOSIS — I10 Essential (primary) hypertension: Secondary | ICD-10-CM

## 2024-06-26 MED ORDER — PRAVASTATIN SODIUM 40 MG PO TABS: 40.0000 mg | ORAL_TABLET | Freq: Every day | ORAL | 3 refills | Status: AC

## 2024-06-26 MED ORDER — FINASTERIDE 5 MG PO TABS: 5.0000 mg | ORAL_TABLET | Freq: Every day | ORAL | 3 refills | Status: AC

## 2024-06-26 MED ORDER — LOSARTAN POTASSIUM 50 MG PO TABS: 50.0000 mg | ORAL_TABLET | Freq: Every day | ORAL | 3 refills | Status: AC

## 2024-06-26 MED ORDER — AMLODIPINE BESYLATE 10 MG PO TABS: 10.0000 mg | ORAL_TABLET | Freq: Every day | ORAL | 0 refills | Status: DC

## 2024-06-26 MED ORDER — OZEMPIC (1 MG/DOSE) 4 MG/3ML ~~LOC~~ SOPN: 1.0000 mg | PEN_INJECTOR | SUBCUTANEOUS | 0 refills | Status: DC

## 2024-06-26 NOTE — Telephone Encounter (Signed)
 Prescription Request  06/26/2024  LOV: 01/29/2024  What is the name of the medication or equipment? amLODipine  (NORVASC ) 10 MG tablet ,losartan  (COZAAR ) 50 MG tablet,OZEMPIC , 1 MG/DOSE, 4 MG/3ML SOPN pravastatin  (PRAVACHOL ) 40 MG tablet  BAYER ASPIRIN EC LOW DOSE 81 MG tablet ,Blood Glucose Monitoring Suppl DEVI ,finasteride  (PROSCAR ) 5 MG tablet , Have you contacted your pharmacy to request a refill? No  Which pharmacy would you like this sent to?  [facility] - [place], [place] - [address] [place] [address] Phone: [phone] Fax: [phone]  [id number] - [place], [place] - [name] [place] [address] Phone: [phone] Fax: [phone]    Patient notified that their request is being sent to the clinical staff for review and that they should receive a response within 2 business days.   Please advise at Mobile [phone] (mobile)

## 2024-06-29 ENCOUNTER — Other Ambulatory Visit: Payer: Self-pay

## 2024-06-29 ENCOUNTER — Emergency Department: Payer: Self-pay

## 2024-06-29 ENCOUNTER — Emergency Department
Admission: EM | Admit: 2024-06-29 | Discharge: 2024-06-29 | Disposition: A | Discharge status: Home / Self Care | Payer: Self-pay | Attending: Emergency Medicine | Admitting: Emergency Medicine

## 2024-06-29 DIAGNOSIS — M7918 Myalgia, other site: Secondary | ICD-10-CM

## 2024-06-29 DIAGNOSIS — M25512 Pain in left shoulder: Secondary | ICD-10-CM | POA: Diagnosis not present

## 2024-06-29 DIAGNOSIS — Y92410 Unspecified street and highway as the place of occurrence of the external cause: Secondary | ICD-10-CM | POA: Diagnosis not present

## 2024-06-29 DIAGNOSIS — M791 Myalgia, unspecified site: Secondary | ICD-10-CM | POA: Diagnosis not present

## 2024-06-29 DIAGNOSIS — M542 Cervicalgia: Secondary | ICD-10-CM | POA: Insufficient documentation

## 2024-06-29 DIAGNOSIS — M25511 Pain in right shoulder: Secondary | ICD-10-CM | POA: Diagnosis not present

## 2024-06-29 DIAGNOSIS — R519 Headache, unspecified: Secondary | ICD-10-CM | POA: Diagnosis present

## 2024-06-29 MED ORDER — NAPROXEN 500 MG PO TABS: 500.0000 mg | ORAL_TABLET | Freq: Two times a day (BID) | ORAL | 0 refills | Status: AC

## 2024-06-29 MED ORDER — LIDOCAINE 5 % EX PTCH: 1.0000 | MEDICATED_PATCH | Freq: Two times a day (BID) | CUTANEOUS | 0 refills | Status: AC

## 2024-06-29 MED ORDER — LIDOCAINE 5 % EX PTCH
1.0000 | MEDICATED_PATCH | CUTANEOUS | Status: DC
  Administered 2024-06-29: 1 via TRANSDERMAL
  Filled 2024-06-29: qty 1

## 2024-06-29 MED ORDER — ACETAMINOPHEN 325 MG PO TABS
650.0000 mg | ORAL_TABLET | Freq: Once | ORAL | Status: AC
  Administered 2024-06-29: 650 mg via ORAL
  Filled 2024-06-29: qty 2

## 2024-06-29 NOTE — Discharge Instructions (Addendum)
 Please follow-up with your outpatient provider.  Please return for any new, worsening, or changing symptoms or other concerns.  You may take the naproxen  and Lidoderm  patches as prescribed.  You may also take Tylenol  650 mg every 6-8 hours to help with your symptoms as needed.  Please return for any new, worsening, or changing symptoms or other concerns as we discussed.  It was a pleasure caring for you today
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
## 2024-07-08 ENCOUNTER — Telehealth: Payer: Self-pay | Admitting: Family Medicine

## 2024-07-08 NOTE — Telephone Encounter (Signed)
 Nicholas Black, it looks like this patient may still be eligible for refills through [employer] through end of 2025 on ozempic ?  Could you follow-up on this? I know in 2026 it is no longer available. Otherwise I can send to his retail pharmacy as requested by this phone call.  You have an upcoming phone call with him on 12/19 this week and I see him on 07/31/24 for Diabetes follow-up  Marsa Officer, DO [facility] 07/08/2024, 12:42 PM

## 2024-07-08 NOTE — Telephone Encounter (Signed)
 Prescription Request  07/08/2024  LOV: 06/26/2024  What is the name of the medication or equipment?   OZEMPIC , 1 MG/DOSE, 4 MG/3ML SOPN    Have you contacted your pharmacy to request a refill? No   Which pharmacy would you like this sent to?  [facility] [address] ([address]), [place] - [address] [place]) [place] [address] Phone: [phone] Fax: [phone]    Patient notified that their request is being sent to the clinical staff for review and that they should receive a response within 2 business days.   Please advise at Mobile [phone] (mobile)

## 2024-07-09 ENCOUNTER — Other Ambulatory Visit: Payer: Self-pay | Admitting: Pharmacist

## 2024-07-09 ENCOUNTER — Other Ambulatory Visit (HOSPITAL_COMMUNITY): Payer: Self-pay

## 2024-07-09 DIAGNOSIS — Z7985 Long-term (current) use of injectable non-insulin antidiabetic drugs: Secondary | ICD-10-CM

## 2024-07-09 DIAGNOSIS — E1169 Type 2 diabetes mellitus with other specified complication: Secondary | ICD-10-CM

## 2024-07-09 NOTE — Telephone Encounter (Signed)
 Understood. Thank you!  Yes we have samples of Ozempic , he can pick up 1 box Ozempic  for 0.5mg  weekly dosing.  Sharyle - will you contact patient for sample pick up - or would you need our assistance to contact him and arrange pick up of Ozempic  sample?  Routed chart to both you and [facility] Clinical  Marsa Officer, DO [facility] 07/09/2024, 12:08 PM

## 2024-07-09 NOTE — Progress Notes (Addendum)
° °  07/09/2024  Patient ID: Nicholas Black, male   DOB: 03/31/1956, 68 y.o.   MRN: [id number]  Receive message from PCP requesting outreach to patient as patient contacted office regarding his Ozempic /needing further refill  Reach patient today by telephone. Reports that he only has sufficient supply of Ozempic  remaining for 1 further dose.   Note office faxed refill request for Ozempic  1 mg to [employer] on behalf of patient on 05/12/2024  Outreach to [employer] on behalf of patient today. Per representative, patient has received max number of boxes through program for current calendar year (also note program ending for Medicare patients as of 07/23/2024)  Collaborate with CPhT to run test claim for 71-month supply of Ozempic  through patient's insurance. Find that cost is $245.33 through his plan. Note for his current [employer], patient has 25% coinsurance for tier 3 medications.  Patient shares that he is enrolled in an [employer] Medicare plan for 2026 calendar year, but does not yet have this card or plan name, but states will contact insurance and have this information for us  to review during our upcoming appointment on 07/11/2024  Note if Ozempic  not affordable through patient's Medicare prescription plan in 2026, patient assistance program for therapeutic alternative, Trulicity, is now open through [employer]. Would check to see if patient meets criteria.  In meantime, will collaborate with office to see if sample of Ozempic  is available for patient to use for remainder of current calendar year.  Sharyle Sia, PharmD, [name], CPP Clinical Pharmacist [facility] [phone]

## 2024-07-09 NOTE — Telephone Encounter (Signed)
 Tried calling patient no answer or VM    Please let patient know to come by the office and pick up a sample of Ozempic 

## 2024-07-09 NOTE — Telephone Encounter (Signed)
 Patient notified to pick up sample of ozempic 

## 2024-07-09 NOTE — Patient Instructions (Addendum)
 Please have the information about your Medicare prescription card for 2026 with you for our telephone appointment on 07/11/2024 at 10:00 AM   Thank you!  Sharyle Sia, PharmD, [name], CPP Clinical Pharmacist [facility] [phone]

## 2024-07-11 ENCOUNTER — Other Ambulatory Visit

## 2024-07-11 DIAGNOSIS — Z7985 Long-term (current) use of injectable non-insulin antidiabetic drugs: Secondary | ICD-10-CM

## 2024-07-11 DIAGNOSIS — E1169 Type 2 diabetes mellitus with other specified complication: Secondary | ICD-10-CM

## 2024-07-11 NOTE — Patient Instructions (Signed)
Goals Addressed             This Visit's Progress    Pharmacy Goals       Our goal A1c is less than 7%. This corresponds with fasting sugars less than 130 and 2 hour after meal sugars less than 180. Please keep a log of your results when checking your blood sugar   Our goal bad cholesterol, or LDL, is less than 70 . This is why it is important to continue taking your pravastatin.  Check your blood pressure twice weekly, and any time you have concerning symptoms like headache, chest pain, dizziness, shortness of breath, or vision changes.   Our goal is less than 130/80.  To appropriately check your blood pressure, make sure you do the following:  1) Avoid caffeine, exercise, or tobacco products for 30 minutes before checking. Empty your bladder. 2) Sit with your back supported in a flat-backed chair. Rest your arm on something flat (arm of the chair, table, etc). 3) Sit still with your feet flat on the floor, resting, for at least 5 minutes.  4) Check your blood pressure. Take 1-2 readings.  5) Write down these readings and bring with you to any provider appointments.  Bring your home blood pressure machine with you to a provider's office for accuracy comparison at least once a year.   Make sure you take your blood pressure medications before you come to any office visit, even if you were asked to fast for labs.  Wallace Cullens, PharmD, Para March, CPP Clinical Pharmacist [facility] [phone]

## 2024-07-11 NOTE — Progress Notes (Signed)
" ° °  07/11/2024  Patient ID: Nicholas Black, male   DOB: 04/22/1956, 68 y.o.   MRN: [id number]  Reach patient by telephone today as scheduled. However, patient states that he has not yet contacted his insurance as planned in order to receive a copy of his Medicare prescription card for 2026 for our review today. Requests that we speak again on Monday to give him time to obtain this information.  Patient confirms that he picked up Ozempic  0.5 mg sample from office. Confirms understanding to take Ozempic  0.5 mg weekly for now.  Note if Ozempic  not affordable through patient's Medicare prescription plan in 2026, patient assistance program for therapeutic alternative, Trulicity, is now open through [employer].  - Today provide patient with information regarding income limits/criteria for this program.  Follow Up Plan: Clinical Pharmacist will follow up with patient by telephone on 07/14/2024 at 11:30 AM   Sharyle Sia, PharmD, [name], CPP Clinical Pharmacist [facility] [phone]  "

## 2024-07-14 ENCOUNTER — Other Ambulatory Visit

## 2024-07-18 ENCOUNTER — Other Ambulatory Visit

## 2024-07-18 ENCOUNTER — Telehealth: Payer: Self-pay | Admitting: Pharmacist

## 2024-07-18 NOTE — Progress Notes (Signed)
" ° °  Outreach Note  07/18/2024 Name: Nicholas Black MRN: [id number] DOB: 10-29-1955  Referred by: Edman Marsa PARAS, DO  Was unable to reach patient via telephone today and unable to leave a message as voicemail is not setup   Sharyle Sia, PharmD, [name], CPP Clinical Pharmacist [facility] [phone]     "

## 2024-07-18 NOTE — Telephone Encounter (Signed)
 Patient returned Elizabeth's call. Requesting info on what type of insurance program he has with [employer] and he has premier plus. Just wanted to let her know.

## 2024-07-25 ENCOUNTER — Telehealth: Payer: Self-pay

## 2024-07-25 ENCOUNTER — Other Ambulatory Visit: Admitting: Pharmacist

## 2024-07-25 DIAGNOSIS — I10 Essential (primary) hypertension: Secondary | ICD-10-CM

## 2024-07-25 DIAGNOSIS — E1169 Type 2 diabetes mellitus with other specified complication: Secondary | ICD-10-CM

## 2024-07-25 DIAGNOSIS — Z7985 Long-term (current) use of injectable non-insulin antidiabetic drugs: Secondary | ICD-10-CM

## 2024-07-25 NOTE — Patient Instructions (Signed)
Goals Addressed             This Visit's Progress    Pharmacy Goals       Our goal A1c is less than 7%. This corresponds with fasting sugars less than 130 and 2 hour after meal sugars less than 180. Please keep a log of your results when checking your blood sugar   Our goal bad cholesterol, or LDL, is less than 70 . This is why it is important to continue taking your pravastatin.  Check your blood pressure twice weekly, and any time you have concerning symptoms like headache, chest pain, dizziness, shortness of breath, or vision changes.   Our goal is less than 130/80.  To appropriately check your blood pressure, make sure you do the following:  1) Avoid caffeine, exercise, or tobacco products for 30 minutes before checking. Empty your bladder. 2) Sit with your back supported in a flat-backed chair. Rest your arm on something flat (arm of the chair, table, etc). 3) Sit still with your feet flat on the floor, resting, for at least 5 minutes.  4) Check your blood pressure. Take 1-2 readings.  5) Write down these readings and bring with you to any provider appointments.  Bring your home blood pressure machine with you to a provider's office for accuracy comparison at least once a year.   Make sure you take your blood pressure medications before you come to any office visit, even if you were asked to fast for labs.  Wallace Cullens, PharmD, Para March, CPP Clinical Pharmacist [facility] [phone]

## 2024-07-25 NOTE — Progress Notes (Signed)
 "  07/25/2024 Name: Nicholas Black MRN: [id number] DOB: January 22, 1956  Chief Complaint  Patient presents with   Medication Management   Medication Assistance    Nicholas Black is a 69 y.o. year old male who presented for a telephone visit.   They were referred to the pharmacist by their PCP for assistance in managing diabetes, hypertension, hyperlipidemia, and medication access.      Subjective:   Care Team: Primary Care Provider: Edman Marsa PARAS, DO ; Next Scheduled Visit: 07/31/2024 Urology: Twylla Glendia BROCKS, MD  Medication Access/Adherence  Current Pharmacy:  [facility] [address] ([address]), [place] - [address] [place]) [place] [address] Phone: [phone] Fax: [phone]   Patient reports affordability concerns with their medications: No  Patient reports access/transportation concerns to their pharmacy: No  Patient reports adherence concerns with their medications:  No   Patient uses weekly pillbox  Today patient shares that he contacted his insurance and his Medicare plan for 2026 is [employer] Signature Extra    Diabetes:   Current medications:  Ozempic  0.5 mg weekly (decreased to current dose as taking from sample/out of previous supply from patient assistance program) Reports currently has ~3 weeks supply remaining    Medications tried in the past: metformin  ER   Denies checking home blood sugar recently   Statin: pravastatin  40 mg daily   Current physical activity: attends spin class for 45 minutes x 3-4 days/week   Current medication access support: None - Previously enrolled in patient assistance for Ozempic  from [employer], but program ended 07/23/2024    Hypertension:   Current medications:  - amlodipine  10 mg daily - losartan  50 mg daily   Patient has upper arm blood pressure monitor at home, but denies monitoring home blood pressure recently   Patient denies hypotensive s/sx  including dizziness, lightheadedness.    Current physical activity: attends spin class for 45 minutes x 3-4 days/week   Objective:  Lab Results  Component Value Date   HGBA1C 6.2 (H) 01/29/2024    Lab Results  Component Value Date   CREATININE 0.90 01/29/2024   BUN 15 01/29/2024   NA 143 01/29/2024   K 4.4 01/29/2024   CL 105 01/29/2024   CO2 30 01/29/2024    Lab Results  Component Value Date   CHOL 91 01/29/2024   HDL 65 01/29/2024   LDLCALC 12 01/29/2024   TRIG 64 01/29/2024   CHOLHDL 1.4 01/29/2024   BP Readings from Last 3 Encounters:  06/29/24 (!) 144/79  01/29/24 128/80  10/12/23 133/86   Pulse Readings from Last 3 Encounters:  06/29/24 75  01/29/24 75  10/12/23 89     Medications Reviewed Today     Reviewed by Alana Sharyle LABOR, RPH-CPP (Pharmacist) on 07/25/24 at 1257  Med List Status: <None>   Medication Order Taking? Sig Documenting Provider Last Dose Status Informant  amLODipine  (NORVASC ) 10 MG tablet [id number] Yes Take 1 tablet (10 mg total) by mouth daily. Edman Marsa PARAS, DO  Active   BAYER ASPIRIN EC LOW DOSE 81 MG tablet [id number]  1 tablet (81 mg total).  Patient not taking: Reported on 01/29/2024   Edman Marsa PARAS, DO  Active   Blood Glucose Monitoring Suppl DEVI [id number]  1 each by Does not apply route in the morning, at noon, and at bedtime. May substitute to any manufacturer covered by patient's insurance. Floy Roberts, MD  Active   finasteride  (PROSCAR ) 5 MG tablet  [id number]  Take 1 tablet (5 mg total) by mouth daily. Edman Marsa PARAS, DO  Active   losartan  (COZAAR ) 50 MG tablet [id number] Yes Take 1 tablet (50 mg total) by mouth daily. Edman Marsa PARAS, DO  Active   OZEMPIC , 1 MG/DOSE, 4 MG/3ML SOPN [id number] Yes Inject 1 mg into the skin once a week.  Patient taking differently: Inject 0.5 mg into the skin once a week.   Edman Marsa PARAS, DO  Active   pravastatin  (PRAVACHOL ) 40 MG tablet  [id number]  Take 1 tablet (40 mg total) by mouth daily. Edman Marsa PARAS, DO  Active               Assessment/Plan:   Encourage patient to continue using weekly pillbox as adherence aid   Patient to contact [facility] [phone]) to reschedule missed Coronary Calcium Score Cardiac CT Scan as ordered by PCP   Have encouraged patient to call to schedule annual eye exam  Review with patient that per [facility] website, the [facility] Extra plan has a $500 annual prescription deductible (impacting tiers 3-5) and tier 3 copayment of 22% coinsurance (for medications such as Ozempic ) in 2026. Note out of pocket prescription drug cost in 2026 is capped at $2,100.    Diabetes: - Have reviewed long term cardiovascular and renal outcomes of uncontrolled blood sugar - Encourage patient to restart monitoring home glucose and have record of results to review during upcoming appointments  - Patient interested in switching from Ozempic  to Trulicity for affordability (patient assistance program available)             Based on reported income, patient meets criteria for Trulicity patient assistance from [employer] with PCP and CPhT to help patient with applying for Trulicity patient assistance   Hypertension: - Have reviewed long term cardiovascular and renal outcomes of uncontrolled blood pressure - Have reviewed appropriate blood pressure monitoring technique and reviewed goal blood pressure.  - Have recommend to limit consumption of salt/sodium - Recommended to check home blood pressure and heart rate and have record of results to review during upcoming appointments      Follow Up Plan: Clinical Pharmacist will follow up with patient by telephone on 09/01/2024 at 10:00 AM    Sharyle Sia, PharmD, [name], CPP Clinical Pharmacist [facility] [phone]   "

## 2024-07-25 NOTE — Telephone Encounter (Signed)
 PAP: Patient assistance application for Trulicity through [employer] has been mailed to pt's home address on file. Provider portion of application will be faxed to provider's office.

## 2024-07-29 NOTE — Telephone Encounter (Signed)
 Received Provider portion PAP application for trulicity [employer])

## 2024-07-31 ENCOUNTER — Ambulatory Visit: Admitting: Family Medicine

## 2024-08-04 ENCOUNTER — Encounter: Payer: Self-pay | Admitting: Family Medicine

## 2024-08-07 ENCOUNTER — Other Ambulatory Visit: Payer: Self-pay | Admitting: Family Medicine

## 2024-08-07 ENCOUNTER — Encounter: Payer: Self-pay | Admitting: Family Medicine

## 2024-08-07 ENCOUNTER — Ambulatory Visit: Admitting: Family Medicine

## 2024-08-07 VITALS — BP 138/60 | HR 56 | Ht 69.0 in | Wt 190.4 lb

## 2024-08-07 DIAGNOSIS — J300 Vasomotor rhinitis: Secondary | ICD-10-CM

## 2024-08-07 DIAGNOSIS — E663 Overweight: Secondary | ICD-10-CM

## 2024-08-07 DIAGNOSIS — R972 Elevated prostate specific antigen [PSA]: Secondary | ICD-10-CM

## 2024-08-07 DIAGNOSIS — E1169 Type 2 diabetes mellitus with other specified complication: Secondary | ICD-10-CM

## 2024-08-07 DIAGNOSIS — Z0000 Encounter for general adult medical examination without abnormal findings: Secondary | ICD-10-CM

## 2024-08-07 DIAGNOSIS — Z7985 Long-term (current) use of injectable non-insulin antidiabetic drugs: Secondary | ICD-10-CM

## 2024-08-07 DIAGNOSIS — E785 Hyperlipidemia, unspecified: Secondary | ICD-10-CM | POA: Diagnosis not present

## 2024-08-07 DIAGNOSIS — N401 Enlarged prostate with lower urinary tract symptoms: Secondary | ICD-10-CM

## 2024-08-07 DIAGNOSIS — N138 Other obstructive and reflux uropathy: Secondary | ICD-10-CM

## 2024-08-07 DIAGNOSIS — I10 Essential (primary) hypertension: Secondary | ICD-10-CM

## 2024-08-07 LAB — POCT GLYCOSYLATED HEMOGLOBIN (HGB A1C): Hemoglobin A1C: 6 % — AB (ref 4.0–5.6)

## 2024-08-07 MED ORDER — OZEMPIC (0.25 OR 0.5 MG/DOSE) 2 MG/3ML ~~LOC~~ SOPN: 0.5000 mg | PEN_INJECTOR | SUBCUTANEOUS | Status: AC

## 2024-08-07 MED ORDER — IPRATROPIUM BROMIDE 0.06 % NA SOLN: 2.0000 | Freq: Four times a day (QID) | NASAL | 2 refills | Status: AC | PRN

## 2024-08-07 MED ORDER — AMLODIPINE BESYLATE 10 MG PO TABS: 10.0000 mg | ORAL_TABLET | Freq: Every day | ORAL | 3 refills | Status: AC

## 2024-08-07 NOTE — Progress Notes (Signed)
 "  Subjective:    Patient ID: Nicholas Black, male    DOB: 1956/07/09, 69 y.o.   MRN: [id number]  Nicholas Black is a 69 y.o. male presenting on 08/07/2024 for Diabetes and Hypertension   HPI  Discussed the use of AI scribe software for clinical note transcription with the patient, who gave verbal consent to proceed.  History of Present Illness   Nicholas Black is a 69 year old male with diabetes who presents for a routine follow-up and diabetic screening.   Rotator cuff tear and upcoming surgery - Managing a torn rotator cuff - Shoulder surgery scheduled for August 11, 2024 - Surgeon requested recent blood glucose levels, which were unavailable at the time  Rhinitis / Rhinorrhea - Persistent clear nasal drainage - Symptoms not affected by weather changes or eating habits - No prior treatments attempted   HYPERLIPIDEMIA: - Reports no concerns. Last lipid panel 2025, controlled LDL - Currently taking Pravastatin  40mg , tolerating well without side effects or myalgias   Prostate cancer surveillance - PSA monitored by urology - Next PSA check scheduled for March 2026   Tobacco use history - Former smoker; quit in 2016 after smoking one pack per day for twenty years   CHRONIC DM, Type 2: - Plans to visit [facility] for diabetic eye examination - Currently on Ozempic  0.5mg  remaining supply, no longer on PAP, will need sample to avoid running out until Trulicity PAP completed - Working with a pharmacist to manage medication costs  -A1c improved 6.2 down to 6.0 Meds: Ozempic  0.5mg  weekly inj - Off Metformin   Reports  good compliance. Tolerating well w/o side-effects Currently on ARB - Attends spin classes six days per week Denies hypoglycemia, polyuria, visual changes   CHRONIC HTN: Home BP readings controlled. Today normal BP Current Meds - Amlodipine  10mg  daily, Losartan  50mg  daily Tolerating well, w/o complaints. Denies HA, chest pain, syncope, lightheaded, dizzy    BPH, LUTS Elevated PSA Doing well on Finasteride  5mg  Followed by [facility] Upcoming lab   [facility] Generalized Anxiety with Panic  Right Shoulder Rotator Cuff Repair Dr Marchia Monday 08/11/24     Health Maintenance:   Colon CA Screening: Never had colonoscopy or screening. Currently asymptomatic. No known family history of colon CA. Completed Colonoscopy 02/10/20 - next due 2026 (5 years)   Prostate CA Screening: PSA elevated previuosly, note on finasteride     Considering CT screening / Need Lung Screening, Former smoker 1ppd 20 years, quit 2016   Shingrix  completed 2 doses.       04/10/2024   10:31 AM 04/05/2023   12:07 PM 10/20/2022    1:51 PM  Depression screen PHQ 2/9  Decreased Interest 0 0 0  Down, Depressed, Hopeless 0 0 0  PHQ - 2 Score 0 0 0  Altered sleeping  0   Tired, decreased energy  0   Change in appetite  0   Feeling bad or failure about yourself   0   Trouble concentrating  0   Moving slowly or fidgety/restless  0   Suicidal thoughts  0   PHQ-9 Score  0    Difficult doing work/chores  Not difficult at all      Data saved with a previous flowsheet row definition       10/02/2022    2:58 PM 09/28/2022    9:52 AM 03/28/2022   10:30 AM 01/17/2021   10:57 AM  GAD 7 : Generalized Anxiety Score  Nervous, Anxious, on  Edge 1 1 0 1  Control/stop worrying 0 0 0 2  Worry too much - different things 1 0 0 1  Trouble relaxing 0 0 1 2  Restless 0 0 1 2  Easily annoyed or irritable 0 1 1 2   Afraid - awful might happen 0 1 0 1  Total GAD 7 Score 2 3 3 11   Anxiety Difficulty Not difficult at all Not difficult at all Not difficult at all Not difficult at all    Social History[1]  Review of Systems Per HPI unless specifically indicated above     Objective:    BP 138/60 (BP Location: Left Arm, Patient Position: Sitting, Cuff Size: Normal)   Pulse (!) 56   Ht 5' 9 (1.753 m)   Wt 190 lb 6 oz (86.4 kg)   SpO2 99%   BMI 28.11 kg/m   Wt Readings from Last  3 Encounters:  08/07/24 190 lb 6 oz (86.4 kg)  06/29/24 180 lb (81.6 kg)  04/10/24 181 lb (82.1 kg)    Physical Exam Vitals and nursing note reviewed.  Constitutional:      General: He is not in acute distress.    Appearance: He is well-developed. He is not diaphoretic.     Comments: Well-appearing, comfortable, cooperative  HENT:     Head: Normocephalic and atraumatic.  Eyes:     General:        Right eye: No discharge.        Left eye: No discharge.     Conjunctiva/sclera: Conjunctivae normal.  Neck:     Thyroid: No thyromegaly.  Cardiovascular:     Rate and Rhythm: Normal rate and regular rhythm.     Pulses: Normal pulses.     Heart sounds: Normal heart sounds. No murmur heard. Pulmonary:     Effort: Pulmonary effort is normal. No respiratory distress.     Breath sounds: Normal breath sounds. No wheezing or rales.  Musculoskeletal:        General: Normal range of motion.     Cervical back: Normal range of motion and neck supple.  Lymphadenopathy:     Cervical: No cervical adenopathy.  Skin:    General: Skin is warm and dry.     Findings: No erythema or rash.  Neurological:     Mental Status: He is alert and oriented to person, place, and time. Mental status is at baseline.  Psychiatric:        Behavior: Behavior normal.     Comments: Well groomed, good eye contact, normal speech and thoughts     Results for orders placed or performed in visit on 08/07/24  POCT glycosylated hemoglobin (Hb A1C)   Collection Time: 08/07/24 10:52 AM  Result Value Ref Range   Hemoglobin A1C 6.0 (A) 4.0 - 5.6 %   HbA1c POC (<> result, manual entry)     HbA1c, POC (prediabetic range)     HbA1c, POC (controlled diabetic range)        Assessment & Plan:   Problem List Items Addressed This Visit     BPH with obstruction/lower urinary tract symptoms   Essential hypertension   Relevant Medications   amLODipine  (NORVASC ) 10 MG tablet   Hyperlipidemia associated with type 2 diabetes  mellitus (HCC)   Relevant Medications   Semaglutide ,0.25 or 0.5MG /DOS, (OZEMPIC , 0.25 OR 0.5 MG/DOSE,) 2 MG/3ML SOPN   amLODipine  (NORVASC ) 10 MG tablet   Overweight (BMI 25.0-29.9)   Type 2 diabetes mellitus with other specified complication (  HCC) - Primary   Relevant Medications   Semaglutide ,0.25 or 0.5MG /DOS, (OZEMPIC , 0.25 OR 0.5 MG/DOSE,) 2 MG/3ML SOPN   Other Relevant Orders   Ambulatory referral to Optometry   POCT glycosylated hemoglobin (Hb A1C) (Completed)   Other Visit Diagnoses       Long-term current use of injectable noninsulin antidiabetic medication         Elevated PSA, less than 10 ng/ml         Vasomotor rhinitis       Relevant Medications   ipratropium (ATROVENT ) 0.06 % nasal spray        Type 2 diabetes mellitus with other specified complication = Hyperlipidemia Diabetes well-controlled, A1c at 6.0. Cleared for surgery. - Continue Ozempic  0.5 mg weekly. - Provided sample of Ozempic  0.5mg  for upcoming week (4 weeks total) - Work with [facility] team for Trulicity PAP in future if approved instead due to no longer on Ozempic  PAP - Referred to [facility] for diabetic eye exam.  Essential hypertension Blood pressure well-controlled at 138/60 mmHg. - Continue current antihypertensive regimen.  Elevated prostate specific antigen Followed by [facility], has apt upcoming PSA levels fluctuating, monitoring continues. - Continue monitoring PSA levels with urologist follow-up in March.  Overweight Weight management ongoing, BMI below 30, weight decreased to 180s-190s. - Continue current weight management strategies.  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 - Prescribed Atrovent  nasal spray for quick relief of symptoms. - Consider nasal steroid spray if symptoms persist.       Upcoming shoulder surgery R side on Monday   Orders Placed This Encounter  Procedures   Ambulatory referral to Optometry     Referral Priority:   Routine    Referral Type:   V[employer] Training [employer] Development Officer)    Referral Reason:   Specialty Services Required    Requested Specialty:   Optometry    Number of Visits Requested:   1   POCT glycosylated hemoglobin (Hb A1C)    Meds ordered this encounter  Medications   Semaglutide ,0.25 or 0.5MG /DOS, (OZEMPIC , 0.25 OR 0.5 MG/DOSE,) 2 MG/3ML SOPN    Sig: Inject 0.5 mg as directed once a week.   amLODipine  (NORVASC ) 10 MG tablet    Sig: Take 1 tablet (10 mg total) by mouth daily.    Dispense:  90 tablet    Refill:  3    Add extra refills to future   ipratropium (ATROVENT ) 0.06 % nasal spray    Sig: Place 2 sprays into both nostrils 4 (four) times daily as needed for rhinitis.    Dispense:  15 mL    Refill:  2    Follow up plan: Return in about 6 months (around 02/04/2025) for 6 month fasting lab > 1 week later Annual Physical.  Future labs ordered for 02/04/25  Marsa Officer, DO [facility] 08/07/2024, 10:34 AM     [1]  Social History Tobacco Use   Smoking status: Former    Current packs/day: 0.00    Average packs/day: 1 pack/day for 20.0 years (20.0 ttl pk-yrs)    Types: Cigarettes    Start date: 70    Quit date: 2016    Years since quitting: 10.0   Smokeless tobacco: Former  Building Services Engineer status: Never Used  Substance Use Topics   Alcohol use: Yes    Alcohol/week: 3.0 standard drinks of alcohol    Types: 3 Cans of beer per  week    Comment: occ   Drug use: Never   "

## 2024-08-07 NOTE — Patient Instructions (Addendum)
 Thank you for coming to the office today.  Keep on Ozempic  0.5mg  weekly, sample today will last 4 weeks, and then we can coordinate with [facility] to order more in future.  Recent Labs    09/18/23 0851 01/29/24 1425 08/07/24 1052  HGBA1C 6.4* 6.2* 6.0*   Cleared for surgery  Referral sent to [facility] - Diabetic Eye Exam is needed and tell them we are your doctor's office  [facility]   Address: [address] [place], [place] [address] Phone: [phone]  Website: [email or web address]  Please schedule a Follow-up Appointment to: Return in about 6 months (around 02/04/2025) for 6 month fasting lab > 1 week later Annual Physical.  If you have any other questions or concerns, please feel free to call the office or send a message through MyChart. You may also schedule an earlier appointment if necessary.  Additionally, you may be receiving a survey about your experience at our office within a few days to 1 week by e-mail or mail. We value your feedback.  Marsa Officer, DO [facility], [place]

## 2024-08-11 NOTE — Telephone Encounter (Signed)
 Reached out regarding PAP application for Trulicity.  Patient has no v/m set up on either home or mobile phone #.

## 2024-09-01 ENCOUNTER — Other Ambulatory Visit

## 2024-10-13 ENCOUNTER — Other Ambulatory Visit

## 2024-10-17 ENCOUNTER — Ambulatory Visit: Admitting: Urology

## 2025-02-04 ENCOUNTER — Other Ambulatory Visit

## 2025-02-11 ENCOUNTER — Encounter: Admitting: Family Medicine

## 2025-04-24 ENCOUNTER — Ambulatory Visit
